# Patient Record
Sex: Female | Born: 1945 | Race: Asian | Hispanic: No | Marital: Married | Smoking: Never smoker
Health system: Southern US, Community
[De-identification: ages and names within clinical notes are randomized; demographics above are authoritative.]

## PROBLEM LIST (undated history)

## (undated) DIAGNOSIS — H2100 Hyphema, unspecified eye: Secondary | ICD-10-CM

## (undated) DIAGNOSIS — K7460 Unspecified cirrhosis of liver: Secondary | ICD-10-CM

## (undated) DIAGNOSIS — I4819 Other persistent atrial fibrillation: Secondary | ICD-10-CM

## (undated) DIAGNOSIS — B181 Chronic viral hepatitis B without delta-agent: Secondary | ICD-10-CM

## (undated) DIAGNOSIS — B1910 Unspecified viral hepatitis B without hepatic coma: Secondary | ICD-10-CM

## (undated) DIAGNOSIS — M810 Age-related osteoporosis without current pathological fracture: Secondary | ICD-10-CM

## (undated) DIAGNOSIS — L237 Allergic contact dermatitis due to plants, except food: Secondary | ICD-10-CM

## (undated) DIAGNOSIS — M1990 Unspecified osteoarthritis, unspecified site: Secondary | ICD-10-CM

## (undated) DIAGNOSIS — D219 Benign neoplasm of connective and other soft tissue, unspecified: Secondary | ICD-10-CM

## (undated) DIAGNOSIS — R7303 Prediabetes: Secondary | ICD-10-CM

## (undated) HISTORY — PX: RADIOFREQUENCY ABLATION: SHX2290

## (undated) HISTORY — DX: Chronic viral hepatitis B without delta-agent: B18.1

## (undated) HISTORY — DX: Allergic contact dermatitis due to plants, except food: L23.7

## (undated) HISTORY — DX: Hyphema, unspecified eye: H21.00

## (undated) HISTORY — DX: Unspecified cirrhosis of liver: K74.60

## (undated) HISTORY — DX: Unspecified viral hepatitis B without hepatic coma: B19.10

## (undated) HISTORY — DX: Age-related osteoporosis without current pathological fracture: M81.0

## (undated) HISTORY — DX: Prediabetes: R73.03

## (undated) HISTORY — DX: Benign neoplasm of connective and other soft tissue, unspecified: D21.9

---

## 2000-04-02 ENCOUNTER — Other Ambulatory Visit: Admission: RE | Admit: 2000-04-02 | Discharge: 2000-04-02 | Payer: Self-pay | Admitting: Obstetrics and Gynecology

## 2001-03-18 ENCOUNTER — Other Ambulatory Visit: Admission: RE | Admit: 2001-03-18 | Discharge: 2001-03-18 | Payer: Self-pay | Admitting: Obstetrics and Gynecology

## 2002-07-19 ENCOUNTER — Other Ambulatory Visit: Admission: RE | Admit: 2002-07-19 | Discharge: 2002-07-19 | Payer: Self-pay | Admitting: Obstetrics and Gynecology

## 2003-07-27 ENCOUNTER — Other Ambulatory Visit: Admission: RE | Admit: 2003-07-27 | Discharge: 2003-07-27 | Payer: Self-pay | Admitting: Obstetrics and Gynecology

## 2004-01-11 ENCOUNTER — Other Ambulatory Visit: Admission: RE | Admit: 2004-01-11 | Discharge: 2004-01-11 | Payer: Self-pay | Admitting: Obstetrics and Gynecology

## 2005-01-21 ENCOUNTER — Other Ambulatory Visit: Admission: RE | Admit: 2005-01-21 | Discharge: 2005-01-21 | Payer: Self-pay | Admitting: *Deleted

## 2006-04-15 ENCOUNTER — Other Ambulatory Visit: Admission: RE | Admit: 2006-04-15 | Discharge: 2006-04-15 | Payer: Self-pay | Admitting: Obstetrics and Gynecology

## 2007-04-21 ENCOUNTER — Other Ambulatory Visit: Admission: RE | Admit: 2007-04-21 | Discharge: 2007-04-21 | Payer: Self-pay | Admitting: Obstetrics and Gynecology

## 2007-08-06 ENCOUNTER — Ambulatory Visit: Payer: Self-pay | Admitting: Internal Medicine

## 2007-11-19 ENCOUNTER — Ambulatory Visit: Payer: Self-pay | Admitting: Internal Medicine

## 2008-05-23 ENCOUNTER — Ambulatory Visit: Payer: Self-pay | Admitting: Internal Medicine

## 2008-05-24 ENCOUNTER — Other Ambulatory Visit: Admission: RE | Admit: 2008-05-24 | Discharge: 2008-05-24 | Payer: Self-pay | Admitting: Obstetrics and Gynecology

## 2008-05-31 ENCOUNTER — Ambulatory Visit: Payer: Self-pay

## 2009-12-31 DIAGNOSIS — R5383 Other fatigue: Secondary | ICD-10-CM

## 2009-12-31 DIAGNOSIS — R5381 Other malaise: Secondary | ICD-10-CM

## 2009-12-31 DIAGNOSIS — I4891 Unspecified atrial fibrillation: Secondary | ICD-10-CM

## 2010-01-01 ENCOUNTER — Ambulatory Visit: Payer: Self-pay | Admitting: Internal Medicine

## 2010-01-03 ENCOUNTER — Encounter: Payer: Self-pay | Admitting: Internal Medicine

## 2010-01-10 ENCOUNTER — Encounter: Payer: Self-pay | Admitting: Internal Medicine

## 2010-01-10 ENCOUNTER — Ambulatory Visit (HOSPITAL_COMMUNITY): Admission: RE | Admit: 2010-01-10 | Discharge: 2010-01-10 | Payer: Self-pay | Admitting: Internal Medicine

## 2010-01-10 ENCOUNTER — Ambulatory Visit: Payer: Self-pay

## 2010-01-10 ENCOUNTER — Ambulatory Visit: Payer: Self-pay | Admitting: Internal Medicine

## 2010-01-15 ENCOUNTER — Telehealth: Payer: Self-pay | Admitting: Internal Medicine

## 2010-01-22 ENCOUNTER — Telehealth (INDEPENDENT_AMBULATORY_CARE_PROVIDER_SITE_OTHER): Payer: Self-pay | Admitting: *Deleted

## 2010-01-23 ENCOUNTER — Ambulatory Visit: Payer: Self-pay | Admitting: Internal Medicine

## 2010-01-29 ENCOUNTER — Encounter: Payer: Self-pay | Admitting: Internal Medicine

## 2010-01-29 ENCOUNTER — Ambulatory Visit: Payer: Self-pay

## 2010-01-30 ENCOUNTER — Ambulatory Visit: Payer: Self-pay | Admitting: Internal Medicine

## 2010-01-30 ENCOUNTER — Ambulatory Visit (HOSPITAL_COMMUNITY): Admission: RE | Admit: 2010-01-30 | Discharge: 2010-01-30 | Payer: Self-pay | Admitting: Internal Medicine

## 2010-02-22 ENCOUNTER — Ambulatory Visit: Payer: Self-pay | Admitting: Internal Medicine

## 2010-03-14 ENCOUNTER — Encounter: Admission: RE | Admit: 2010-03-14 | Discharge: 2010-03-14 | Payer: Self-pay | Admitting: Gastroenterology

## 2010-06-20 ENCOUNTER — Ambulatory Visit: Payer: Self-pay | Admitting: Gastroenterology

## 2010-07-05 ENCOUNTER — Telehealth: Payer: Self-pay | Admitting: Internal Medicine

## 2010-08-20 ENCOUNTER — Ambulatory Visit: Payer: Self-pay | Admitting: Internal Medicine

## 2010-09-12 ENCOUNTER — Ambulatory Visit (HOSPITAL_COMMUNITY): Admission: RE | Admit: 2010-09-12 | Discharge: 2010-09-12 | Payer: Self-pay | Admitting: Gastroenterology

## 2010-09-19 ENCOUNTER — Ambulatory Visit: Payer: Self-pay | Admitting: Gastroenterology

## 2010-12-08 LAB — CONVERTED CEMR LAB
BUN: 21 mg/dL (ref 6–23)
Basophils Relative: 0.1 % (ref 0.0–3.0)
Chloride: 103 meq/L (ref 96–112)
Eosinophils Relative: 0.6 % (ref 0.0–5.0)
GFR calc non Af Amer: 89.4 mL/min (ref >60)
HCT: 41.4 % (ref 36.0–46.0)
Hemoglobin: 14 g/dL (ref 12.0–15.0)
Lymphs Abs: 0.7 10*3/uL (ref 0.7–4.0)
MCV: 103.1 fL — ABNORMAL HIGH (ref 78.0–100.0)
Monocytes Absolute: 0.3 10*3/uL (ref 0.1–1.0)
Monocytes Relative: 10.1 % (ref 3.0–12.0)
Neutro Abs: 2.4 10*3/uL (ref 1.4–7.7)
Platelets: 170 10*3/uL (ref 150.0–400.0)
Potassium: 3.8 meq/L (ref 3.5–5.1)
RBC: 4.01 M/uL (ref 3.87–5.11)
Sodium: 142 meq/L (ref 135–145)
WBC: 3.4 10*3/uL — ABNORMAL LOW (ref 4.5–10.5)

## 2010-12-10 NOTE — Progress Notes (Signed)
Summary: PT RETURNING CALL  Phone Note Call from Patient Call back at [facility] Phone [phone]   Caller: Patient Summary of Call: PT RETURNING CALL  ABOUT TSH 0.982 Initial call taken by: Judie Grieve,  January 15, 2010 12:04 PM  Follow-up for Phone Call        She feels like the Atenolol is working for her but she feels like her HR is still fast at times. She cannot check it. I will set her up for a Nurse Visit on 3/16 (same day as her lab work) for an EKG. The nurse at that time can s/w Dr. Graciela Husbands to see if we need to increase her Atenolol dose. Follow-up by: Duncan Dull, RN, BSN,  January 15, 2010 12:27 PM

## 2010-12-10 NOTE — Progress Notes (Signed)
Summary: refill  Phone Note Refill Request Call back at Home Phone [phone] Call back at [phone]- hubsand work #    Refills Requested: Medication #1:  ATENOLOL 25 MG TABS once daily   [facility] [employer]   Method Requested: Electronic Initial call taken by: Migdalia Dk,  January 15, 2010 10:05 AM    Prescriptions: ATENOLOL 25 MG TABS (ATENOLOL) once daily  #30 x 1   Entered by:   Judithe Modest CMA   Authorized by:   Nathen May, MD, [facility]   Signed by:   Judithe Modest CMA on 01/15/2010   Method used:   Electronically to        [facility] [address] [phone]* (retail)       [address]       [place], [place]  [address]       Ph: [phone] or [phone]       Fax: [phone]   RxID:   [id number]

## 2010-12-10 NOTE — Progress Notes (Signed)
  Spoke with Pt. this Morning she has signed ROI, and will be here tomorrow for her appt and to also pick up a copy of her Echo, I told her it will be @ the [facility] when she checks in  [facility]  January 22, 2010 8:42 AM

## 2010-12-10 NOTE — Assessment & Plan Note (Signed)
Summary: EKG (427.31)  Nurse Visit   Vital Signs:  Patient profile:   65 year old female Pulse rate:   76 / minute  Vitals Entered By: Julieta Gutting, RN, BSN (January 23, 2010 9:40 AM)  Impression & Recommendations:  Problem # 1:  ATRIAL FIBRILLATION (ICD-427.31) Pt came into the office today for an EKG.  The pt was started on Atenolol 25mg  once a day and EKG was done to assess heart rate.  EKG shows AFIB with rate of 76.  I instructed the pt to continue Atenolol at this time.  A long-term Rx was called into the pharmacy #30, refill x6.  Julieta Gutting, RN, BSN  January 23, 2010 9:41 AM    Allergies: No Known Drug Allergies

## 2010-12-10 NOTE — Assessment & Plan Note (Signed)
Summary: eph/ gd   Primary Provider:  Dr Perrin Maltese  CC:  eph/cardioversion.  History of Present Illness:   Mrs. Connie Chase is seen in followup for paroxysmal atrial fibrillation. She also has a history of orthostatic intolerance.      she recently underwent cardioversion and she feels much better. He has improved energy.   thromboembolic risk factors are notable for gender and hypertension  and bordrline age  Current Medications (verified): 1)  Multivitamins   Tabs (Multiple Vitamin) .Marland Kitchen.. 1 Tab Once Daily 2)  I Caps .Marland Kitchen.. 1 Tab Once Daily 3)  Vitamin C 500 Mg  Tabs (Ascorbic Acid) .Marland Kitchen.. 1 Tab Bid 4)  Magnesium 250mg  .... 1 Once Daily 5)  Fish Oil   Oil (Fish Oil) .Marland Kitchen.. 1200 Mg 1 Tab Daily 6)  Atenolol 25 Mg Tabs (Atenolol) .... Once Daily 7)  Pradaxa 150 Mg Caps (Dabigatran Etexilate Mesylate) .... Two Times A Day  Allergies (verified): No Known Drug Allergies  Past History:  Past Medical History: Last updated: Jan 24, 2010 Current Problems:  ATRIAL FIBRILLATION (ICD-427.31) FATIGUE (ICD-780.79) RIGHT LOWER EXTREMITY EDEMA  Past Surgical History: Last updated: 24-Jan-2010 None  Family History: Last updated: 01-24-10 Father died at age 57 ruptured appendix  Social History: Last updated: 02/20/2010  She does not use cigarettes,  alcohol or recreational drugs.  She exercises     Vital Signs:  Patient profile:   65 year old female Height:      63 inches Weight:      99 pounds BMI:     17.60 Pulse rate:   64 / minute Pulse rhythm:   regular BP sitting:   96 / 64  (left arm) Cuff size:   regular  Vitals Entered By: Judithe Modest CMA (February 22, 2010 10:10 AM)  Physical Exam  General:  The patient was alert and oriented in no acute distress. HEENT Normal.  Neck veins were flat, carotids were brisk.  Lungs were clear.  Heart sounds were regular without murmurs or gallops.  Abdomen was soft with active bowel sounds. There is no clubbing cyanosis or edema. Skin Warm and  dry    EKG  Procedure date:  02/22/2010  Findings:      sinus rhythm at 64 Intervals 0.13/0.08/0.40 Axis XLVI   Impression & Recommendations:  Problem # 1:  ATRIAL FIBRILLATION (ICD-427.31) She is holding sinus rhythm. We spent a 5 minute visit discussing atrial fibrillation is risk of recurrence the role of thromboembolic risk reduction. She has borderline CHADS VASC score of 2-3 based on her history of hypertension and her age of 65. It is my recommendation that she stay on Pradaxa indefinitely. She can stop it as needed for procedures for example her dental work that is forthcoming. We will see her again in 6 months The following medications were removed from the medication list:    Aspirin 81 Mg Tbec (Aspirin) .Marland Kitchen... Take one tablet by mouth daily Her updated medication list for this problem includes:    Atenolol 25 Mg Tabs (Atenolol) ..... Once daily  Orders: EKG w/ Interpretation (93[address])  Patient Instructions: 1)  Your physician recommends that you continue on your current medications as directed. Please refer to the Current Medication list given to you today. 2)  Your physician wants you to follow-up in:  6 months with Dr Graciela Husbands. You will receive a reminder letter in the mail two months in advance. If you don't receive a letter, please call our office to schedule the follow-up appointment.

## 2010-12-10 NOTE — Assessment & Plan Note (Signed)
Summary: f2y/ gd   Visit Type:  Follow-up 2years Primary Provider:  Dr Perrin Maltese  CC:  palpitations.  History of Present Illness:   Connie Chase is seen in followup for paroxysmal atrial fibrillation. She also has a history of orthostatic intolerance.  she thinks that she has been out of rhythm for about 6 months. This is associated with fatigue and some impairment in exercise tolerance. She has also had problems with exercise associated lightheadedness with at least on one occasion on the treadmill having to come off because "her legs got tired but she got very lightheaded.  thromboembolic risk factors are notable for gender and hypertension. Previous assessments of left ventricular function were normal  from old records.   Current Medications (verified): 1)  Aspirin 81 Mg Tbec (Aspirin) .... Take One Tablet By Mouth Daily 2)  Multivitamins   Tabs (Multiple Vitamin) .Marland Kitchen.. 1 Tab Once Daily 3)  I Caps .Marland Kitchen.. 1 Tab Once Daily 4)  Vitamin C 500 Mg  Tabs (Ascorbic Acid) .Marland Kitchen.. 1 Tab Bid 5)  Magnesium 250mg  .... 1 Once Daily 6)  Fish Oil   Oil (Fish Oil) .Marland Kitchen.. 1200 Mg 1 Tab 4 Times A Week  Allergies (verified): No Known Drug Allergies  Past History:  Past Medical History: Last updated: 01-01-10 Current Problems:  ATRIAL FIBRILLATION (ICD-427.31) FATIGUE (ICD-780.79) RIGHT LOWER EXTREMITY EDEMA  Past Surgical History: Last updated: 01-01-10 None  Family History: Last updated: 01/01/10 Father died at age 39 ruptured appendix  Social History: Last updated: 2010/01/01  She does not use cigarettes,  alcohol or recreational drugs.  She exercises,.      Vital Signs:  Patient profile:   65 year old female Height:      63 inches Weight:      101 pounds BMI:     17.96 Pulse rate:   88 / minute BP sitting:   115 / 68  (left arm) Cuff size:   large  Vitals Entered By: Burnett Kanaris, CNA (January 01, 2010 10:15 AM)  Physical Exam  General:  The patient was alert and oriented in  no acute distress.Neck veins were flat, carotids were brisk. Lungs were clear. Heart sounds were irregular without murmurs or gallops. Abdomen was soft with active bowel sounds. There is no clubbing cyanosis or edema.    EKG  Procedure date:  01/01/2010  Findings:      atrial fibrillation at 88 Intervals-/0.08/0.36 Axis is 21  Impression & Recommendations:  Problem # 1:  ATRIAL FIBRILLATION (ICD-427.31) the patient has recurrent atrial fibrillation that has now been persistent for what sounds like about 6 months. We will plan to undertake cardioversion. She'll need anticoagulation in anticipation of this and we discussed the role of Pradaxa. She would prefer that she is a big vegetable eater. We discussed the fact that there are no good reversing agent.  We will need to reassess her left ventricular function and left atrial size to understand what the treatment options would be for prevention of recurrent atrial fibrillation. We'll obtain a 2-D echo cardiogram. Her graft her atrial fibrillation rates are modestly rapid with 88 beats per minute at rest. While she has some symptoms with exertion I suspect this is related not to paroxysms but her rate. We will give her a prescription for atenolol 25 and Inderal LA 80 to see if we can find some rate controlling agent. She did not tolerate metoprolol in the past. Will also stop her aspirin. Her updated medication list for this problem  includes:    Aspirin 81 Mg Tbec (Aspirin) .Marland Kitchen... Take one tablet by mouth daily    Atenolol 25 Mg Tabs (Atenolol) ..... Once daily  Orders: Echocardiogram (Echo)  Patient Instructions: 1)  Your physician has recommended you make the following change in your medication: STOP ASPIRIN 2)   START ATENOLOL AND INDERAL AS INSTRUCTED. LET MELANIE, RN KNOW WHICH ONE WORKS FOR YOU SO WE CAN CALL IN REFILLS 3)  START PRADAXA 150MG  two times a day  4)  Your physician has requested that you have a Cardioversion IN 4 WEEKS.    Electrical cardioversion uses a jolt of electricity to your heart either through paddles or wired patches attached to your chest. This is a controlled, usually prescheduled, procedure. This procedure is done at the hospital and you are not awake during the procedure.  You usually go home the day of the procedure. Please see the instruction sheet given to you today for more information. 5)  THE DAY BEFORE YOUR CARDIOVERSION YOU NEED TO COME INTO THE OFFICE FOR A NURSE VISIT FOR AN EKG TO MAKE SURE YOU ARE STILL IN ATRIAL FIBRILLATION. 6)  You have been diagnosed with atrial fibrillation.  Atrial fibrillation is a condition in which one of the upper chambers of the heart has extra electrical cells causing it to beat very fast.  Please see the handout/brochure given to you today for further information. 7)  Your physician has requested that you have an echocardiogram.  Echocardiography is a painless test that uses sound waves to create images of your heart. It provides your doctor with information about the size and shape of your heart and how well your heart's chambers and valves are working.  This procedure takes approximately one hour. There are no restrictions for this procedure. Prescriptions: PRADAXA 150 MG CAPS (DABIGATRAN ETEXILATE MESYLATE) two times a day  #60 x 6   Entered by:   Duncan Dull, RN, BSN   Authorized by:   Nathen May, MD, [facility]   Signed by:   Duncan Dull, RN, BSN on 01/01/2010   Method used:   Electronically to        [facility] [address] [phone]* (retail)       [address]       [place], [place]  [address]       Ph: [phone] or [phone]       Fax: [phone]   RxID:   [id number] ATENOLOL 25 MG TABS (ATENOLOL) once daily  #30 x 0   Entered by:   Duncan Dull, RN, BSN   Authorized by:   Nathen May, MD, [facility]   Signed by:   Duncan Dull, RN, BSN on 01/01/2010   Method used:   Electronically to        [facility]   [facility] [address] [phone]* (retail)       [address]       [place], [place]  [address]       Ph: [phone] or [phone]       Fax: [phone]   RxID:   [id number]

## 2010-12-10 NOTE — Assessment & Plan Note (Signed)
Summary: 6 month return.amber   Primary Provider:  Dr Perrin Maltese   History of Present Illness:    Mrs. Helbig is seen in followup for paroxysmal atrial fibrillation. This had  been quiescient She underwent cardioversion earlier in the calendar year; inmprovement in symptoms was clear with restoration of sinus rhythm  She also has a history of orthostatic intolerance.         thromboembolic risk factors are notable for gender  and   age; mCHADS VASC score is 2  Current Medications (verified): 1)  Multivitamins   Tabs (Multiple Vitamin) .Marland Kitchen.. 1 Tab Once Daily 2)  I Caps .Marland Kitchen.. 1 Tab Once Daily 3)  Vitamin C 500 Mg  Tabs (Ascorbic Acid) .Marland Kitchen.. 1 Tab Bid 4)  Magnesium 250mg  .... 1 Once Daily 5)  Fish Oil   Oil (Fish Oil) .Marland Kitchen.. 1200 Mg 1 Tab Daily 6)  Pradaxa 150 Mg Caps (Dabigatran Etexilate Mesylate) .... Two Times A Day  Allergies (verified): No Known Drug Allergies  Past History:  Past Medical History: Last updated: 2010/01/24 Current Problems:  ATRIAL FIBRILLATION (ICD-427.31) FATIGUE (ICD-780.79) RIGHT LOWER EXTREMITY EDEMA  Past Surgical History: Last updated: 01/24/10 None  Family History: Last updated: 01-24-2010 Father died at age 11 ruptured appendix  Social History: Last updated: 02/20/2010  She does not use cigarettes,  alcohol or recreational drugs.  She exercises     Vital Signs:  Patient profile:   65 year old female Height:      63 inches Weight:      100 pounds BMI:     17.78 Pulse rate:   77 / minute Resp:     16 per minute BP sitting:   127 / 69  (right arm)  Vitals Entered By: Marrion Coy, CNA (August 20, 2010 4:06 PM)  Physical Exam  General:  The patient was alert and oriented in no acute distress. HEENT Normal.  Neck veins were flat, carotids were brisk.  Lungs were clear.  Heart sounds were regular without murmurs or gallops.  Abdomen was soft with active bowel sounds. There is no clubbing cyanosis or edema. Skin Warm and  dry    EKG  Procedure date:  08/20/2010  Findings:      sinus rhythm at 77 next uncontrolled 0.13/08/ 3 8 Axis is 83  Impression & Recommendations:  Problem # 1:  ATRIAL FIBRILLATION (ICD-427.31) the patient is no longer taking her atenolol. Her blood pressures have been well controlled.  She has not had any symptomatic atrial fibrillation. We had a lengthy greater than 25 minute discussion regarding atrial fibrillation treatment options and anticoagulation. With the CHADS VASC score of 2 her thromboembolic risks would be estimated to be approximately 2-3% putting her non low risk group.  we discussed the potential issue of anticoagulation discontinuation following ablation and the debated status of that conclusion The following medications were removed from the medication list:    Atenolol 25 Mg Tabs (Atenolol) ..... Once daily  Orders: EKG w/ Interpretation (93[address])  Patient Instructions: 1)  Your physician recommends that you continue on your current medications as directed. Please refer to the Current Medication list given to you today. 2)  Your physician wants you to follow-up in: 6 months  You will receive a reminder letter in the mail two months in advance. If you don't receive a letter, please call our office to schedule the follow-up appointment.

## 2010-12-10 NOTE — Progress Notes (Signed)
Summary: lab work  Phone Note Call from Patient Call back at [employer] [phone]   Caller: Patient Reason for Call: Talk to Nurse Summary of Call: TSH was last drawn in June 08- 0.982 normal range 0.35-5.5, having lab work due 3/16 Initial call taken by: Migdalia Dk,  January 15, 2010 10:03 AM  Follow-up for Phone Call        Called patient and left message on machine. Pt will call if she has any questions. S/W Victorino Dike and she got the impression that the pt was just calling to let Dr. Graciela Husbands know this information. No question per say.  Follow-up by: Duncan Dull, RN, BSN,  January 15, 2010 10:42 AM

## 2010-12-10 NOTE — Progress Notes (Signed)
Summary: pt needs 90day supply  Phone Note Refill Request Call back at Home Phone [phone] Message from:  [facility] [address]  Refills Requested: Medication #1:  PRADAXA 150 MG CAPS two times a day. 90days supply  Initial call taken by: Omer Jack,  July 05, 2010 9:25 AM  Follow-up for Phone Call       Follow-up by: Judithe Modest CMA,  July 05, 2010 9:59 AM    Prescriptions: PRADAXA 150 MG CAPS (DABIGATRAN ETEXILATE MESYLATE) two times a day  #180 x 3   Entered by:   Judithe Modest CMA   Authorized by:   Nathen May, MD, [facility]   Signed by:   Judithe Modest CMA on 07/05/2010   Method used:   Electronically to        [facility]  [phone]* (retail)       [address] [place], [place]  [address]       Ph: [phone] or [phone]       Fax: [phone]   RxID:   [id number] PRADAXA 150 MG CAPS (DABIGATRAN ETEXILATE MESYLATE) two times a day  #180 x 3   Entered by:   Judithe Modest CMA   Authorized by:   Nathen May, MD, [facility]   Signed by:   Judithe Modest CMA on 07/05/2010   Method used:   Electronically to        [facility]  [facility] [address] [phone]* (retail)       [address]       [place], [place]  [address]       Ph: [phone] or [phone]       Fax: [phone]   RxID:   [id number]

## 2010-12-10 NOTE — Letter (Signed)
Summary: Cardioversion/TEE Instructions  Architectural technologist, Main Office  [address]   [place], [place] [address]   Phone: [phone]  Fax: [phone]    Cardioversion Instructions  You are scheduled for a Cardioversion  on 01/30/10 with Dr. Graciela Husbands.   Please arrive at the [facility] at 0630 a.m.  on the day of your procedure.  1)   DIET:  A)   Nothing to eat or drink after midnight except your medications with a sip of water.   2)   Come to the [place] office on 01/23/10 for lab work. The lab at [facility] is open from 8:30 a.m. to 1:30 p.m. and 2:30 p.m. to 5:00 p.m. The lab at [facility] is open from 7:30 a.m. to 5:30 p.m. You do not have to be fasting.  3)   MAKE SURE YOU TAKE YOUR PRADAXA.  4)   A)   YOU MAY TAKE ALL of your remaining medications with a small amount of water.    5)  Must have a responsible person to drive you home.  6)   Bring a current list of your medications and current insurance cards.   * Special Note:  Every effort is made to have your procedure done on time. Occasionally there are emergencies that present themselves at the hospital that may cause delays. Please be patient if a delay does occur.  * If you have any questions after you get home, please call the office at [phone].

## 2010-12-26 ENCOUNTER — Ambulatory Visit: Payer: Medicare Other | Admitting: Gastroenterology

## 2010-12-26 DIAGNOSIS — B181 Chronic viral hepatitis B without delta-agent: Secondary | ICD-10-CM

## 2011-02-03 LAB — PROTIME-INR: INR: 1.15 (ref 0.00–1.49)

## 2011-02-03 LAB — APTT: aPTT: 38 seconds — ABNORMAL HIGH (ref 24–37)

## 2011-03-11 HISTORY — PX: CATARACT EXTRACTION W/ INTRAOCULAR LENS IMPLANT: SHX1309

## 2011-03-25 NOTE — Letter (Signed)
August 06, 2007    Connie Chase, M.D.  [address]  [place], [place] [address]   RE:  Connie, Chase  MRN:  [id number]  /  DOB:  [birth year]   Dear Connie Chase:   It was a pleasure to see Connie Chase at your request because of atrial  fibrillation.   As you know, she is a 65 year old [place] immigrant who is a married  mother of 2 who was found to be in atrial fibrillation at her annual  physical with Connie Chase in June.  She had been complaining of some  fatigue at that point.  She was referred to you.  Evaluation reveals  what is likely a typo in that her ejection fraction is described at 34%,  but her left ventricular systolic function is described as normal.  Her  echocardiogram demonstrated an ejection fraction of 50% to 55%.  There  was some suggestions of a PFO, but this was not confirmed by bubble  study.  She was begun on Coumadin with anticipation of cardioversion,  and she spontaneously reverted to sinus rhythm.   She was treated, in addition, with metoprolol that was associated with  orthostatic intolerance.  This was discontinued.  She was then treated  with Cardizem which was associated with severe, unremitting headache.   Her thromboembolic risk factors are negative for diabetes, hypertension,  congestive heart failure, prior stroke, and age.  While the chart  describes borderline diabetes, apparently her last nonfasting blood  sugar was 100.   She has no exercise intolerance.   The fatigue with which she presented to Connie Chase in the spring seemed  to abate somewhat as the summer went on.  It then worsened.  It is not  clear whether the medications have abrogated whatever benefit was  initially seen.   PAST MEDICAL HISTORY:  Notable, primarily, as above.  She has a history  of allergies.   PAST SURGICAL HISTORY:  Negative.   SOCIAL HISTORY:  As noted previously.  She does not use cigarettes,  alcohol or recreational drugs.  She exercises,.   She takes Coumadin and Cartia currently.  She has no known drug  allergies.   PHYSICAL EXAMINATION:  She is a middle-to-older Asian woman who looks  older than her stated age of 11.  Her blood pressure is 111/69, her pulse is 76, and there is no  orthostatic change.  Her HEENT exam demonstrated no icterus or xanthoma.  The neck veins were flat.  The carotids were brisk and full bilaterally  without bruits.  BACK:  Without kyphosis or scoliosis.  LUNGS:  Clear.  Heart sounds were regular with an early systolic murmur and an S4.  The abdomen was soft with active bowel sounds without midline pulsation  or hepatomegaly.  Her femoral pulses were 2+.  Distal pulses were intact.  There is no  clubbing, cyanosis, or edema.  The neurological exam was grossly normal.  Her skin was warm and dry, and she is wearing a pair of socks, one black  and one blue.   Electrocardiogram dated today demonstrated sinus rhythm at 76 with  intervals of 0.14/0.08/0.39.  Electrocardiogram was otherwise normal.   IMPRESSION:  1. Paroxysmal atrial fibrillation with a moderately rapid ventricular      response.  2. Orthostatic intolerance with beta blockers.  3. Severe headache with calcium blockers.  4. Thromboembolic risk factors broadly negative with a [place] score of      zero.  Connie Chase, Connie Chase has paroxysmal atrial fibrillation in the setting of  what appears to be a structurally normal heart and a [place] score of zero.  Given the symptoms associated with her medications and her concerns  about Coumadin, I think it is reasonable:  A.  To stop the Cardizem.  B.  To stop the Coumadin.   I have told her that with a [place] score of zero, it is reasonable to take  some dose of aspirin.  We do not know the proper dose, and she is  concerned about being petite, so I agreed with her that 162 mg was  reasonable.   I have given her a prescription for verapamil 40 to use as needed if she  were to have symptomatic  tachy palpitations, although this will be  difficult because on her event recorder when she was having  palpitations, for example, after eating dark chocolate, her monitor,  apparently, was normal.   We further discussed ablation, and I told her that consideration of  ablation at this point was premature.  In the event that her symptoms  become greater, antiarrhythmic drug therapy would be a reasonable thing  in this younger lady, and this would almost certainly be appropriate  prior to referral for a junction ablation.   Thank you for the consultation.    Sincerely,      Connie Salvia, MD, [facility]  Electronically Signed    SCK/MedQ  DD: 08/06/2007  DT: 08/07/2007  Job #: [id number]   CC:    Connie Chase, M.D.

## 2011-03-25 NOTE — Letter (Signed)
May 23, 2008    Duncan Dull, MD  [address]  [address]  [place], [place] [address]   RE:  Connie Chase, Connie Chase  MRN:  [id number]  /  DOB:  [birth year]   Dear Lupita Leash,   Connie Chase comes in today with multiple concerns.  She said that she was  found to be in atrial fibrillation when she was in your office last  week.  If you would not mind, please be kind enough to fax me a copy of  that ECG, that would be a big help.   She also is concerned about her lab work, which demonstrated mildly  elevated CRP and a mildly depressed white blood count.  She is having  follow up of this blood work.   She has episodes of fatigue.  She is not sure whether this correlates  with her paroxysms of atrial fibrillation or not.   Her other concern is swelling in her lower right leg.  You had ordered  venous Doppler; she cancelled it because she was not sure when she was  going overboard.  She now requests that that be repeated.   PHYSICAL EXAMINATION:  VITAL SIGNS:  Today, her blood pressure was  108/64 with a pulse of 86.  LUNGS:  Clear.  HEART:  Sounds were regular.  EXTREMITIES:  Without edema.  There was some mild swelling of the right  lower leg.   Electrocardiogram dated today demonstrated sinus rhythm at 86 with  intervals of 0.14/0.08/0.37.  The axis was 70 degrees.   IMPRESSION:  1. Paroxysmal atrial fibrillation.  2. Right lower extremity swelling.  3. Episodes of fatigue, question correlated with paroxysmal atrial      fibrillation.  4. Lab work demonstrated a mildly elevated CRP at 4.6 (ULN:3), HDL 99,      WBC 2.7.   PLAN:  Connie Chase atrial fibrillation is largely asymptomatic.  I think,  it may well be if we can confirm atrial fibrillation that an event  recorder would be useful to try to correlate her fatigue with her atrial  fibrillation.   We will get the venous Dopplers and I will forward the results of those  two.    Sincerely,      Duke Salvia, MD, [facility]  Electronically Signed    SCK/MedQ  DD: 05/23/2008  DT: 05/24/2008  Job #: [id number]

## 2011-03-25 NOTE — Letter (Signed)
November 19, 2007    Duncan Dull, M.D.  [address]  [address]  [place], [place] [address]   RE:  Connie Chase, Connie Chase  MRN:  [id number]  /  DOB:  06/25/1946   Dear Lupita Leash:   Happy new year to you!  Mrs. Pflieger comes back in followup for atrial  fibrillation.  There were some concerns that she had about the  cardiology office that prompted her to reschedule here, and I have asked  her to plan to talk with you about this in March.   The long and short of it is that she is much better off of her [place].  She is also off of the Coumadin as you know.  There is some discussion  about her aspirin dose, and I appreciate your getting her back on daily  aspirin and, for her, the dose of 81 versus 162 is a big deal, and I am  not sure whether it matters a whole lot with a [place] score of 0, so I  compromised and left her at 81 mg a day.   She is concerned about her blood pressure.  It was 130 the other day  somewhere, and it was 140 repeatedly here in the office today.  Her exam  was otherwise normal, and her heart rate was 74.   LUNGS:  Clear.  HEART:  Sounds are regular.  Rate was 74.  EXTREMITIES:  Normal.   IMPRESSION:  1. Atrial fibrillation.  2. Borderline blood pressure.   I did a quick literature review on aspirin and cinnamon, both of which  are new to her.  Neither one of them has been associated with  hypertension.  In fact, both have been associated with treatment of  hypertension.   There are data from [place] that irbesartan has been associated with  decreased paroxysms of atrial fibrillation, so if she needs hypertensive  therapy, I would probably choose irbesartan.  Pending her discussions  with you about long-term followup, I have set her up to see me in about  6 months, which is written in pencil.  If there is anything else I can  do, please do not hesitate to contact me.    Sincerely,      Duke Salvia, MD, [facility]  Electronically Signed    SCK/MedQ  DD:  11/19/2007  DT: 11/19/2007  Job #: [id number]

## 2011-03-27 ENCOUNTER — Other Ambulatory Visit: Payer: Self-pay | Admitting: Gastroenterology

## 2011-03-27 DIAGNOSIS — B181 Chronic viral hepatitis B without delta-agent: Secondary | ICD-10-CM

## 2011-04-03 ENCOUNTER — Ambulatory Visit (HOSPITAL_COMMUNITY)
Admission: RE | Admit: 2011-04-03 | Discharge: 2011-04-03 | Disposition: A | Discharge status: Home / Self Care | Payer: BC Managed Care – PPO | Source: Ambulatory Visit | Attending: Gastroenterology | Admitting: Gastroenterology

## 2011-04-03 DIAGNOSIS — B1910 Unspecified viral hepatitis B without hepatic coma: Secondary | ICD-10-CM | POA: Insufficient documentation

## 2011-04-03 DIAGNOSIS — Q619 Cystic kidney disease, unspecified: Secondary | ICD-10-CM | POA: Insufficient documentation

## 2011-04-03 DIAGNOSIS — B181 Chronic viral hepatitis B without delta-agent: Secondary | ICD-10-CM

## 2011-05-08 ENCOUNTER — Other Ambulatory Visit: Payer: Self-pay | Admitting: Gastroenterology

## 2011-05-08 ENCOUNTER — Ambulatory Visit (INDEPENDENT_AMBULATORY_CARE_PROVIDER_SITE_OTHER): Payer: BC Managed Care – PPO | Admitting: Gastroenterology

## 2011-05-08 VITALS — BP 109/82 | HR 80 | Temp 96.9°F | Ht 63.5 in | Wt 103.0 lb

## 2011-05-08 DIAGNOSIS — B181 Chronic viral hepatitis B without delta-agent: Secondary | ICD-10-CM

## 2011-05-08 LAB — HEPATIC FUNCTION PANEL
Alkaline Phosphatase: 70 U/L (ref 39–117)
Indirect Bilirubin: 0.3 mg/dL (ref 0.0–0.9)
Total Bilirubin: 0.4 mg/dL (ref 0.3–1.2)

## 2011-05-08 LAB — PHOSPHORUS: Phosphorus: 3.6 mg/dL (ref 2.3–4.6)

## 2011-05-09 LAB — HEPATITIS B SURFACE ANTIGEN: Hepatitis B Surface Ag: POSITIVE — AB

## 2011-05-09 LAB — HEPATITIS B SURFACE ANTIBODY,QUALITATIVE: Hep B S Ab: NEGATIVE

## 2011-05-12 LAB — HEPATITIS B DNA, ULTRAQUANTITATIVE, PCR
Hepatitis B DNA (Calc): <116 copies/mL (ref <116)
Hepatitis B DNA: <20 IU/mL (ref <20)

## 2011-05-15 ENCOUNTER — Encounter: Payer: Self-pay | Admitting: Gastroenterology

## 2011-05-15 NOTE — Progress Notes (Signed)
NAME:  Connie Chase, Connie Chase    MR#:  [id number]      DATE:  05/08/2011  DOB:  [birth year]    cc: Consulting Physician:  Meredeth Ide, MD, [facility], [address], [address], [place], [place] [address], Fax [phone] Primary Care Physician:  Shaune Pollack, MD, [facility] [place]. [address], [place], [place] [address], Phone number [phone] Referring Physician:  Dorena Cookey, MD, [facility], [address], [address], [place], [place] [address], Fax [phone]    REASON FOR VISIT:  Followup genotype B, E antibody positive HBV.   history:  The patient returns today unaccompanied. Since last being seen on December 26, 2010, there are no symptoms referable to her history of  hepatitis B. There are no symptoms to suggest vasculitis or decompensated liver disease.   PAST MEDICAL HISTORY:  No interval change.  She had a cataract of her left eye removed successfully, and is awaiting surgery on her right eye.   CURRENT MEDICATIONS:  Tenofovir 300 mg p.o. daily, Pradaxa 150 mg p.o. b.i.d., multivitamin daily, I Caps 1 daily, fish oil 1200 mg p.o. b.i.d., vitamin C 500 mg  p.o. b.i.d., Citracal 315 mg daily, cinnamon one quarter teaspoon with filtered wire twice a day.    Allergies:  Denies.    habits:  Smoking never. Alcohol denies interval consumption.   REVIEW OF SYSTEMS:  All 10 systems reviewed today with the patient and they are negative other than which is mentioned above.   PHYSICAL EXAMINATION:   Constitutional:  Stated age without stigmata of chronic liver disease. Vital signs: Height 63.5 inches, weight 103 pounds down 2 pounds from previously, blood pressure 109/82, pulse of 80,  temperature 96.9 Fahrenheit.  Ears, nose, mouth and throat:  Unremarkable oropharynx.  No thyromegaly or neck masses.  Chest:  Resonant to percussion.  Clear to auscultation.  Cardiovascular:   Heart sounds normal S1, S2  without murmurs or rubs.  There is no peripheral edema.  Abdominal:  Normal bowel sounds.  No masses or tenderness.  I could not appreciate a liver edge or spleen tip.  I  could not appreciate any hernias.  Lymphatics:  No cervical or inguinal lymphadenopathy.  Central Nervous System:  No asterixis or focal neurologic findings.  Dermatologic:  Anicteric without palmar  erythema or spider angiomata.  Eyes:  Anicteric sclerae.  Pupils are equal and reactive to light.   Laboratories:  Recent ultrasound 03/27/2011 showed a normal-appearing liver, and there was no evidence of portal hypertension.  On 12/26/2010, hepatitis B surface antigen was positive, surface antibody negative, E antibody positive, E antigen negative, HBV DNA undetectable, ALT 34.   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 Pretreatment viral  load on 03/12/2010, was 1,024,570 international units per mL. Approximately 2 months into therapy her viral load was 250 international units per mL, on 09/19/2010 at week 27, her viral load was less than 20 and on 12/26/2010, at week 41, it was undetectable.  In terms of her hepatitis B care, she is A immune and she has been screened for hepatocellular cancer with the ultrasound on 04/03/2011, and would not need another one until November or December 2012.  In my discussion today with the patient, we reviewed her previous lab work. I reassured her that she is doing well on tenofovir.  Plan:  1. Standard labs today. 2. I confirmed that she has sufficient supply of tenofovir. 3. She should return in November 2012. 4. She is hepatitis A immune. 5. At the next appointment we will book her December 2012, semiannual ultrasound. 6. She wished to have the lab work mailed to her for review.             Brooke Dare, MD    ADDENDUM: Hep B e Ab positive.   ALT 43.  HBV DNA detectable but < 20 IU/mL.  Letter sent to patient describing results.   403 .[id number]  D:  Thu Jun 28 17:07:41 2012 ; T:  Thu Jun 28 19:42:08 2012  Job #:  [id number]

## 2011-05-16 ENCOUNTER — Encounter: Payer: Self-pay | Admitting: Gastroenterology

## 2011-05-27 ENCOUNTER — Encounter: Payer: Self-pay | Admitting: Gastroenterology

## 2011-06-10 ENCOUNTER — Telehealth: Payer: Self-pay | Admitting: Internal Medicine

## 2011-06-10 NOTE — Telephone Encounter (Signed)
Pt called and is having palpitations and arrhythmias.  Has had this for about 3 weeks.  Was not notified of recall appt. And wants to be seen.  Has stopped Pradaxa because of a eye surgery.  Feels very tired and washed out.

## 2011-06-10 NOTE — Telephone Encounter (Signed)
RN s/w Pt: palpitations for about 2-3 weeks; now feels like it is getting worse. Denies sob, dizziness or lightheadedness. Pt stopped Pradaxa in April for cataract surgery; however did not restart due to eye hemmorrhage. Pt reports having a Cardioversion in March/April 2011 and feels that she may be back in atrial fibrillation.  RN recommended for Pt to see Lorin Picket, [place] on Friday, 06/13/2011. Pt declined appt. Pt would like to speak with Dr Odessa Fleming nurse. RN informed Pt that Herbert Seta will return on Wednesday, 06/11/2011 and will follow up with her. Pt verbalizes understanding.

## 2011-06-11 NOTE — Telephone Encounter (Signed)
I spoke with the patient this morning. She states she feels like her heart has been out of rhythm for about 2 weeks. She was off Pradaxa since April, but did restart this about 3 days ago. She is not on any rate/rhythm controlling drugs. She states she was on a beta blocker in the past and she did not feel well on this. I explained to her that we should see her in the office to decide what the best course of treatment should be for her. She is agreeable to seeing Tereso Newcomer, PA on 8/3 since Dr. Graciela Husbands will be in the office.

## 2011-06-12 ENCOUNTER — Encounter: Payer: Self-pay | Admitting: *Deleted

## 2011-06-12 ENCOUNTER — Encounter: Payer: Self-pay | Admitting: Physician Assistant

## 2011-06-13 ENCOUNTER — Ambulatory Visit (INDEPENDENT_AMBULATORY_CARE_PROVIDER_SITE_OTHER): Payer: BC Managed Care – PPO | Admitting: Physician Assistant

## 2011-06-13 ENCOUNTER — Encounter: Payer: Self-pay | Admitting: Physician Assistant

## 2011-06-13 VITALS — BP 112/68 | HR 76 | Resp 16 | Ht 63.0 in | Wt 103.0 lb

## 2011-06-13 DIAGNOSIS — R079 Chest pain, unspecified: Secondary | ICD-10-CM | POA: Insufficient documentation

## 2011-06-13 DIAGNOSIS — R002 Palpitations: Secondary | ICD-10-CM

## 2011-06-13 DIAGNOSIS — I4891 Unspecified atrial fibrillation: Secondary | ICD-10-CM

## 2011-06-13 LAB — CBC WITH DIFFERENTIAL/PLATELET
Basophils Absolute: 0 10*3/uL (ref 0.0–0.1)
Basophils Relative: 0.6 % (ref 0.0–3.0)
Eosinophils Absolute: 0 10*3/uL (ref 0.0–0.7)
Hemoglobin: 14.4 g/dL (ref 12.0–15.0)
Lymphocytes Relative: 24.9 % (ref 12.0–46.0)
MCHC: 33.4 g/dL (ref 30.0–36.0)
MCV: 97.5 fl (ref 78.0–100.0)
Monocytes Absolute: 0.3 10*3/uL (ref 0.1–1.0)
Neutro Abs: 2.7 10*3/uL (ref 1.4–7.7)
RBC: 4.41 Mil/uL (ref 3.87–5.11)
RDW: 13.6 % (ref 11.5–14.6)

## 2011-06-13 LAB — BASIC METABOLIC PANEL
BUN: 22 mg/dL (ref 6–23)
CO2: 31 mEq/L (ref 19–32)
Calcium: 9.2 mg/dL (ref 8.4–10.5)
GFR: 108.43 mL/min (ref >60.00)
Glucose, Bld: 102 mg/dL — ABNORMAL HIGH (ref 70–99)
Potassium: 4.3 mEq/L (ref 3.5–5.1)
Sodium: 144 mEq/L (ref 135–145)

## 2011-06-13 NOTE — Assessment & Plan Note (Signed)
She seems to be describing recurrent atrial fibrillation.  She is in normal sinus rhythm today.  I recommend she proceed with an event monitor to capture her rhythm when she has palpitations.  I also recommend proceeding with a stress Myoview to rule out ischemic heart disease.  If she is having significant episodes of atrial fibrillation and her stress test is normal, we could consider whether or not to place her on an antiarrhythmic (class IC).  I will bring her back in followup with Dr. Graciela Husbands after the above testing.  We will obtain labs today to include a CBC, basic metabolic panel and TSH.

## 2011-06-13 NOTE — Patient Instructions (Signed)
Your physician recommends that you schedule a follow-up appointment in:4-6 weeks with Dr. Graciela Husbands  Your physician has requested that you have en exercise stress myoview. For further information please visit [email or web address] Please follow instruction sheet, as given.  Your physician has recommended that you wear an event monitor. Event monitors are medical devices that record the heart's electrical activity. Doctors most often [place] these monitors to diagnose arrhythmias. Arrhythmias are problems with the speed or rhythm of the heartbeat. The monitor is a small, portable device. You can wear one while you do your normal daily activities. This is usually used to diagnose what is causing palpitations/syncope (passing out).

## 2011-06-13 NOTE — Progress Notes (Signed)
History of Present Illness: Primary Electrophysiologist:  Dr. Sherryl Manges Ophthalmologist:  Dr. Delrae Sawyers  Connie Chase is a 65 y.o. female presents for evaluation of possible recurrent atrial fibrillation.  She underwent cardioversion in 3/11.  She had remained in sinus rhythm since that time and was last seen in 10/11.  Of note she does have a CHADS2-VASc score of 2 (age and gender).  She has been maintained on anticoagulation with Pradaxa.  She had cataract surgery in the spring.  She had a hemorrhage in one of her eyes.  She stopped taking the Pradaxa for some time after this.  It is unclear whether or not she discussed this with her ophthalmologist.  She tells me that no one ever told her that she should discontinue anti-coagulation permanently.  She may need another cataract procedure in the near future.  About 2-3 weeks ago, she started to feel fatigued again with an irregular heartbeat.  This reminded her of her previous atrial fibrillation. She's been to a couple of doctor's appointments where she was told she had an irregular heartbeat.  However, she did not have an EKG.  She feels a heaviness in her chest with her palpitations.  She denies orthopnea, PND or edema.  She denies significant shortness of breath.  She denies syncope.  She started taking Pradaxa again approximately one week ago when she thought she was back in atrial fibrillation.  Today she is in normal sinus rhythm on EKG.  Past Medical History  Diagnosis Date  . Atrial fibrillation     s/p DCCV in 3/11  . Other malaise and fatigue   . Lower extremity edema     Right  . Hepatitis B infection     Current Outpatient Prescriptions  Medication Sig Dispense Refill  . dabigatran (PRADAXA) 150 MG CAPS Take 150 mg by mouth 2 (two) times daily.        . Magnesium 250 MG TABS Take 1 tablet by mouth daily.        . Multiple Vitamin (MULTIVITAMIN) tablet Take 1 tablet by mouth daily.        . Multiple Vitamins-Minerals  (ICAPS) CAPS Take 1 capsule by mouth daily.        . Omega-3 Fatty Acids (FISH OIL) 1200 MG CAPS Take 1 capsule by mouth daily.        . vitamin C (ASCORBIC ACID) 500 MG tablet Take 500 mg by mouth daily.          Allergies: No Known Allergies  Social history:  Nonsmoker  Family history:  Negative for premature CAD  ROS:  Please see the history of present illness.  All other systems reviewed and negative.   Vital Signs: BP 112/68  Pulse 76  Resp 16  Ht 5\' 3"  (1.6 m)  Wt 103 lb (46.72 kg)  BMI 18.25 kg/m2  PHYSICAL EXAM: Well nourished, well developed, in no acute distress HEENT: normal Neck: no JVD Endocrine: No thyromegaly Vascular: No carotid bruits Cardiac:  normal S1, S2; RRR; no murmur Lungs:  clear to auscultation bilaterally, no wheezing, rhonchi or rales Abd: soft, nontender, no hepatomegaly Ext: no edema Skin: warm and dry Neuro:  CNs 2-12 intact, no focal abnormalities noted  EKG:  Sinus rhythm, heart rate 76, normal axis, no ischemic changes  ASSESSMENT AND PLAN:

## 2011-06-13 NOTE — Assessment & Plan Note (Signed)
As noted, she is in normal sinus rhythm today.  She does have somewhat of an elevated thromboembolic risk factor profile.  She is now back on Pradaxa.  She does not have a history of stroke.  She may need another cataract procedure in the future.  She can hold her Pradaxa 7 days prior and restart post op when felt to be safe by the surgeon.  She has been advised to discuss her medications with her ophthalmologist so that she is aware of her anticoagulant therapy.

## 2011-06-13 NOTE — Assessment & Plan Note (Signed)
Her chest pain is atypical.  However, as noted above, I will proceed with a Myoview study to rule out ischemic heart disease.

## 2011-06-16 ENCOUNTER — Encounter: Payer: Self-pay | Admitting: *Deleted

## 2011-06-17 ENCOUNTER — Telehealth: Payer: Self-pay | Admitting: Internal Medicine

## 2011-06-17 NOTE — Telephone Encounter (Signed)
Test results,  also copy of test results.

## 2011-06-18 NOTE — Telephone Encounter (Signed)
Pt rtn call 

## 2011-06-18 NOTE — Telephone Encounter (Signed)
I spoke with the patient about her lab results. I will mail a copy to her per her request. 

## 2011-06-18 NOTE — Telephone Encounter (Signed)
I spoke with the patient about her lab results. I will mail a copy to her per her request.

## 2011-06-18 NOTE — Telephone Encounter (Signed)
LMTC

## 2011-06-23 ENCOUNTER — Ambulatory Visit (HOSPITAL_COMMUNITY): Payer: BC Managed Care – PPO | Attending: Internal Medicine | Admitting: Radiology

## 2011-06-23 ENCOUNTER — Telehealth: Payer: Self-pay | Admitting: Physician Assistant

## 2011-06-23 ENCOUNTER — Encounter (INDEPENDENT_AMBULATORY_CARE_PROVIDER_SITE_OTHER): Payer: BC Managed Care – PPO

## 2011-06-23 DIAGNOSIS — I4891 Unspecified atrial fibrillation: Secondary | ICD-10-CM

## 2011-06-23 DIAGNOSIS — R0789 Other chest pain: Secondary | ICD-10-CM

## 2011-06-23 DIAGNOSIS — R002 Palpitations: Secondary | ICD-10-CM | POA: Insufficient documentation

## 2011-06-23 DIAGNOSIS — R079 Chest pain, unspecified: Secondary | ICD-10-CM | POA: Insufficient documentation

## 2011-06-23 MED ORDER — TECHNETIUM TC 99M TETROFOSMIN IV KIT
33.0000 | PACK | Freq: Once | INTRAVENOUS | Status: AC | PRN
  Administered 2011-06-23: 33 via INTRAVENOUS

## 2011-06-23 MED ORDER — TECHNETIUM TC 99M TETROFOSMIN IV KIT
11.0000 | PACK | Freq: Once | INTRAVENOUS | Status: AC | PRN
  Administered 2011-06-23: 11 via INTRAVENOUS

## 2011-06-23 MED ORDER — METOPROLOL SUCCINATE ER 25 MG PO TB24: 25.0000 mg | ORAL_TABLET | Freq: Every day | ORAL | Status: DC

## 2011-06-23 MED ORDER — ATENOLOL 25 MG PO TABS: 25.0000 mg | ORAL_TABLET | Freq: Every day | ORAL | Status: DC

## 2011-06-23 NOTE — Progress Notes (Addendum)
[facility] SITE 3 NUCLEAR MED [address] [place] [address] [phone]  Cardiology Nuclear Med Study  Connie Chase is a 65 y.o. female [id number] 02-20-1946   Nuclear Med Background Indication for Stress Test:  Evaluation for Ischemia History: 3/11 Cardioversion,3/11 Echo:EF=55-60% and 08 Myocardial Perfusion Study:normal per patient at [facility] Cardiology Cardiac Risk Factors: NA  Symptoms:  Chest Pressure.  (last date of chest discomfort 1 month ago), Fatigue and Palpitations   Nuclear Pre-Procedure Caffeine/Decaff Intake:  None NPO After: 4:00am   Lungs:  clear IV 0.9% NS with Angio Cath:  20g  IV Site: R Antecubital  IV Started by:  Stanton Kidney, EMT-P  Chest Size (in):  34 Cup Size: A  Height: 5\' 3"  (1.6 m)  Weight:  103 lb (46.72 kg)  BMI:  Body mass index is 18.25 kg/(m^2). Tech Comments: Tereso Newcomer PA-C consulted for AFib and results of stress test. Patient will to be seen on 8/15 by Tereso Newcomer PA for F/U and started Atenolol 25 mg daily.    Nuclear Med Study 1 or 2 day study: 1 day  Stress Test Type:  Stress  Reading MD: Olga Millers, MD  Order Authorizing Provider:  Brynda Rim  Resting Radionuclide: Technetium 64m Tetrofosmin  Resting Radionuclide Dose: 11.0 mCi   Stress Radionuclide:  Technetium 35m Tetrofosmin  Stress Radionuclide Dose: 33.0 mCi           Stress Protocol Rest HR: 70 Stress HR: 218  Rest BP: 102/76 Stress BP:127/80  Exercise Time (min): 9:00 METS: 10.1   Predicted Max HR: 155 bpm % Max HR: 140.65 bpm Rate Pressure Product: 21308   Dose of Adenosine (mg):  n/a Dose of Lexiscan: n/a mg  Dose of Atropine (mg): n/a Dose of Dobutamine: n/a mcg/kg/min (at max HR)  Stress Test Technologist: Cathlyn Parsons, RN  Nuclear Technologist:  Doyne Keel, CNMT     Rest Procedure:  Myocardial perfusion imaging was performed at rest 45 minutes following the intravenous administration of Technetium 51m  Tetrofosmin. Rest ECG: NSR  Stress Procedure:  The patient exercised for 9:00.  The patient stopped due to Rapid Atrial Fib at rate of 218 and was asymptomatic. In recovery patient converted to coarse AFib.There were no significant ST-T wave changes.  Technetium 52m Tetrofosmin was injected at peak exercise and myocardial perfusion imaging was performed after a brief delay. Patient continued in AFib with [facility] consulted. Stress ECG: No significant ST segment change suggestive of ischemia.  QPS Raw Data Images:  Acquisition technically good; normal left ventricular size. Stress Images:  There is mild decreased uptake in the inferoseptal wall. Rest Images:  There is mild decreased uptake in the inferoseptal wall. Subtraction (SDS):  No evidence of ischemia. Transient Ischemic Dilatation (Normal <1.22):  0.98 Lung/Heart Ratio (Normal <0.45):  0.26  Quantitative Gated Spect Images QGS EDV:NA QGS ESV:NA QGS cine images: NA QGS EF: Study not gated  Impression Exercise Capacity:  Good exercise capacity. BP Response:  Normal blood pressure response. Clinical Symptoms:  No chest pain. ECG Impression:  No diagnostic ST changes; patient developed atrial fibrillation during the study. Comparison with Prior Nuclear Study: No images to compare  Overall Impression:  Probably normal stress nuclear study with inferoseptal thinning but no ischemia.    Olga Millers    06/24/11 Please notify patient that Myoview is ok. No ischemia. Make sure she returns for follow as planned.  Tereso Newcomer, PA-C

## 2011-06-23 NOTE — Telephone Encounter (Signed)
Patient states she was intolerant to Toprol due to HAs. She did ok with Atenolol. Will d/c Toprol and start Atenolol 25 mg QD.

## 2011-06-23 NOTE — Telephone Encounter (Signed)
Patient seen in Nuclear Med today for stress myoview. Developed rapid AFib at end of test.  Highest HR 218. HR 90 at rest.   Patient completely asymptomatic. Nuclear images pending. She is on Pradaxa and told staff she is still taking. Advised to continue on Pradaxa. Start Toprol XL 25 mg QD. Rx sent in. Will get patient to follow up in office in the next week to follow up on rate control. Tereso Newcomer, PA-C

## 2011-06-23 NOTE — Telephone Encounter (Signed)
Addended byAlben Spittle, Lorin Picket T on: 06/23/2011 01:37 PM   Modules accepted: Orders

## 2011-06-24 ENCOUNTER — Telehealth: Payer: Self-pay | Admitting: *Deleted

## 2011-06-24 NOTE — Telephone Encounter (Signed)
Pt husband returning call to [place]. Please call back.

## 2011-06-24 NOTE — Progress Notes (Signed)
nuc med report routed to Dr. Graciela Husbands & Tereso Newcomer PA 06/24/11 Domenic Polite

## 2011-06-24 NOTE — Telephone Encounter (Signed)
pt aware of myoview results and had multiple questions. I answered what I could and told her the other questions she had were questions for the PA/Dr, she has appt 06/25/11 @ 1:45 same day Dr. Graciela Husbands is in the office. Tereso Newcomer, PA-C even tried to talk to the pt to help her understand the results. Pt has asked for copies of each EKG  That was done during her stress test and the pictures from the nuclear images as well as a copy the myoview report , both Scott and I told her she could have this when she comes in tomorrow to see the PA. [name]

## 2011-06-24 NOTE — Telephone Encounter (Signed)
lmom on work # for [name] for stress test results. [name]

## 2011-06-25 ENCOUNTER — Ambulatory Visit (INDEPENDENT_AMBULATORY_CARE_PROVIDER_SITE_OTHER): Payer: BC Managed Care – PPO | Admitting: Physician Assistant

## 2011-06-25 ENCOUNTER — Encounter: Payer: Self-pay | Admitting: Physician Assistant

## 2011-06-25 VITALS — BP 120/76 | HR 70 | Ht 63.0 in | Wt 103.0 lb

## 2011-06-25 DIAGNOSIS — R079 Chest pain, unspecified: Secondary | ICD-10-CM

## 2011-06-25 DIAGNOSIS — I4891 Unspecified atrial fibrillation: Secondary | ICD-10-CM

## 2011-06-25 NOTE — Patient Instructions (Signed)
Your physician recommends that you schedule a follow-up appointment in: 07/29/11 @ 10:00 am to see Tereso Newcomer, PA-C the same day Dr. Graciela Husbands is in the office.  No changes at this time.

## 2011-06-25 NOTE — Assessment & Plan Note (Signed)
Her stress test was normal.  It was not gated due to atrial fibrillation.  However, she had normal LV function approximately one year ago by echocardiogram.  She had a lot of questions today regarding her stress test.  I spent a great deal of time history and all of these for her today.

## 2011-06-25 NOTE — Progress Notes (Signed)
History of Present Illness: Primary Electrophysiologist:  Dr. Sherryl Manges Ophthalmologist:  Dr. Delrae Sawyers  Connie Chase is a 65 y.o. female presents for evaluation of possible recurrent atrial fibrillation.  She underwent cardioversion in 3/11.  She had remained in sinus rhythm since that time and was last seen in 10/11.  Of note she does have a CHADS2-VASc score of 2 (age and gender).  She has been maintained on anticoagulation with Pradaxa.  She had cataract surgery in the spring.  She had a hemorrhage in one of her eyes.  She stopped taking the Pradaxa for some time after this.  It is unclear whether or not she discussed this with her ophthalmologist.  She tells me that no one ever told her that she should discontinue anti-coagulation permanently.  She may need another cataract procedure in the near future.  Several weeks ago, she started to feel fatigued again with an irregular heartbeat.  This reminded her of her previous atrial fibrillation. She noted some chest discomfort with her palpitations.    I saw her on 8/3 and set her up for an Event monitor as well as a stress Myoview.  Labs obtained demonstrated a potassium of 4.3, creatinine 0.6, TSH 1.22, hemoglobin 14.4.  She had a stress test earlier this week and developed a rapid atrial fibrillation during exercise.  Her heart rate recovered to the 90s but still remained in atrial fibrillation when discharged home.  We placed her back on atenolol 25 mg once a day and set her up for earlier followup.  She is back in normal sinus rhythm today.  She could tell that she was back and rhythm.  When she feels her heart skipping, she feels that she is back out of normal rhythm.  When she is in atrial fibrillation, she feels fatigued and she feels palpitations in her chest.  She denies significant dyspnea or chest pain.  She denies syncope.  Past Medical History  Diagnosis Date  . Atrial fibrillation     s/p DCCV in 3/11  . Other malaise and  fatigue   . Lower extremity edema     Right  . Hepatitis B infection     Current Outpatient Prescriptions  Medication Sig Dispense Refill  . atenolol (TENORMIN) 25 MG tablet Take 1 tablet (25 mg total) by mouth daily.  30 tablet  11  . dabigatran (PRADAXA) 150 MG CAPS Take 150 mg by mouth 2 (two) times daily.        . Magnesium 250 MG TABS Take 1 tablet by mouth daily.        . Multiple Vitamin (MULTIVITAMIN) tablet Take 1 tablet by mouth daily.        . Multiple Vitamins-Minerals (ICAPS) CAPS Take 1 capsule by mouth daily.        . Omega-3 Fatty Acids (FISH OIL) 1200 MG CAPS Take 1 capsule by mouth daily.        . vitamin C (ASCORBIC ACID) 500 MG tablet Take 500 mg by mouth daily.          Allergies: No Known Allergies  Social history:  Nonsmoker  Vital Signs: Ht 5\' 3"  (1.6 m)  Wt 103 lb (46.72 kg)  BMI 18.25 kg/m2  PHYSICAL EXAM: Well nourished, well developed, in no acute distress HEENT: normal Neck: no JVD At 90 degrees Cardiac:  normal S1, S2; RRR; no murmur Lungs:  clear to auscultation bilaterally, no wheezing, rhonchi or rales Abd: soft, nontender Ext: no edema Skin: warm  and dry Neuro:  CNs 2-12 intact, no focal abnormalities noted  EKG:  Normal sinus rhythm, heart rate 70, no ischemic changes  ASSESSMENT AND PLAN:

## 2011-06-25 NOTE — Assessment & Plan Note (Signed)
She is back in normal sinus rhythm.  I have recommended that she continue atenolol 25 mg daily.  I have also recommended that she continue to wear her event monitor until completion.  She will return in followup in the next several weeks.  At that time, If she continues to have significant, symptomatic, episodes of paroxysmal atrial fibrillation, we can consider antiarrhythmic therapy.  She has been advised to continue her Pradaxa.

## 2011-07-09 ENCOUNTER — Other Ambulatory Visit: Payer: Self-pay | Admitting: Internal Medicine

## 2011-07-09 MED ORDER — DABIGATRAN ETEXILATE MESYLATE 150 MG PO CAPS: 150.0000 mg | ORAL_CAPSULE | Freq: Two times a day (BID) | ORAL | Status: DC

## 2011-07-09 NOTE — Telephone Encounter (Signed)
Please call in 90 day supply

## 2011-07-29 ENCOUNTER — Ambulatory Visit: Payer: BC Managed Care – PPO | Admitting: Physician Assistant

## 2011-07-29 ENCOUNTER — Encounter: Payer: Self-pay | Admitting: Physician Assistant

## 2011-07-29 ENCOUNTER — Ambulatory Visit (INDEPENDENT_AMBULATORY_CARE_PROVIDER_SITE_OTHER): Payer: BC Managed Care – PPO | Admitting: Physician Assistant

## 2011-07-29 DIAGNOSIS — I4891 Unspecified atrial fibrillation: Secondary | ICD-10-CM

## 2011-07-29 DIAGNOSIS — R002 Palpitations: Secondary | ICD-10-CM

## 2011-07-29 MED ORDER — DIGOXIN 125 MCG PO TABS: 125.0000 ug | ORAL_TABLET | Freq: Every day | ORAL | Status: DC

## 2011-07-29 NOTE — Assessment & Plan Note (Signed)
I discussed her case with Dr. Graciela Husbands today.  He agreed that Flecainide would be a reasonable approach to help control her atrial fibrillation since she is symptomatic.  I had a long discussion with the patient regarding this today.  She is not interested in pursuing antiarrhythmic therapy at this time.  She is back in Atrial fibrillation today.  She is anticoagulated with Pradaxa.  Her blood pressure is too soft to increase her atenolol.  I will place her on digoxin 0.125 mg daily.  I will have her obtain an echocardiogram to ensure that her LV function is normal.  She will be brought back in one week for a Digoxin level and EKG.  She can followup with Dr. Graciela Husbands in 4-6 weeks.

## 2011-07-29 NOTE — Patient Instructions (Signed)
Your physician recommends that you schedule a follow-up appointment in: 4-6 weeks with Dr. Graciela Husbands as per Tereso Newcomer, PA-C  Your physician has requested that you have an echocardiogram  DX AFIB . Echocardiography is a painless test that uses sound waves to create images of your heart. It provides your doctor with information about the size and shape of your heart and how well your heart's chambers and valves are working. This procedure takes approximately one hour. There are no restrictions for this procedure.   Your physician recommends that you schedule a follow-up appointment in: 1 WEEK FOR A NURSE VISIT FOR AN EKG AND TO HAVE LAB WORK DIGOXIN LEVEL AFIB  Your physician has recommended you make the following change in your medication: START DIGOXIN 0.125 MG 1 TABLET DAILY A PRESCRIPTION HAS BEEN GIVEN TO YOU TODAY

## 2011-07-29 NOTE — Progress Notes (Signed)
History of Present Illness: Primary Electrophysiologist:  Dr. Sherryl Manges Ophthalmologist:  Dr. Delrae Sawyers  Connie Chase is a 65 y.o. female presents for evaluation atrial fibrillation.  She underwent cardioversion in 3/11.  She had remained in sinus rhythm since that time and was last seen in 10/11.  Of note she does have a CHADS2-VASc score of 2 (age and gender).  She has been maintained on anticoagulation with Pradaxa.  She had cataract surgery in the spring.  She had a hemorrhage in one of her eyes.  She stopped taking the Pradaxa for some time after this.  She apparently discussed this with her ophthalmologist.  She tells me that no one ever told her that she should discontinue anti-coagulation permanently.    She had a myoview 8/13.  It was not gated as she was back in AFib.  She had no ischemia.  She had an event monitor.  It was reviewed today and she had a couple episodes of AF with RVR.  She presents for follow up.  She is symptomatic with AF.  For unclear reasons, when she feels she is in AF, she walks on the treadmill faster to get her HR up.  She denies chest pain.  No dyspnea.  No syncope.  She feels lightheaded sometimes.  She notes palpitations when she is in AF. She feels that increased stress brings it on.  She felt that she was back in AF today.  Past Medical History  Diagnosis Date  . Atrial fibrillation     s/p DCCV in 3/11  . Other malaise and fatigue   . Lower extremity edema     Right  . Hepatitis B infection     Current Outpatient Prescriptions  Medication Sig Dispense Refill  . atenolol (TENORMIN) 25 MG tablet Take 1 tablet (25 mg total) by mouth daily.  30 tablet  11  . dabigatran (PRADAXA) 150 MG CAPS Take 1 capsule (150 mg total) by mouth 2 (two) times daily.  180 capsule  2  . Magnesium 250 MG TABS Take 1 tablet by mouth daily.        . Multiple Vitamin (MULTIVITAMIN) tablet Take 1 tablet by mouth daily.        . Multiple Vitamins-Minerals (ICAPS) CAPS  Take 1 capsule by mouth daily.        . Omega-3 Fatty Acids (FISH OIL) 1200 MG CAPS Take 1 capsule by mouth daily.        . vitamin C (ASCORBIC ACID) 500 MG tablet Take 500 mg by mouth daily.        . digoxin (LANOXIN) 0.125 MG tablet Take 1 tablet (125 mcg total) by mouth daily.  30 tablet  11  . DISCONTD: digoxin (LANOXIN) 0.125 MG tablet Take 1 tablet (125 mcg total) by mouth daily.  30 tablet  11    Allergies: No Known Allergies  Social history:  Nonsmoker  Vital Signs: BP 94/60  Pulse 101  Ht 5\' 1"  (1.549 m)  Wt 103 lb 6.4 oz (46.902 kg)  BMI 19.54 kg/m2  PHYSICAL EXAM: Well nourished, well developed, in no acute distress HEENT: normal Neck: no JVD At 90 degrees Cardiac:  normal S1, S2; irreg irreg; no murmur Lungs:  clear to auscultation bilaterally, no wheezing, rhonchi or rales Abd: soft, nontender Ext: no edema Skin: warm and dry Neuro:  CNs 2-12 intact, no focal abnormalities noted  EKG:  Atrial fibrillation, heart rate 101, and normal axis, nonspecific ST-T wave changes  ASSESSMENT  AND PLAN:

## 2011-07-31 ENCOUNTER — Telehealth: Payer: Self-pay | Admitting: Internal Medicine

## 2011-07-31 NOTE — Telephone Encounter (Signed)
HAVING DIZZINESS FROM MED, DIGOXEN, BP IS VERY LOW

## 2011-07-31 NOTE — Telephone Encounter (Signed)
Pt. Was called to see how she was doing. Patient states that she is fine for now. She will call the office tomorrow if needed.

## 2011-07-31 NOTE — Telephone Encounter (Signed)
Patient  Called because she is having dizziness today more than other times. Some time she feels like she is about to faint. B/P yesterday was 98/ systolic, this morning B.P is 40/98 pulse 64 beats per minute. Patient has not take her Atenolol or digoxin this AM, She takes this two medications in the evenings. Scott weaver [place] recommends for pt to hold the atenolol today but take the Digoxin, also patient to drink plenty of fluids and add salt to her food. Patient to go to the ER if symptoms get worse. Patient aware she verbalized understanding.

## 2011-08-05 ENCOUNTER — Ambulatory Visit (INDEPENDENT_AMBULATORY_CARE_PROVIDER_SITE_OTHER): Payer: BC Managed Care – PPO

## 2011-08-05 ENCOUNTER — Ambulatory Visit (HOSPITAL_COMMUNITY): Payer: BC Managed Care – PPO | Attending: Cardiology

## 2011-08-05 ENCOUNTER — Other Ambulatory Visit: Payer: BC Managed Care – PPO | Admitting: *Deleted

## 2011-08-05 VITALS — BP 112/74 | HR 88 | Resp 18 | Ht 63.0 in | Wt 104.0 lb

## 2011-08-05 DIAGNOSIS — R002 Palpitations: Secondary | ICD-10-CM

## 2011-08-05 DIAGNOSIS — R5381 Other malaise: Secondary | ICD-10-CM | POA: Insufficient documentation

## 2011-08-05 DIAGNOSIS — M7989 Other specified soft tissue disorders: Secondary | ICD-10-CM | POA: Insufficient documentation

## 2011-08-05 DIAGNOSIS — I059 Rheumatic mitral valve disease, unspecified: Secondary | ICD-10-CM | POA: Insufficient documentation

## 2011-08-05 DIAGNOSIS — I4891 Unspecified atrial fibrillation: Secondary | ICD-10-CM | POA: Insufficient documentation

## 2011-08-05 DIAGNOSIS — I079 Rheumatic tricuspid valve disease, unspecified: Secondary | ICD-10-CM | POA: Insufficient documentation

## 2011-08-05 NOTE — Progress Notes (Signed)
Patient in for  EKG and Digoxin blood level. B/P 112/74 left arm. EKG A-fib rate 88 beats/minute, read by Tereso Newcomer PA. Patient is taken Digoxin 0.125 mg daily. Atenolol 25 mg was d/C 07/31/11 due to dizziness and low B/P. Patient said Atenolol  medication was making her feel bad . Pt is doing  fine today ,no C/O offered.

## 2011-08-06 ENCOUNTER — Other Ambulatory Visit (HOSPITAL_COMMUNITY): Payer: BC Managed Care – PPO | Admitting: Radiology

## 2011-08-06 LAB — DIGOXIN LEVEL: Digoxin Level: 0.3 ng/mL — ABNORMAL LOW (ref 0.8–2.0)

## 2011-09-02 ENCOUNTER — Ambulatory Visit (INDEPENDENT_AMBULATORY_CARE_PROVIDER_SITE_OTHER): Payer: BC Managed Care – PPO | Admitting: Internal Medicine

## 2011-09-02 ENCOUNTER — Encounter: Payer: Self-pay | Admitting: Internal Medicine

## 2011-09-02 DIAGNOSIS — R002 Palpitations: Secondary | ICD-10-CM

## 2011-09-02 DIAGNOSIS — I4891 Unspecified atrial fibrillation: Secondary | ICD-10-CM

## 2011-09-02 MED ORDER — PROPRANOLOL HCL 10 MG PO TABS: 10.0000 mg | ORAL_TABLET | Freq: Four times a day (QID) | ORAL | Status: DC | PRN

## 2011-09-02 NOTE — Patient Instructions (Signed)
Your physician has recommended you make the following change in your medication: STOP Digoxin  Start Inderal 10 mg, 1 tablet every 6 hours AS NEEDED.  This prescription has been sent to your pharmacy  Follow up with Dr. Graciela Husbands in 4 months  Thank you

## 2011-09-02 NOTE — Assessment & Plan Note (Addendum)
Patient has recurrent atrial fibrillation with a rapid ventricular response. She has had some tendency toward bradycardia. She thinks the digoxin is making her dizzy. We will stop it. We will use Inderal p.r.n. For right now. She is comfortable taking Pradaxa.  We discussed proarrhythmic potentials of rhythm controlling drugs. This would be our next step as bradycardia may preclude a rate controlling strategy.  For right now, we will continue on our current course  We discussed this for about 25 minutes

## 2011-09-02 NOTE — Progress Notes (Signed)
   Connie Chase is a 65 y.o. female Seen in followup for atrial fibrillation.  She underwent cardioversion in 3/11.  She had remained in sinus rhythm since that time and was last seen in 10/11.  Of note she does have a CHADS2-VASc score of 2 (age and gender).  She has been maintained on anticoagulation with Pradaxa.  She had cataract surgery in the spring.  She had a hemorrhage in one of her eyes.  She stopped taking the Pradaxa for some time after this.  She apparently discussed this with her ophthalmologist.  She tells me that no one ever told her that she should discontinue anti-coagulation permanently.    She had a myoview 8/13.  It was not gated as she was back in AFib.  She had no ischemia  She is symptomatic with AF.  Echo cardiogram September 2012 demonstrated normal left ventricular function and mild left atrial enlargement  She underwent event recording demonstrating rapid atrial fibrillation alternating with sinus rhythm.  He feels his episodes of atrial fibrillation are occurring almost daily lasting variable periods of time. The severe palpitations are quite brief.     HPI   Past Medical History  Diagnosis Date  . Atrial fibrillation     s/p DCCV in 3/11  . Other malaise and fatigue   . Lower extremity edema     Right  . Hepatitis B infection     No past surgical history on file.  Current Outpatient Prescriptions  Medication Sig Dispense Refill  . dabigatran (PRADAXA) 150 MG CAPS Take 1 capsule (150 mg total) by mouth 2 (two) times daily.  180 capsule  2  . digoxin (LANOXIN) 0.125 MG tablet Take 1 tablet (125 mcg total) by mouth daily.  30 tablet  11  . Magnesium 250 MG TABS Take 1 tablet by mouth daily.        . Multiple Vitamin (MULTIVITAMIN) tablet Take 1 tablet by mouth daily.        . Multiple Vitamins-Minerals (ICAPS) CAPS Take 1 capsule by mouth daily.        . Omega-3 Fatty Acids (FISH OIL) 1200 MG CAPS Take 1 capsule by mouth daily.        . vitamin C  (ASCORBIC ACID) 500 MG tablet Take 500 mg by mouth daily.          No Known Allergies  Review of Systems negative except from HPI and PMH  Physical Exam Well developed and well nourished in no acute distress HENT normal E scleral and icterus clear Neck Supple JVP flat; carotids brisk and full Clear to ausculation Regular rate and rhythm, no murmurs gallops or rub Soft with active bowel sounds No clubbing cyanosis and edema Alert and oriented, grossly normal motor and sensory function Skin Warm and Dry  ECG sinus rhythm with occasional PACs. There is a bradycardic interval as well as a normal rate intervals Assessment and  Plan

## 2011-09-25 ENCOUNTER — Ambulatory Visit: Payer: BC Managed Care – PPO | Admitting: Gastroenterology

## 2011-09-25 ENCOUNTER — Ambulatory Visit (INDEPENDENT_AMBULATORY_CARE_PROVIDER_SITE_OTHER): Payer: BC Managed Care – PPO | Admitting: Gastroenterology

## 2011-09-25 DIAGNOSIS — B181 Chronic viral hepatitis B without delta-agent: Secondary | ICD-10-CM

## 2011-09-25 LAB — PHOSPHORUS: Phosphorus: 3.5 mg/dL (ref 2.3–4.6)

## 2011-09-25 LAB — HEPATIC FUNCTION PANEL
ALT: 37 U/L — ABNORMAL HIGH (ref 0–35)
Alkaline Phosphatase: 65 U/L (ref 39–117)
Indirect Bilirubin: 0.3 mg/dL (ref 0.0–0.9)
Total Protein: 7.2 g/dL (ref 6.0–8.3)

## 2011-09-29 LAB — HEPATITIS B E ANTIGEN: Hepatitis Be Antigen: NEGATIVE

## 2011-10-01 ENCOUNTER — Encounter: Payer: Self-pay | Admitting: Gastroenterology

## 2011-10-01 LAB — HEPATITIS B DNA, ULTRAQUANTITATIVE, PCR

## 2011-10-01 NOTE — Progress Notes (Signed)
NAME:  Connie Chase, Connie Chase    MR#:  [id number]      DATE:  09/25/2011  DOB:  [birth year]    cc: Consulting Physician:  Meredeth Ide, MD, [facility], [address], [place], [place] [address], [place] [phone]  Primary Care Physician:  Shaune Pollack, MD, [facility] [place], [address], [place], [place] [address], Phone [phone]  Referring Physician:  Dorena Cookey, MD, [facility], [address], [place], [place] [address], Fax [phone]  Sherryl Manges, MD, [facility] [address], [address], [place], [place] [address], Fax [phone]    Reason for visit:  Followup of genotype B, E antibody positive HBV.    History:  The patient returns today unaccompanied. Since last being seen on 05/08/2011, there been no symptoms referable to her history of  hepatitis B. There are no symptoms to suggest vasculitis or decompensated liver disease.   PAST MEDICAL HISTORY:  Significant for atrial fibrillation for which she is followed by Dr. Graciela Husbands in cardiology.   CURRENT MEDICATIONS:  Tenofovir 300 mg p.o. daily, Pradaxa 150 mg b.i.d., multivitamin daily, I caps once daily, fish oil 1200 mg b.i.d., vitamin C 500 mg  b.i.d., Citracal 315 mg daily, cinnamon 0.25 teaspoons with filtered water b.i.d.    Allergies:  Denies.    Habits:  Smoking, never. Alcohol, denies interval consumption.   REVIEW OF SYSTEMS:  All 10 systems reviewed today with the patient and they are negative other than which is mentioned above. The CES-D was 33.   PHYSICAL EXAMINATION:   Constitutional:  Stated age without stigmata of chronic liver disease. Vital signs: Height 63.5 inches, weight 107 pounds up 4 pounds from previously. Blood pressure 115/67, pulse 66, temperature 97.5 Fahrenheit. Ears, nose, mouth and throat:  Unremarkable oropharynx.  No thyromegaly or neck masses.  Chest:  Resonant  to percussion.  Clear to auscultation.  Cardiovascular:  Heart sounds normal S1, S2 without murmurs or rubs.  There is no peripheral edema.  Abdominal:  Normal bowel sounds.  No masses or tenderness.  I could not appreciate a liver edge or spleen tip.  I could not appreciate any hernias.  Lymphatics:  No cervical or inguinal lymphadenopathy.  Central Nervous System:  No asterixis or focal neurologic findings.  Dermatologic:  Anicteric without palmar erythema or spider angiomata.  Eyes:  Anicteric sclerae.  Pupils are equal and reactive to light.   Laboratories:  From 05/08/2011; ALT was 43 and her HBV DNA was detectable but not quantifiable i.e., less than 20 units per I per mL.  Most recent imaging was an ultrasound liver on 04/03/2011, which was unremarkable.    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 Her pretreatment  viral load on 03/12/2000, was 1,024,570 international units per mL. Approximately 2 months into therapy, on 06/11/2010 her viral load was 250 international units per mL.  On 09/19/2010, at week 27, her  viral load was detectable but less than 20 international units per mL. At week 41 on 12/26/2010, it was undetectable and it remains detectable but not quantifiable. This is an acceptable response to   tenofovir.  In terms of her hepatitis B care, she is A immune. She had been screened for hepatocellular carcinoma with ultrasound and is due again for another one in December 2012.  In my discussion today with the patient, we discussed her previous lab work. Consistent with her previous behavior, she was extremely anxious  about nonsignificant changes in her liver enzymes, which I tried my best to reassure her about.   Plan:  1. Standard labs today. 2. I renewed her prescription for tenofovir and 300 mg daily 90 day supply with 4  refills. 3. Hepatitis A immune. 4. Book for ultrasound December 2012. 5. Return in late February to early 2013 for 3 month follow up. 6. She wished to have a copy of the lab work mailed to her for review.            Brooke Dare, MD   ADDENDUM  HBV DNA undetectable  ALT 37  [phone]  D:  Thu Nov 15 17:33:03 2012 ; T:  Thu Nov 15 18:41:27 2012  Job #:  [id number]

## 2011-10-07 ENCOUNTER — Other Ambulatory Visit (HOSPITAL_COMMUNITY): Payer: BC Managed Care – PPO

## 2011-10-07 ENCOUNTER — Ambulatory Visit (HOSPITAL_COMMUNITY)
Admission: RE | Admit: 2011-10-07 | Discharge: 2011-10-07 | Disposition: A | Discharge status: Home / Self Care | Payer: BC Managed Care – PPO | Source: Ambulatory Visit | Attending: Gastroenterology | Admitting: Gastroenterology

## 2011-10-07 DIAGNOSIS — B181 Chronic viral hepatitis B without delta-agent: Secondary | ICD-10-CM | POA: Insufficient documentation

## 2011-11-05 ENCOUNTER — Encounter: Payer: Self-pay | Admitting: Internal Medicine

## 2011-11-11 HISTORY — PX: CATARACT EXTRACTION W/ INTRAOCULAR LENS IMPLANT: SHX1309

## 2012-01-08 ENCOUNTER — Ambulatory Visit (INDEPENDENT_AMBULATORY_CARE_PROVIDER_SITE_OTHER): Payer: BC Managed Care – PPO | Admitting: Gastroenterology

## 2012-01-08 DIAGNOSIS — B181 Chronic viral hepatitis B without delta-agent: Secondary | ICD-10-CM

## 2012-01-08 LAB — COMPLETE METABOLIC PANEL WITH GFR
ALT: 32 U/L (ref 0–35)
Alkaline Phosphatase: 60 U/L (ref 39–117)
Creat: 0.7 mg/dL (ref 0.50–1.10)
GFR, Est Non African American: >89 mL/min
Sodium: 144 mEq/L (ref 135–145)
Total Bilirubin: 0.4 mg/dL (ref 0.3–1.2)
Total Protein: 6.9 g/dL (ref 6.0–8.3)

## 2012-01-08 NOTE — Progress Notes (Signed)
NAME: Connie Chase, Connie Chase  MR#: [id number]      DATE: 01/08/2012 DOB: 02/12/1946    cc:  Consulting Physician: Meredeth Ide, MD, [facility], [address], [address], [place], [place] [address], [place] [phone]   Primary Care Physician: Shaune Pollack, MD, [facility] [place], [address], [place], [place] [address], Phone [phone]   Referring Physician: Dorena Cookey, MD, [facility], [address], [place], [place] [address], Fax [phone]   Sherryl Manges, MD, [facility] [address], [address], [place], [place] [address], Fax [phone]    REASON FOR VISIT:  Followup of genotype B, E antibody positive HBV.   HISTORY:  The patient returns today unaccompanied. Since last being seen on 09/25/11, there been no symptoms referable to her history of  hepatitis B. There are no symptoms to suggest vasculitis or decompensated liver disease.   PAST MEDICAL HISTORY:  Significant for atrial fibrillation for which she is followed by Dr. Graciela Husbands in cardiology.   CURRENT MEDICATIONS:  Tenofovir 300 mg p.o. daily, Pradaxa 150 mg b.i.d., multivitamin daily, I caps once daily, fish oil 1200 mg b.i.d., vitamin C 500 mg b.i.d., Citracal 315 mg daily, cinnamon 0.25 teaspoons with filtered water b.i.d.   ALLERGIES:  Denies.   HABITS:  Smoking, never. Alcohol, denies interval consumption.   REVIEW OF SYSTEMS:  All 10 systems reviewed today with the patient and they are negative other than which is mentioned above. The CES-D was 28.   PHYSICAL EXAMINATION:  Constitutional: Stated age without stigmata of chronic liver disease. Vital signs: Height 63.5 inches, weight 108 pounds up 1 pound from previously. Blood pressure 133/87, pulse 88, temperature 98.0 Fahrenheit.  Ears, nose, mouth and throat: Unremarkable oropharynx. No thyromegaly or neck masses.  Chest: Resonant to percussion. Clear  to auscultation. Cardiovascular: Heart sounds normal S1, S2 without murmurs or rubs. There is no peripheral edema.  Abdominal: Normal bowel sounds. No masses or tenderness. I could not appreciate a liver edge or spleen tip. I could not appreciate any hernias.  Lymphatics: No cervical or inguinal lymphadenopathy.  Central Nervous System: No asterixis or focal neurologic findings.  Dermatologic: Anicteric without palmar erythema or spider angiomata.  Eyes: Anicteric sclerae. Pupils are equal and reactive to light.   LABORATORIES:  Reviewed from 09/25/11.  Most recent imaging was an ultrasound liver on 09/25/2011, which was unremarkable.   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 Her pretreatment  viral load on 03/12/2000, was 1,024,570 international units per mL. Approximately 2 months into therapy, on 06/11/2010 her viral load was 250 international units per mL. On 09/19/2010, at week 27, her  viral load was detectable but less than 20 international units per mL. At week 41 on 12/26/2010, it was undetectable.  By 09/25/11, week 77, it was undetectable.  This is an acceptable response to  tenofovir.  In terms of her hepatitis B care, she is A immune. She had been screened for hepatocellular carcinoma with ultrasound and is due again for another one in April 2013.  In my discussion today with the patient, we discussed her previous lab work. Consistent with her previous behavior, she was extremely anxious about nonsignificant changes in her liver enzymes and her AFP, which I tried my best to reassure her about.  She would like to push back  the next ultrasound to coincide with her next appointment.   PLAN:  1. Standard labs today. 2. Hepatitis A immune. 3. Book for ultrasound April 27, 2012. 4. Return on April 29, 2012. 5. She wished to have a copy  of the lab work mailed to her for review.  Brooke Dare, MD

## 2012-01-09 LAB — HEPATITIS B SURFACE ANTIBODY,QUALITATIVE: Hep B S Ab: NEGATIVE

## 2012-01-12 LAB — HEPATITIS B E ANTIGEN: Hepatitis Be Antigen: NEGATIVE

## 2012-01-12 LAB — HEPATITIS B E ANTIBODY: Hepatitis Be Antibody: POSITIVE — AB

## 2012-01-13 LAB — HEPATITIS B DNA, ULTRAQUANTITATIVE, PCR: Hepatitis B DNA (Calc): <116 copies/mL (ref <116)

## 2012-01-15 ENCOUNTER — Encounter: Payer: Self-pay | Admitting: Gastroenterology

## 2012-02-05 ENCOUNTER — Ambulatory Visit: Payer: BC Managed Care – PPO | Admitting: Internal Medicine

## 2012-02-09 ENCOUNTER — Telehealth: Payer: Self-pay | Admitting: Internal Medicine

## 2012-02-09 NOTE — Telephone Encounter (Signed)
LOCATED FORM ON DR Odessa Fleming CART  WILL HAVE  DR Graciela Husbands REVIEW THIS AFTERNOON .BECKY AWARE .Zack Seal

## 2012-02-09 NOTE — Telephone Encounter (Signed)
New problem:  Per Becky, status of cardiac clearance. Surgery on 4/2.

## 2012-03-10 ENCOUNTER — Encounter: Payer: Self-pay | Admitting: *Deleted

## 2012-03-10 ENCOUNTER — Ambulatory Visit (INDEPENDENT_AMBULATORY_CARE_PROVIDER_SITE_OTHER): Payer: BC Managed Care – PPO | Admitting: Internal Medicine

## 2012-03-10 ENCOUNTER — Encounter: Payer: Self-pay | Admitting: Internal Medicine

## 2012-03-10 VITALS — BP 114/76 | HR 95 | Ht 63.5 in | Wt 106.0 lb

## 2012-03-10 DIAGNOSIS — I4891 Unspecified atrial fibrillation: Secondary | ICD-10-CM

## 2012-03-10 MED ORDER — PROPRANOLOL HCL 10 MG PO TABS: 10.0000 mg | ORAL_TABLET | Freq: Three times a day (TID) | ORAL | Status: DC

## 2012-03-10 MED ORDER — FLECAINIDE ACETATE 100 MG PO TABS: 100.0000 mg | ORAL_TABLET | Freq: Two times a day (BID) | ORAL | Status: DC

## 2012-03-10 NOTE — Assessment & Plan Note (Signed)
We discussed extensively treatment options for her atrial fibrillation. In addition to resuming her propranolol that she was prescribed last time, we discussed antiarrhythmic drug therapy with the potential for proarrhythmia and its effectiveness. As she is in atrial fibrillation and has been for some time, we will anticipate initiation of pre-cardioversion flecainide and she would then need post cardioversion stress testing.

## 2012-03-10 NOTE — Patient Instructions (Addendum)
Your physician has recommended that you have a Cardioversion (DCCV). Electrical Cardioversion uses a jolt of electricity to your heart either through paddles or wired patches attached to your chest. This is a controlled, usually prescheduled, procedure. Defibrillation is done under light anesthesia in the hospital, and you usually go home the day of the procedure. This is done to get your heart back into a normal rhythm. You are not awake for the procedure. Please see the instruction sheet given to you today.  Your physician has recommended you make the following change in your medication:  1) resume propranolol 2) Start Flecainide 100 mg one tablet by mouth twice daily- start this on May Tuesday 03/30/12.  Your physician has requested that you have an exercise tolerance test done around Monday 04/05/12. For further information please visit [email or web address] Please also follow instruction sheet, as given.

## 2012-03-10 NOTE — Progress Notes (Signed)
  HPI  Connie Chase is a 66 y.o. female Seen in followup for paroxysmal atrial fibrillation. She has a history of prior cardioversion   3/11.  She continues to have paroxysms. The atrial fibrillation is quite symptomatic.    Of note she does have a CHADS2-VASc score of 2 (age and gender).  She has been maintained on anticoagulation with Pradaxa.  She had a myoview 8/12.  It was not gated as she was back in AFib.   Echo cardiogram September 2012 demonstrated normal left ventricular function and mild left atrial enlargement  She has continued to have episodes of atrial fibrillation which remain problematic although it is hard for, because of language, to get a sense of exactly how it is problematic.   Past Medical History  Diagnosis Date  . Atrial fibrillation     s/p DCCV in 3/11  . Other malaise and fatigue   . Lower extremity edema     Right  . Hepatitis B infection     No past surgical history on file.  Current Outpatient Prescriptions  Medication Sig Dispense Refill  . dabigatran (PRADAXA) 150 MG CAPS Take 1 capsule (150 mg total) by mouth 2 (two) times daily.  180 capsule  2  . Magnesium 250 MG TABS Take 1 tablet by mouth daily.        . Multiple Vitamin (MULTIVITAMIN) tablet Take 1 tablet by mouth daily.        . Multiple Vitamins-Minerals (ICAPS) CAPS Take 1 capsule by mouth daily.        . NON FORMULARY 400 mg daily.      . Omega-3 Fatty Acids (FISH OIL) 1200 MG CAPS Take 1 capsule by mouth daily.        . vitamin C (ASCORBIC ACID) 500 MG tablet Take 500 mg by mouth daily.          No Known Allergies  Review of Systems negative except from HPI and PMH  Physical Exam BP 114/76  Pulse 95  Ht 5' 3.5" (1.613 m)  Wt 106 lb (48.081 kg)  BMI 18.48 kg/m2 Well developed and well nourished in no acute distress HENT normal E scleral and icterus clear Neck Supple JVP flat; carotids brisk and full Clear to ausculation irregularly irregular   no murmurs gallops or  rub Soft with active bowel sounds No clubbing cyanosis none Edema Alert and oriented, grossly normal motor and sensory function Skin Warm and Dry  The echocardiogram demonstrates atrial fibrillation at 95 Intervals-/08/35 with a QTC of 44 Assessment and  Plan

## 2012-03-19 ENCOUNTER — Encounter (HOSPITAL_COMMUNITY): Payer: Self-pay | Admitting: Pharmacy Technician

## 2012-03-30 ENCOUNTER — Other Ambulatory Visit: Payer: Self-pay | Admitting: Internal Medicine

## 2012-03-30 NOTE — Telephone Encounter (Signed)
Refill- Pradaxa  Patient requesting 90 day supply.   Verified  [facility] [address]  Patient can be reached at [phone] for additional information.

## 2012-03-31 MED ORDER — DABIGATRAN ETEXILATE MESYLATE 150 MG PO CAPS: 150.0000 mg | ORAL_CAPSULE | Freq: Two times a day (BID) | ORAL | Status: DC

## 2012-04-02 ENCOUNTER — Ambulatory Visit (HOSPITAL_COMMUNITY): Payer: BC Managed Care – PPO | Admitting: *Deleted

## 2012-04-02 ENCOUNTER — Ambulatory Visit (HOSPITAL_COMMUNITY)
Admission: RE | Admit: 2012-04-02 | Discharge: 2012-04-02 | Disposition: A | Discharge status: Home / Self Care | Payer: BC Managed Care – PPO | Source: Ambulatory Visit | Attending: Internal Medicine | Admitting: Internal Medicine

## 2012-04-02 ENCOUNTER — Encounter (HOSPITAL_COMMUNITY)
Admission: RE | Disposition: A | Discharge status: Home / Self Care | Payer: Self-pay | Source: Ambulatory Visit | Attending: Internal Medicine

## 2012-04-02 ENCOUNTER — Encounter (HOSPITAL_COMMUNITY): Payer: Self-pay | Admitting: *Deleted

## 2012-04-02 DIAGNOSIS — Z8619 Personal history of other infectious and parasitic diseases: Secondary | ICD-10-CM | POA: Insufficient documentation

## 2012-04-02 DIAGNOSIS — M1990 Unspecified osteoarthritis, unspecified site: Secondary | ICD-10-CM | POA: Insufficient documentation

## 2012-04-02 DIAGNOSIS — M129 Arthropathy, unspecified: Secondary | ICD-10-CM | POA: Insufficient documentation

## 2012-04-02 DIAGNOSIS — I4891 Unspecified atrial fibrillation: Secondary | ICD-10-CM

## 2012-04-02 DIAGNOSIS — I4892 Unspecified atrial flutter: Secondary | ICD-10-CM | POA: Insufficient documentation

## 2012-04-02 HISTORY — PX: CARDIOVERSION: SHX1299

## 2012-04-02 LAB — BASIC METABOLIC PANEL
BUN: 19 mg/dL (ref 6–23)
Chloride: 106 mEq/L (ref 96–112)
Creatinine, Ser: 0.62 mg/dL (ref 0.50–1.10)
GFR calc Af Amer: >90 mL/min (ref >90)
Glucose, Bld: 118 mg/dL — ABNORMAL HIGH (ref 70–99)
Potassium: 4.5 mEq/L (ref 3.5–5.1)

## 2012-04-02 LAB — PROTIME-INR
INR: 1.23 (ref 0.00–1.49)
Prothrombin Time: 15.8 seconds — ABNORMAL HIGH (ref 11.6–15.2)

## 2012-04-02 LAB — DIFFERENTIAL
Basophils Relative: 1 % (ref 0–1)
Lymphs Abs: 0.8 10*3/uL (ref 0.7–4.0)
Monocytes Absolute: 0.4 10*3/uL (ref 0.1–1.0)
Monocytes Relative: 9 % (ref 3–12)
Neutro Abs: 2.6 10*3/uL (ref 1.7–7.7)
Neutrophils Relative %: 68 % (ref 43–77)

## 2012-04-02 LAB — CBC
HCT: 44.2 % (ref 36.0–46.0)
Hemoglobin: 14.9 g/dL (ref 12.0–15.0)
MCH: 32.6 pg (ref 26.0–34.0)
MCHC: 33.7 g/dL (ref 30.0–36.0)
RBC: 4.57 MIL/uL (ref 3.87–5.11)

## 2012-04-02 SURGERY — CARDIOVERSION
Anesthesia: General | Wound class: Clean

## 2012-04-02 MED ORDER — LIDOCAINE HCL (CARDIAC) 20 MG/ML IV SOLN
INTRAVENOUS | Status: DC | PRN
  Administered 2012-04-02: 10 mg via INTRAVENOUS

## 2012-04-02 MED ORDER — PROPOFOL 10 MG/ML IV EMUL
INTRAVENOUS | Status: DC | PRN
  Administered 2012-04-02: 50 mg via INTRAVENOUS

## 2012-04-02 MED ORDER — SODIUM CHLORIDE 0.9 % IV SOLN: INTRAVENOUS | Status: DC

## 2012-04-02 MED ORDER — SODIUM CHLORIDE 0.9 % IV SOLN
INTRAVENOUS | Status: DC | PRN
  Administered 2012-04-02: 09:00:00 via INTRAVENOUS

## 2012-04-02 NOTE — Discharge Instructions (Signed)
PATIENT INSTRUCTIONS  POST-ANESTHESIA    IMMEDIATELY FOLLOWING SURGERY:  Do not drive or operate machinery for the first twenty four hours after surgery.  Do not make any important decisions for twenty four hours after surgery or while taking narcotic pain medications or sedatives.  If you develop intractable nausea and vomiting or a severe headache please notify your doctor immediately.    FOLLOW-UP:  Please make an appointment with your surgeon as instructed. You do not need to follow up with anesthesia unless specifically instructed to do so.    WOUND CARE INSTRUCTIONS (if applicable):  Keep a dry clean dressing on the anesthesia/puncture wound site if there is drainage.  Once the wound has quit draining you may leave it open to air.  Generally you should leave the bandage intact for twenty four hours unless there is drainage.  If the epidural site drains for more than 36-48 hours please call the anesthesia department.    QUESTIONS?:  Please feel free to call your physician or the hospital operator if you have any questions, and they will be happy to assist you.       [facility] Anesthesia Department  [address]  [place]  [phone]

## 2012-04-02 NOTE — CV Procedure (Signed)
Preop Dx aflutter Post op DX  NSR  Procedure  DC Cardioversion   Pt was sedated by anesthesia receiving 50 mg Propafol  A synchronized shock 100 joules restored sinus Rhythm  Pt tolerated without difficulty

## 2012-04-02 NOTE — Preoperative (Signed)
Beta Blockers   Reason not to administer Beta Blockers:Not Applicable, took this AM 

## 2012-04-02 NOTE — Anesthesia Preprocedure Evaluation (Addendum)
Anesthesia Evaluation  Patient identified by MRN, date of birth, ID band Patient awake    Reviewed: Allergy & Precautions, H&P , NPO status , Patient's Chart, lab work & pertinent test results, reviewed documented beta blocker date and time   Airway Mallampati: II TM Distance: >3 FB Neck ROM: Full    Dental  (+) Teeth Intact and Dental Advisory Given   Pulmonary  breath sounds clear to auscultation        Cardiovascular + dysrhythmias Atrial Fibrillation Rhythm:Regular Rate:Normal     Neuro/Psych negative neurological ROS     GI/Hepatic negative GI ROS, (+) Hepatitis -, B  Endo/Other  negative endocrine ROS  Renal/GU negative Renal ROS     Musculoskeletal negative musculoskeletal ROS (+) Arthritis -, Osteoarthritis,    Abdominal Normal abdominal exam  (+)   Peds  Hematology negative hematology ROS (+)   Anesthesia Other Findings   Reproductive/Obstetrics                          Anesthesia Physical Anesthesia Plan  ASA: II  Anesthesia Plan: General   Post-op Pain Management:    Induction: Intravenous  Airway Management Planned: Mask  Additional Equipment:   Intra-op Plan:   Post-operative Plan:   Informed Consent: I have reviewed the patients History and Physical, chart, labs and discussed the procedure including the risks, benefits and alternatives for the proposed anesthesia with the patient or authorized representative who has indicated his/her understanding and acceptance.   Dental advisory given  Plan Discussed with: Anesthesiologist and Surgeon  Anesthesia Plan Comments:         Anesthesia Quick Evaluation

## 2012-04-02 NOTE — Anesthesia Postprocedure Evaluation (Signed)
  Anesthesia Post-op Note  Patient: Connie Chase  Procedure(s) Performed: Procedure(s) (LRB): CARDIOVERSION (N/A)  Patient Location: Short Stay  Anesthesia Type: General  Level of Consciousness: awake, oriented and patient cooperative  Airway and Oxygen Therapy: Patient Spontanous Breathing and Patient connected to nasal cannula oxygen  Post-op Pain: none  Post-op Assessment: Post-op Vital signs reviewed and Patient's Cardiovascular Status Stable  Post-op Vital Signs: Reviewed and stable  Complications: No apparent anesthesia complications

## 2012-04-02 NOTE — Interval H&P Note (Signed)
History and Physical Interval Note:  04/02/2012 9:44 AM  Connie Chase  has presented today for surgery, with the diagnosis of A-FIB  The various methods of treatment have been discussed with the patient and family. After consideration of risks, benefits and other options for treatment, the patient has consented to  Procedure(s) (LRB): CARDIOVERSION (N/A) as a surgical intervention .  The patients' history has been reviewed, patient examined, no change in status, stable for surgery.  I have reviewed the patients' chart and labs.  Questions were answered to the patient's satisfaction.     Sherryl Manges  ECG atypical flutter  For DCCV

## 2012-04-02 NOTE — H&P (View-Only) (Signed)
  HPI  Connie Chase is a 66 y.o. female Seen in followup for paroxysmal atrial fibrillation. She has a history of prior cardioversion   3/11.  She continues to have paroxysms. The atrial fibrillation is quite symptomatic.    Of note she does have a CHADS2-VASc score of 2 (age and gender).  She has been maintained on anticoagulation with Pradaxa.  She had a myoview 8/12.  It was not gated as she was back in AFib.   Echo cardiogram September 2012 demonstrated normal left ventricular function and mild left atrial enlargement  She has continued to have episodes of atrial fibrillation which remain problematic although it is hard for, because of language, to get a sense of exactly how it is problematic.   Past Medical History  Diagnosis Date  . Atrial fibrillation     s/p DCCV in 3/11  . Other malaise and fatigue   . Lower extremity edema     Right  . Hepatitis B infection     No past surgical history on file.  Current Outpatient Prescriptions  Medication Sig Dispense Refill  . dabigatran (PRADAXA) 150 MG CAPS Take 1 capsule (150 mg total) by mouth 2 (two) times daily.  180 capsule  2  . Magnesium 250 MG TABS Take 1 tablet by mouth daily.        . Multiple Vitamin (MULTIVITAMIN) tablet Take 1 tablet by mouth daily.        . Multiple Vitamins-Minerals (ICAPS) CAPS Take 1 capsule by mouth daily.        . NON FORMULARY 400 mg daily.      . Omega-3 Fatty Acids (FISH OIL) 1200 MG CAPS Take 1 capsule by mouth daily.        . vitamin C (ASCORBIC ACID) 500 MG tablet Take 500 mg by mouth daily.          No Known Allergies  Review of Systems negative except from HPI and PMH  Physical Exam BP 114/76  Pulse 95  Ht 5' 3.5" (1.613 m)  Wt 106 lb (48.081 kg)  BMI 18.48 kg/m2 Well developed and well nourished in no acute distress HENT normal E scleral and icterus clear Neck Supple JVP flat; carotids brisk and full Clear to ausculation irregularly irregular   no murmurs gallops or  rub Soft with active bowel sounds No clubbing cyanosis none Edema Alert and oriented, grossly normal motor and sensory function Skin Warm and Dry  The echocardiogram demonstrates atrial fibrillation at 95 Intervals-/08/35 with a QTC of 44 Assessment and  Plan  

## 2012-04-02 NOTE — Transfer of Care (Signed)
Immediate Anesthesia Transfer of Care Note  Patient: Connie Chase  Procedure(s) Performed: Procedure(s) (LRB): CARDIOVERSION (N/A)  Patient Location: Short Stay  Anesthesia Type: General  Level of Consciousness: awake, oriented and patient cooperative  Airway & Oxygen Therapy: Patient Spontanous Breathing and Patient connected to nasal cannula oxygen  Post-op Assessment: Report given to PACU RN and Post -op Vital signs reviewed and stable  Post vital signs: Reviewed and stable  Complications: No apparent anesthesia complications

## 2012-04-06 ENCOUNTER — Encounter (HOSPITAL_COMMUNITY): Payer: Self-pay | Admitting: Internal Medicine

## 2012-04-07 ENCOUNTER — Ambulatory Visit (INDEPENDENT_AMBULATORY_CARE_PROVIDER_SITE_OTHER): Payer: BC Managed Care – PPO | Admitting: Physician Assistant

## 2012-04-07 ENCOUNTER — Encounter: Payer: Self-pay | Admitting: Physician Assistant

## 2012-04-07 DIAGNOSIS — I4891 Unspecified atrial fibrillation: Secondary | ICD-10-CM

## 2012-04-07 NOTE — Procedures (Signed)
Exercise Treadmill Test  Pre-Exercise Testing Evaluation Rhythm: sinus bradycardia  Rate: 53   PR:  .19 QRS:  .09  QT:  .48 QTc: .45     Test  Exercise Tolerance Test Ordering MD: Sherryl Manges, MD  Interpreting MD: Tereso Newcomer PA-C  Unique Test No: 1  Treadmill:  1  Indication for ETT: A-FIB  Contraindication to ETT: No   Stress Modality: exercise - treadmill  Cardiac Imaging Performed: non   Protocol: modified Bruce - maximal  Max BP:  140/71  Max MPHR (bpm):  152 85% MPR (bpm):  129  MPHR obtained (bpm):  82 % MPHR obtained:  53%  Reached 85% MPHR (min:sec):  N/A Total Exercise Time (min-sec):  11:05  Workload in METS:  7.2 Borg Scale: 17  Reason ETT Terminated:  patient's desire to stop    ST Segment Analysis At Rest: normal ST segments - no evidence of significant ST depression With Exercise: non-specific ST changes  Other Information Arrhythmia:  No Angina during ETT:  absent (0) Quality of ETT:  non-diagnostic  ETT Interpretation:  normal - no evidence of ischemia by ST analysis at submaximal exercise.   Comments: Poor exercise tolerance. No chest pain. Normal BP response to exercise. No ST-T changes to suggest ischemia at submaximal exercise. No exercise induced ventricular arrhythmias.    Recommendations: Patient requested to stop test due to fatigue. I asked her several times if we got her to her maximal limit of exercise. It is not entirely clear, but after asking her several times, she notes she was close to her limit of activity. Follow up with Dr. Sherryl Manges as directed. Tereso Newcomer, PA-C  9:08 AM 04/07/2012

## 2012-04-08 ENCOUNTER — Ambulatory Visit (HOSPITAL_COMMUNITY)
Admission: RE | Admit: 2012-04-08 | Discharge: 2012-04-08 | Disposition: A | Discharge status: Home / Self Care | Payer: BC Managed Care – PPO | Source: Ambulatory Visit | Attending: Gastroenterology | Admitting: Gastroenterology

## 2012-04-08 DIAGNOSIS — B181 Chronic viral hepatitis B without delta-agent: Secondary | ICD-10-CM | POA: Insufficient documentation

## 2012-04-15 ENCOUNTER — Ambulatory Visit (INDEPENDENT_AMBULATORY_CARE_PROVIDER_SITE_OTHER): Payer: BC Managed Care – PPO | Admitting: Gastroenterology

## 2012-04-15 DIAGNOSIS — B181 Chronic viral hepatitis B without delta-agent: Secondary | ICD-10-CM

## 2012-04-15 LAB — HEPATIC FUNCTION PANEL
ALT: 134 U/L — ABNORMAL HIGH (ref 0–35)
AST: 67 U/L — ABNORMAL HIGH (ref 0–37)
Albumin: 4.1 g/dL (ref 3.5–5.2)
Bilirubin, Direct: 0.1 mg/dL (ref 0.0–0.3)

## 2012-04-15 LAB — AFP TUMOR MARKER: AFP-Tumor Marker: 2.8 ng/mL (ref 0.0–8.0)

## 2012-04-16 LAB — HEPATITIS B SURFACE ANTIGEN: Hepatitis B Surface Ag: POSITIVE — AB

## 2012-04-19 LAB — HEPATITIS B E ANTIBODY: Hepatitis Be Antibody: POSITIVE — AB

## 2012-04-19 LAB — HEPATITIS B E ANTIGEN: Hepatitis Be Antigen: NEGATIVE

## 2012-04-22 ENCOUNTER — Encounter: Payer: Self-pay | Admitting: Gastroenterology

## 2012-04-22 NOTE — Progress Notes (Signed)
NAME:  Connie Chase, Connie Chase    MR#:  [id number]      DATE:  04/15/2012  DOB:  Feb 07, 1946    cc: Consulting Physician: Stefanie Libel, MD, [facility], [address], [address], [place], [place] [address], [place] [phone]  Primary Care Physician: Shaune Pollack, MD, [facility] [place], [address], [address], [place], [place] [address], Phone [phone]  Referring Physician: Dorena Cookey, MD, [facility], [address], [address], [place], [place] [address], Fax [phone]  Sherryl Manges, MD, [facility]- [address], [address], [place], [place] [address], Fax [phone]    REASON FOR VISIT:  Follow up of genotype B, E antibody positive HBV.   HISTORY:  The patient returns today unaccompanied. Since last being seen on 01/08/2012, there has been no interval development of symptoms referable to her history of hepatitis B. There are no symptoms to suggest decompensated liver disease or vasculitis.   PAST MEDICAL HISTORY:  The patient has undergone cardioversion for her atrial fibrillation since last being seen.   CURRENT MEDICATIONS:  1. Tenofovir 300 mg daily.  2. Propranolol 10 mg t.i.d.  3. Flecainide 100 mg b.i.d.  4. Pradaxa 150 mg b.i.d. 5. Beta carotene with minerals b.i.d.  6. Magnesium 250 mg daily.  7. Multivitamin daily.  8. Omega-3 fatty acids 1200 mg daily.   ALLERGIES:  Denies.   HABITS:  Smoking never. Alcohol denies interval consumption.   REVIEW OF SYSTEMS:  All 10 systems reviewed today with the patient and they are negative other than which was mentioned above. CES-D was 29.   PHYSICAL EXAMINATION:  Constitutional: Well appearing without stigmata of chronic liver disease. Vital signs: Height 63.5 inches, weight 104 pounds, blood pressure 126/68, pulse of 50, temperature 96 Fahrenheit.   LABORATORIES:  Previous lab work reviewed with the patient.   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 Her pretreatment viral load on 03/12/2000, was 1,024,570 international units per mL. Approximately 2 months into therapy on 06/11/2010, her viral load was 250 international units per mL. On 09/19/2010, at week 27, her viral load was detectable but less than 20 international units per mL. At week 41 on 12/26/2010, it was undetectable. It remained so on 09/25/2011, week 77.  At week 92, when last seen remained detectable but not quantifiable.   In terms of hepatitis B care, she is hepatitis A immune. She underwent her hepatocellular cancer (HCC) screening by ultrasound on 04/08/2012.   In my discussion today with the patient, we discussed the results of previous lab work. She asked about the safety of using flecainide and propranolol, which I reassured her there was no problem in terms of her liver disease.   PLAN:  1. Standard labs today.  2. Hepatitis A immune.  3. Book for ultrasound in 6 months' time.  4. Return in 6 months' time because of stability of disease.  5. I will mail her a copy of the lab results when they are available.               Brooke Dare, MD  ADDENDUM:  Liver tests reviewed.  HBV DNA suppressed.  AST and ALT may be still elevated from medication changes around the time of her recent cardioversion but not significant, esp. since HBV DNA is still suppressed.   403 .  [id number]  D:  Thu Jun 06 20:39:42 2013 ; T:  Thu Jun 06 23:56:21 2013  Job #:  [id number]

## 2012-04-26 ENCOUNTER — Encounter: Payer: Self-pay | Admitting: Internal Medicine

## 2012-04-26 ENCOUNTER — Ambulatory Visit (INDEPENDENT_AMBULATORY_CARE_PROVIDER_SITE_OTHER): Payer: BC Managed Care – PPO | Admitting: Internal Medicine

## 2012-04-26 VITALS — BP 110/60 | HR 54 | Ht 63.0 in | Wt 105.8 lb

## 2012-04-26 DIAGNOSIS — I4891 Unspecified atrial fibrillation: Secondary | ICD-10-CM

## 2012-04-26 MED ORDER — FLECAINIDE ACETATE 150 MG PO TABS: ORAL_TABLET | ORAL | Status: DC

## 2012-04-26 NOTE — Patient Instructions (Signed)
Your physician has recommended you make the following change in your medication:  1) decrease flecainide to 150 mg 1/2 tablet by mouth twice daily.  Your physician wants you to follow-up in:  4 months with Dr. Graciela Husbands. You will receive a reminder letter in the mail two months in advance. If you don't receive a letter, please call our office to schedule the follow-up appointment.

## 2012-04-26 NOTE — Progress Notes (Signed)
  HPI  Connie Chase is a 66 y.o. female Seen following dccv for atrial fibrillation //flutter.  She has had some difficulty tolerating intermittent dizziness point of view. This has gradually lessened. She prefers sinus rhythm to atrial fibrillation.    Past Medical History  Diagnosis Date  . Atrial fibrillation     s/p DCCV in 3/11  . Other malaise and fatigue   . Lower extremity edema     Right  . Hepatitis B infection   . Hemorrhage anterior chamber eye     On pradaxa    Past Surgical History  Procedure Date  . Cardioversion 04/02/2012    Procedure: CARDIOVERSION;  Surgeon: Duke Salvia, MD;  Location: [facility] OR;  Service: Cardiovascular;  Laterality: N/A;    Current Outpatient Prescriptions  Medication Sig Dispense Refill  . beta carotene w/minerals (OCUVITE) tablet Take 1 tablet by mouth daily.       . dabigatran (PRADAXA) 150 MG CAPS Take 1 capsule (150 mg total) by mouth 2 (two) times daily.  180 capsule  2  . flecainide (TAMBOCOR) 100 MG tablet Take 1 tablet (100 mg total) by mouth 2 (two) times daily.  60 tablet  6  . Magnesium 250 MG TABS Take 1 tablet by mouth daily.        . Multiple Vitamin (MULTIVITAMIN) tablet Take 1 tablet by mouth daily.        . Omega-3 Fatty Acids (FISH OIL) 1200 MG CAPS Take 1 capsule by mouth daily.        . propranolol (INDERAL) 10 MG tablet Take 1 tablet (10 mg total) by mouth 3 (three) times daily.  90 tablet  6    No Known Allergies  Review of Systems negative except from HPI and PMH  Physical Exam BP 110/60  Pulse 54  Ht 5\' 3"  (1.6 m)  Wt 105 lb 12 oz (47.968 kg)  BMI 18.73 kg/m2 Well developed and well nourished in no acute distress HENT normal E scleral and icterus clear Neck Supple JVP flat; carotids brisk and full Clear to ausculation slow but*Regular rate and rhythm, no murmurs gallops or rub Soft with active bowel sounds No clubbing cyanosis none Edema with some erythema on her ankles bilaterally Alert and oriented,  grossly normal motor and sensory function Skin Warm and Dry  Electrocardiogram demonstrates sinus rhythm at 54 Intervals 18/09/46 Axis is 46 RSR prime  Assessment and  Plan

## 2012-04-26 NOTE — Assessment & Plan Note (Signed)
She is holding sinus rhythm on flecainide. We'll reduce the dose from 100-75 mg twice daily because of side effects in hopes that we can control her atrial fibrillation and reduce the adverse consequences.

## 2012-05-03 ENCOUNTER — Telehealth: Payer: Self-pay | Admitting: Internal Medicine

## 2012-05-03 DIAGNOSIS — I4891 Unspecified atrial fibrillation: Secondary | ICD-10-CM

## 2012-05-03 MED ORDER — PROPRANOLOL HCL 10 MG PO TABS: 10.0000 mg | ORAL_TABLET | Freq: Three times a day (TID) | ORAL | Status: DC

## 2012-05-03 NOTE — Telephone Encounter (Signed)
New Problem:    Called in wanting to change her refill amount from 30 to 90 days from now on of her propranolol (INDERAL) 10 MG tablet.

## 2012-05-06 ENCOUNTER — Telehealth: Payer: Self-pay | Admitting: Internal Medicine

## 2012-05-06 DIAGNOSIS — I4891 Unspecified atrial fibrillation: Secondary | ICD-10-CM

## 2012-05-06 MED ORDER — FLECAINIDE ACETATE 50 MG PO TABS: 50.0000 mg | ORAL_TABLET | Freq: Two times a day (BID) | ORAL | Status: DC

## 2012-05-06 NOTE — Telephone Encounter (Signed)
I spoke with the patient. She is complaining of continued dizziness and trouble with her balance on flecainide 75 mg twice daily. She would like to try decreasing the dose further. She states she has maintained in normal sinus rhythm since we saw her last. I explained I will review with Dr. Graciela Husbands and call her back. [facility] [address] [address]

## 2012-05-06 NOTE — Telephone Encounter (Signed)
RX sent to the patient's pharmacy for flecainide 50 mg BID. The patient is aware of the medication change. I have advised she may have more break through a-fib. I have instructed her to call back should her dizziness not improve or she have more a-fib. She voices understanding.

## 2012-05-06 NOTE — Telephone Encounter (Signed)
Lets try and reduce to 50 bid  If that doesnot work we can try rythmol  150 bid

## 2012-05-06 NOTE — Telephone Encounter (Signed)
Pt having trouble with the flecainide ,  75 mg two times a day, feels its too much, very tired, wants to try 50 mg a day,  pls advise [phone]

## 2012-06-01 ENCOUNTER — Other Ambulatory Visit: Payer: Self-pay | Admitting: Internal Medicine

## 2012-06-01 DIAGNOSIS — I4891 Unspecified atrial fibrillation: Secondary | ICD-10-CM

## 2012-06-01 NOTE — Telephone Encounter (Signed)
Patient request 90  Day supply

## 2012-06-01 NOTE — Telephone Encounter (Signed)
Called pharmacy to verify that there are refills remaining on the [facility], received a request from pharmacy for a refill.

## 2012-06-02 ENCOUNTER — Other Ambulatory Visit: Payer: Self-pay | Admitting: *Deleted

## 2012-06-02 DIAGNOSIS — I4891 Unspecified atrial fibrillation: Secondary | ICD-10-CM

## 2012-06-02 NOTE — Telephone Encounter (Deleted)
Talked with patient today and sent in her prescription to [facility] for flecanide.

## 2012-07-29 ENCOUNTER — Encounter: Payer: Self-pay | Admitting: Gastroenterology

## 2012-09-03 ENCOUNTER — Encounter: Payer: Self-pay | Admitting: Internal Medicine

## 2012-09-03 ENCOUNTER — Ambulatory Visit (INDEPENDENT_AMBULATORY_CARE_PROVIDER_SITE_OTHER): Payer: BC Managed Care – PPO | Admitting: Internal Medicine

## 2012-09-03 VITALS — BP 134/72 | HR 59 | Wt 104.0 lb

## 2012-09-03 DIAGNOSIS — I4891 Unspecified atrial fibrillation: Secondary | ICD-10-CM

## 2012-09-03 MED ORDER — FLECAINIDE ACETATE 50 MG PO TABS: ORAL_TABLET | ORAL | Status: DC

## 2012-09-03 NOTE — Patient Instructions (Signed)
Your physician wants you to follow-up in: 6 MONTHS WITH DR Logan Bores will receive a reminder letter in the mail two months in advance. If you don't receive a letter, please call our office to schedule the follow-up appointment.   CHANGE FLECAINIDE TO ONE HALF TABLET IN THE MORNING AND ONE TABLET IN THE EVENING

## 2012-09-03 NOTE — Progress Notes (Signed)
Patient Care Team: Hollice Espy, MD as PCP - General (Family Medicine)   HPI  Connie Chase is a 66 y.o. female Seen following dccv for atrial fibrillation //flutter.  She has had some difficulty tolerating intermittent dizziness point of view. This has gradually lessened. She prefers sinus rhythm to atrial fibrillation does not like the flecainide  because of associated dizziness which she is willing to take it because of her preference for sinus rhythm. Initially at 50 mg twice daily, she was feeling good subsequently she has had more problems with dizziness  Past Medical History  Diagnosis Date  . Atrial fibrillation     s/p DCCV in 3/11  . Other malaise and fatigue   . Lower extremity edema     Right  . Hepatitis B infection   . Hemorrhage anterior chamber eye     On pradaxa    Past Surgical History  Procedure Date  . Cardioversion 04/02/2012    Procedure: CARDIOVERSION;  Surgeon: Duke Salvia, MD;  Location: [facility] OR;  Service: Cardiovascular;  Laterality: N/A;    Current Outpatient Prescriptions  Medication Sig Dispense Refill  . beta carotene w/minerals (OCUVITE) tablet Take 1 tablet by mouth daily.       . dabigatran (PRADAXA) 150 MG CAPS Take 1 capsule (150 mg total) by mouth 2 (two) times daily.  180 capsule  2  . flecainide (TAMBOCOR) 50 MG tablet Take 1 tablet (50 mg total) by mouth 2 (two) times daily.  60 tablet  6  . Magnesium 250 MG TABS Take 1 tablet by mouth daily.        . Multiple Vitamin (MULTIVITAMIN) tablet Take 1 tablet by mouth daily.        . Omega-3 Fatty Acids (FISH OIL) 1200 MG CAPS Take 1 capsule by mouth daily.        . propranolol (INDERAL) 10 MG tablet Take 1 tablet (10 mg total) by mouth 3 (three) times daily.  270 tablet  1    No Known Allergies  Review of Systems negative except from HPI and PMH  Physical Exam BP 134/72  Pulse 59  Wt 104 lb (47.174 kg) Well developed and well nourished in no acute distress HENT normal E scleral and  icterus clear Neck Supple JVP flat; carotids brisk and full Clear to ausculation  Regular rate and rhythm, no murmurs gallops or rub Soft with active bowel sounds No clubbing cyanosis none Edema Alert and oriented, grossly normal motor and sensory function Skin Warm and Dry  Electrocardiogram demonstrates sinus rhythm at 59 Intervals 15/09/41 Axis is 60  Assessment and  Plan

## 2012-09-03 NOTE — Assessment & Plan Note (Signed)
paroxyzmal atrial fibrillation continue flecanide  Although will decrease flec to 25/50 inhopes of lss dizziness

## 2012-09-20 ENCOUNTER — Other Ambulatory Visit: Payer: Self-pay | Admitting: Internal Medicine

## 2012-09-20 MED ORDER — DABIGATRAN ETEXILATE MESYLATE 150 MG PO CAPS: 150.0000 mg | ORAL_CAPSULE | Freq: Two times a day (BID) | ORAL | Status: DC

## 2012-09-23 ENCOUNTER — Other Ambulatory Visit: Payer: Self-pay | Admitting: *Deleted

## 2012-09-23 NOTE — Telephone Encounter (Signed)
Opened in Error.

## 2012-10-13 ENCOUNTER — Ambulatory Visit (HOSPITAL_COMMUNITY)
Admission: RE | Admit: 2012-10-13 | Discharge: 2012-10-13 | Disposition: A | Discharge status: Home / Self Care | Payer: BC Managed Care – PPO | Source: Ambulatory Visit | Attending: Gastroenterology | Admitting: Gastroenterology

## 2012-10-13 DIAGNOSIS — B181 Chronic viral hepatitis B without delta-agent: Secondary | ICD-10-CM | POA: Insufficient documentation

## 2012-10-18 ENCOUNTER — Other Ambulatory Visit: Payer: Self-pay | Admitting: Internal Medicine

## 2012-10-19 NOTE — Telephone Encounter (Signed)
F/u   Pt call for status on this medication.  Pt would like return call regarding authorization, she can be reached at [phone]. [facility] [address] waiting for authorization reply.

## 2013-02-09 ENCOUNTER — Encounter: Payer: Self-pay | Admitting: Internal Medicine

## 2013-02-09 ENCOUNTER — Ambulatory Visit (INDEPENDENT_AMBULATORY_CARE_PROVIDER_SITE_OTHER): Payer: BC Managed Care – PPO | Admitting: Internal Medicine

## 2013-02-09 VITALS — BP 129/52 | HR 67 | Ht 62.5 in | Wt 102.4 lb

## 2013-02-09 DIAGNOSIS — I4891 Unspecified atrial fibrillation: Secondary | ICD-10-CM

## 2013-02-09 DIAGNOSIS — L659 Nonscarring hair loss, unspecified: Secondary | ICD-10-CM | POA: Insufficient documentation

## 2013-02-09 MED ORDER — DILTIAZEM HCL 30 MG PO TABS: 30.0000 mg | ORAL_TABLET | Freq: Three times a day (TID) | ORAL | Status: DC

## 2013-02-09 MED ORDER — DILTIAZEM HCL ER COATED BEADS 120 MG PO CP24: 120.0000 mg | ORAL_CAPSULE | Freq: Every day | ORAL | Status: DC

## 2013-02-09 NOTE — Patient Instructions (Addendum)
Your physician has recommended you make the following change in your medication: STOP your Propranolol (Inderal) and START Cardizem 30 mg tid  Your physician wants you to follow-up in: 6 months.   You will receive a reminder letter in the mail two months in advance. If you don't receive a letter, please call our office to schedule the follow-up appointment.

## 2013-02-09 NOTE — Assessment & Plan Note (Addendum)
Stable   We spent 25 min or so discussing atrial fib and the issues of anticoagulatoin

## 2013-02-09 NOTE — Assessment & Plan Note (Signed)
We'll stop his beta blocker. We'll begin her on calcium blocker. We have some concerns about bradycardia. Hopefully that will not be an issue.

## 2013-02-09 NOTE — Progress Notes (Signed)
Patient Care Team: Hollice Espy, MD as PCP - General (Family Medicine)   HPI  Connie Chase is a 67 y.o. female Seen following dccv for atrial fibrillation //flutter.  She has had some difficulty tolerating intermittent dizziness point of view. This has gradually lessened. She prefers sinus rhythm to atrial fibrillation but does not like the flecainide  because of associated dizziness    At the last visit we decreased her flecainide from 50 twice a day to 25/50  Tolerating interms of dizziness also c/o alopecia  Past Medical History  Diagnosis Date  . Atrial fibrillation     s/p DCCV in 3/11  . Other malaise and fatigue   . Lower extremity edema     Right  . Hepatitis B infection   . Hemorrhage anterior chamber eye     On pradaxa    Past Surgical History  Procedure Laterality Date  . Cardioversion  04/02/2012    Procedure: CARDIOVERSION;  Surgeon: Duke Salvia, MD;  Location: [facility] OR;  Service: Cardiovascular;  Laterality: N/A;    Current Outpatient Prescriptions  Medication Sig Dispense Refill  . beta carotene w/minerals (OCUVITE) tablet Take 1 tablet by mouth daily.       . flecainide (TAMBOCOR) 50 MG tablet Take one half tablet in the morning and one tablet in the evening  60 tablet  6  . Magnesium 250 MG TABS Take 1 tablet by mouth daily.        . Multiple Vitamin (MULTIVITAMIN) tablet Take 1 tablet by mouth daily.        . Omega-3 Fatty Acids (FISH OIL) 1200 MG CAPS Take 1 capsule by mouth daily.        . propranolol (INDERAL) 10 MG tablet TAKE 1 TABLET BY MOUTH 3 TIMES A DAY  270 tablet  1  . dabigatran (PRADAXA) 150 MG CAPS Take 1 capsule (150 mg total) by mouth 2 (two) times daily.  180 capsule  2   No current facility-administered medications for this visit.    No Known Allergies  Review of Systems negative except from HPI and PMH  Physical Exam BP 129/52  Pulse 67  Ht 5' 2.5" (1.588 m)  Wt 102 lb 6.4 oz (46.448 kg)  BMI 18.42 kg/m2 Well developed and  well nourished in no acute distress HENT normal E scleral and icterus clear Neck Supple JVP flat; carotids brisk and full Clear to ausculation  Regular rate and rhythm, no murmurs gallops or rub Soft with active bowel sounds No clubbing cyanosis none Edema Alert and oriented, grossly normal motor and sensory function Skin Warm and Dry  Electrocardiogram demonstrates sinus rhythm at 66 Intervals 15/09/41 Axis is 60  Assessment and  Plan

## 2013-02-18 IMAGING — US US ABDOMEN COMPLETE
1 series · 14 of 25 positions shown · non-contrast
Comparison: 09/12/2010

CLINICAL DATA: Hepatitis B.

COMPLETE ABDOMINAL ULTRASOUND

[Series 1: us abdomen complete · 0.20mm/px · 14 of 73 slices shown]
[im 1/73]
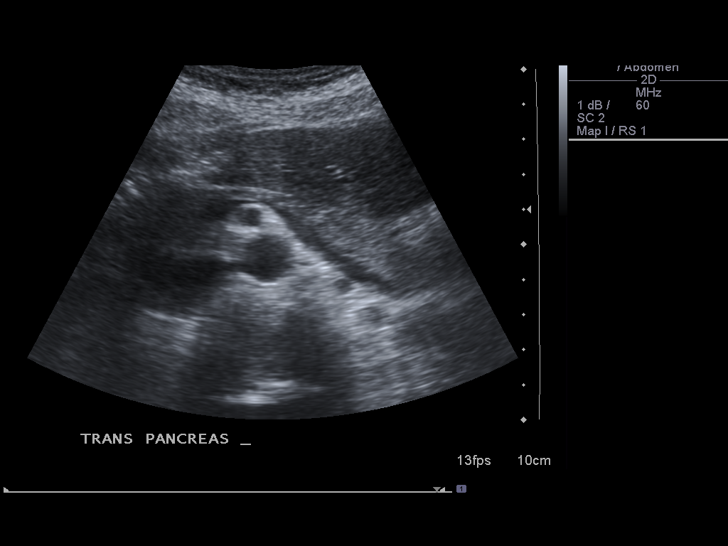
[im 7/73]
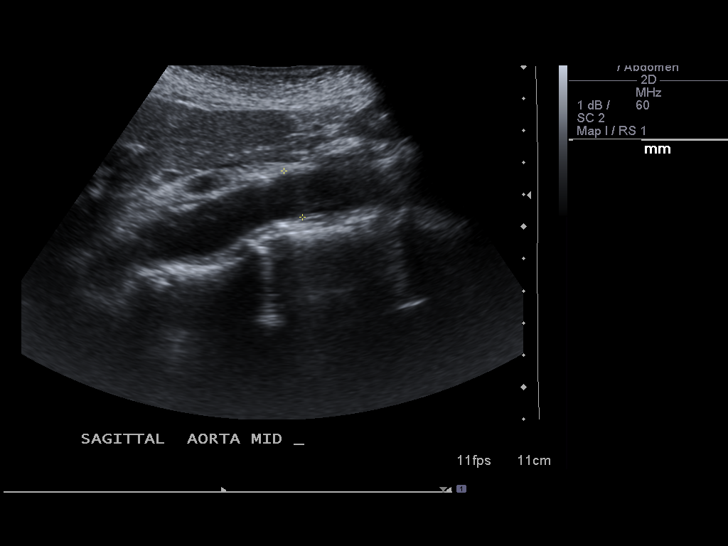
[im 13/73]
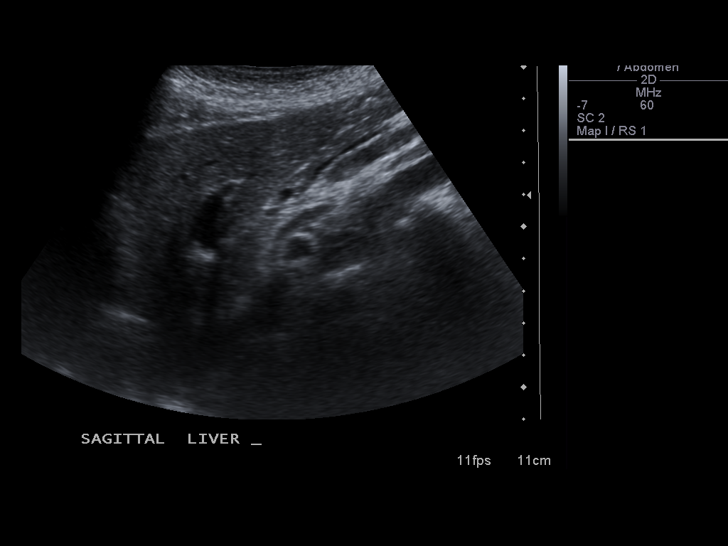
[im 19/73]
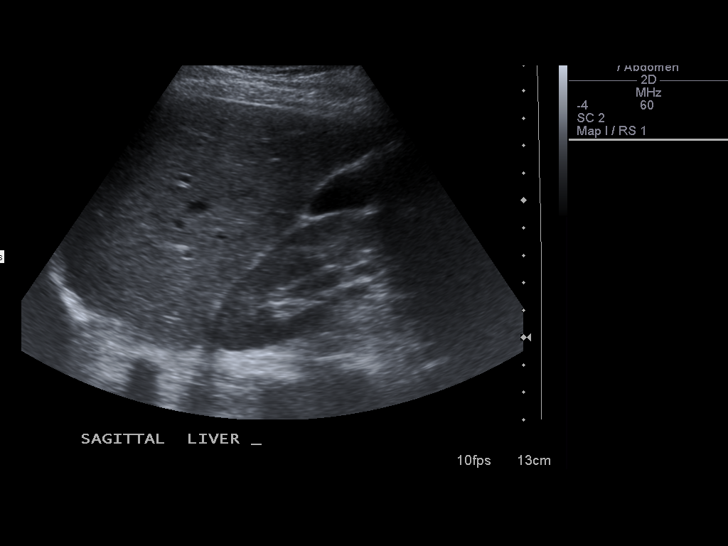
[im 25/73]
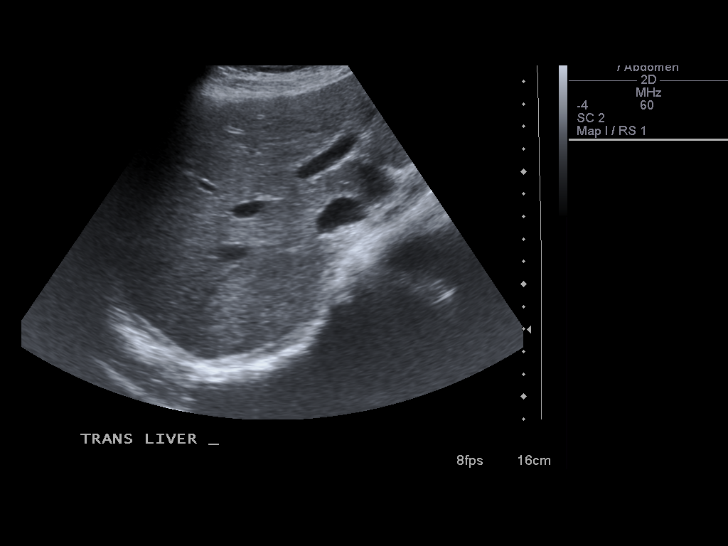
[im 28/73]
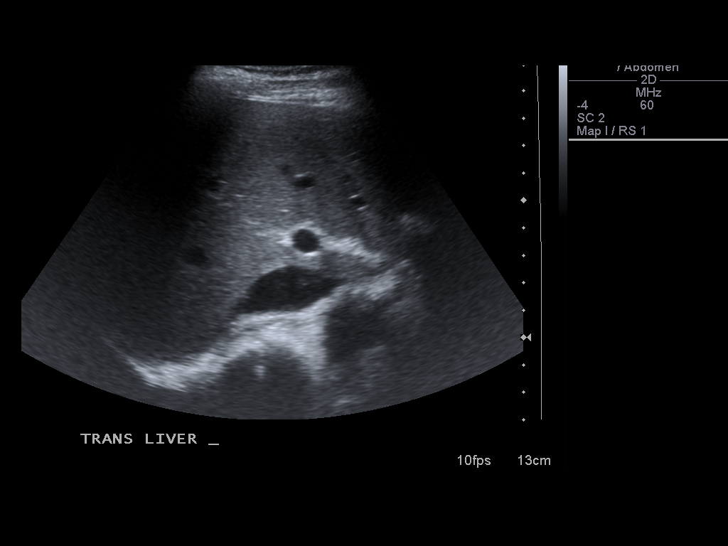
[im 34/73]
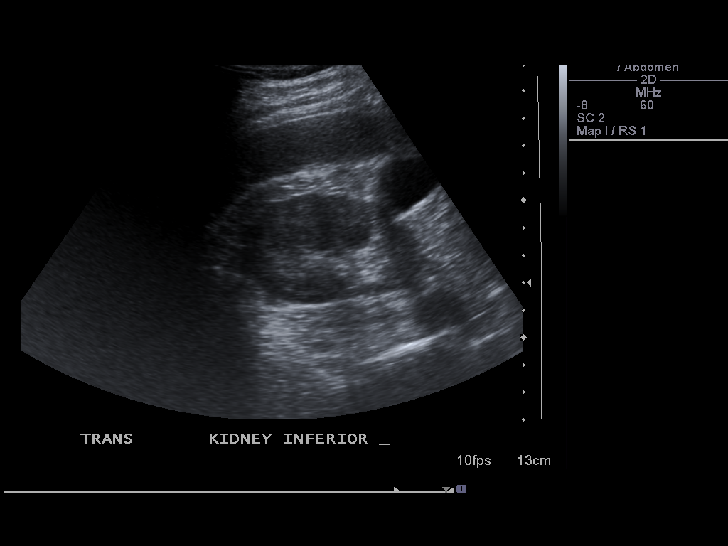
[im 40/73]
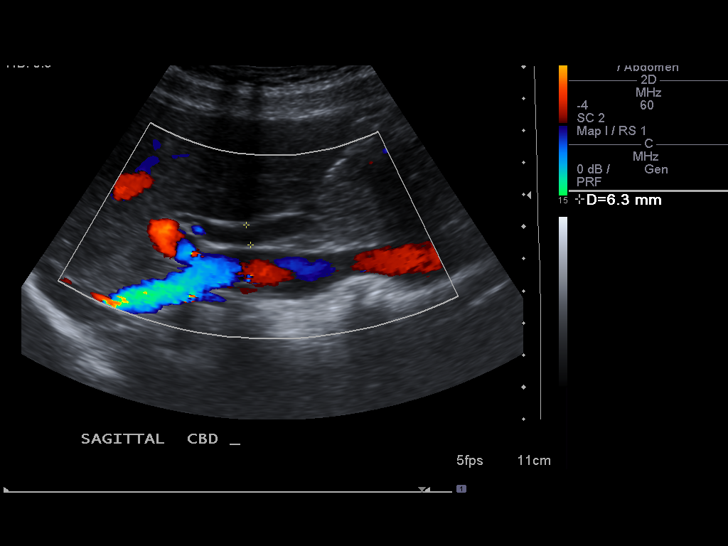
[im 46/73]
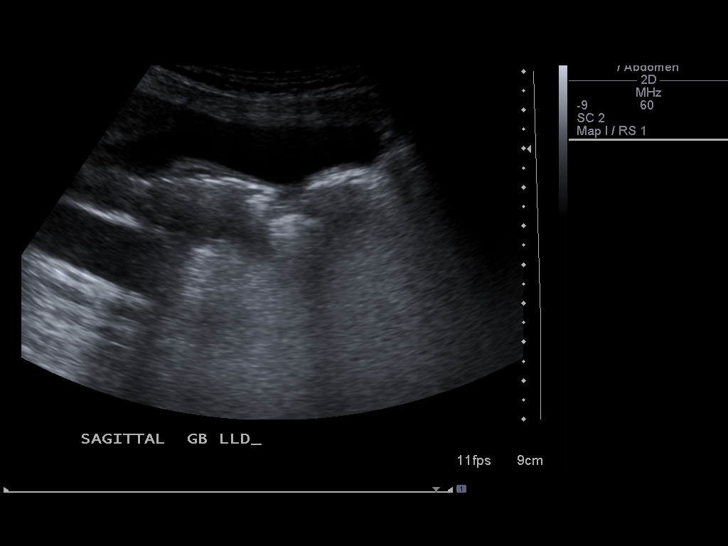
[im 49/73]
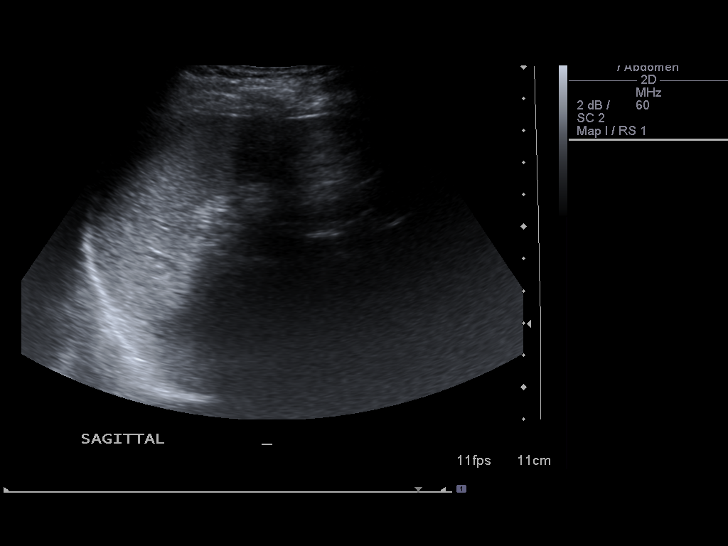
[im 55/73]
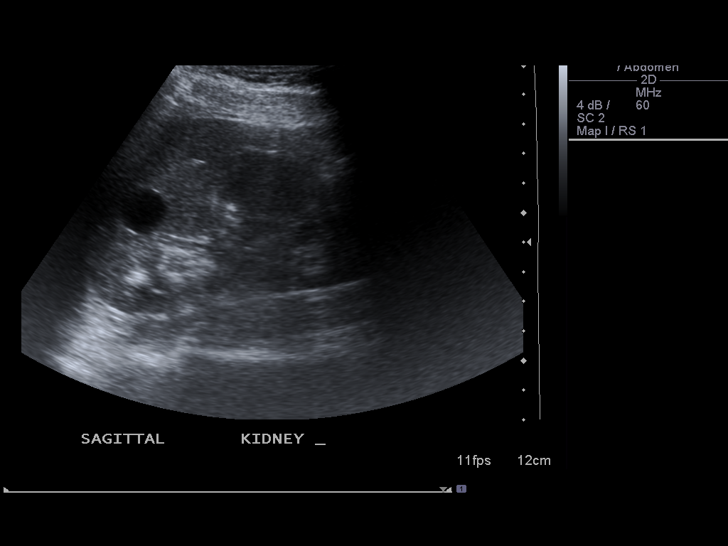
[im 61/73]
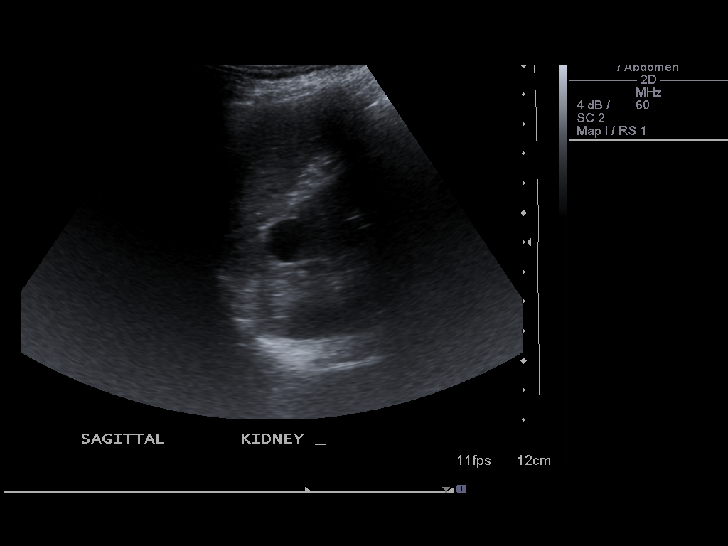
[im 67/73]
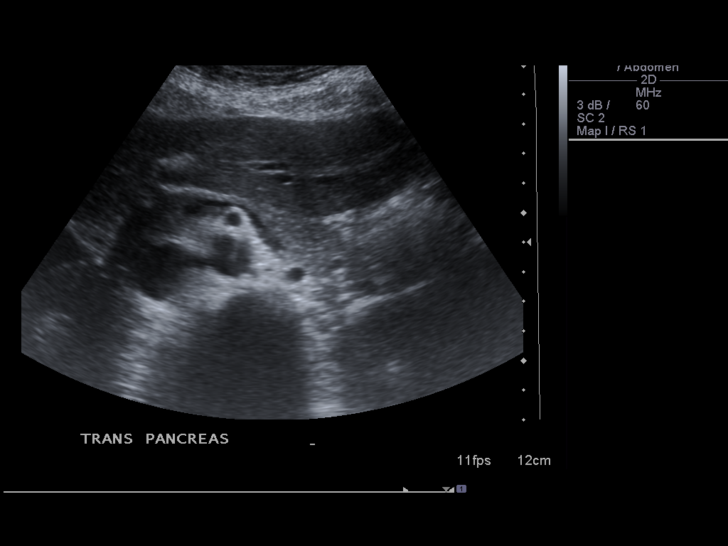
[im 73/73]
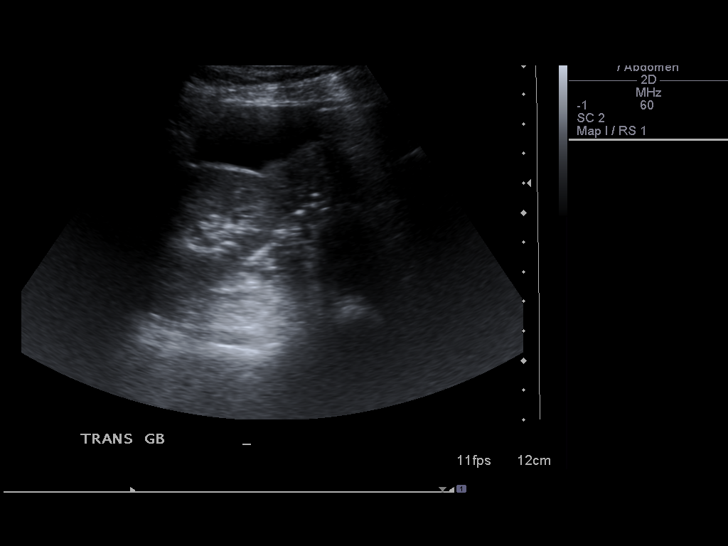

[14 of 25 positions shown; findings below may reference images not displayed]

FINDINGS: Gallbladder:  No gallstones, gallbladder wall thickening, or
pericholecystic fluid.

Common bile duct:   Upper limits normal in caliber at 6 mm, stable.

Liver:  No focal lesion identified.  Within normal limits in
parenchymal echogenicity.

IVC:  Appears normal.

Pancreas:  No focal abnormality seen.

Spleen:  Within normal limits in size and echotexture.

Right Kidney:   Normal in size and parenchymal echogenicity.  No
evidence of mass or hydronephrosis.

Left Kidney:  1 cm simple appearing cyst in the upper pole of the
left kidney.  No hydronephrosis.  Normal size and echotexture.

Abdominal aorta:  No aneurysm identified.
IMPRESSION: Negative abdominal ultrasound. No focal abnormality within the
liver.

Stable benign-appearing left renal cyst.

## 2013-06-13 ENCOUNTER — Other Ambulatory Visit: Payer: Self-pay | Admitting: Gastroenterology

## 2013-06-13 DIAGNOSIS — B1920 Unspecified viral hepatitis C without hepatic coma: Secondary | ICD-10-CM

## 2013-06-29 ENCOUNTER — Ambulatory Visit (INDEPENDENT_AMBULATORY_CARE_PROVIDER_SITE_OTHER): Payer: BC Managed Care – PPO | Admitting: Obstetrics and Gynecology

## 2013-06-29 ENCOUNTER — Encounter: Payer: Self-pay | Admitting: Obstetrics and Gynecology

## 2013-06-29 VITALS — BP 98/60 | HR 72 | Resp 12 | Ht 63.0 in | Wt 104.6 lb

## 2013-06-29 DIAGNOSIS — Z01419 Encounter for gynecological examination (general) (routine) without abnormal findings: Secondary | ICD-10-CM

## 2013-06-29 NOTE — Patient Instructions (Signed)

## 2013-06-29 NOTE — Progress Notes (Signed)
67 y.o.   Married    Asian   female   G2P2   here for annual exam.  No vag bleeding    No LMP recorded. Patient is postmenopausal.          Sexually active: no  The current method of family planning is vasectomy.    Exercising: treadmill and stationary bike.  Last mammogram:  06/23/2012 declines this year Last pap smear:05/28/2010 negative History of abnormal pap: no  Smoking:no Alcohol:no Last colonoscopy: 2002 Last Bone Density: 06/23/2012  Last tetanus shot: 2008 Last cholesterol check: 02/2013  Hgb: PCP maintains all labs.                Urine: PCP maintains urine.    Family History  Problem Relation Age of Onset  . CVA Maternal Grandfather     Patient Active Problem List   Diagnosis Date Noted  . Alopecia 02/09/2013  . Palpitations 06/13/2011  . Chest pain 06/13/2011  . ATRIAL FIBRILLATION 12/31/2009  . FATIGUE 12/31/2009    Past Medical History  Diagnosis Date  . Atrial fibrillation     s/p DCCV in 3/11  . Other malaise and fatigue   . Lower extremity edema     Right  . Hepatitis B infection     Chronic Hep B carrier, on suppressive Tx  . Hemorrhage anterior chamber eye     On pradaxa  . Fibroid   . Gestational diabetes     x 2  . Anemia     as a child  . 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 Hayfever     allergic to    Past Surgical History  Procedure Laterality Date  . Cardioversion  04/02/2012    Procedure: CARDIOVERSION;  Surgeon: Duke Salvia, MD;  Location: [facility] OR;  Service: Cardiovascular;  Laterality: N/A;  . Cataract extraction Bilateral 03/2011 2013    03/2011 left, 2013 right    Allergies: Review of patient's allergies indicates no known allergies.  Current Outpatient Prescriptions  Medication Sig Dispense Refill  . beta carotene w/minerals (OCUVITE) tablet Take 1 tablet by mouth daily.       Marland Kitchen diltiazem (CARDIZEM) 30 MG tablet Take 1 tablet (30 mg total) by mouth 3 (three) times daily.  90 tablet  6  . flecainide (TAMBOCOR) 50 MG  tablet Take one half tablet in the morning and one tablet in the evening  60 tablet  6  . Magnesium 250 MG TABS Take 1 tablet by mouth daily.        . Multiple Vitamin (MULTIVITAMIN) tablet Take 1 tablet by mouth daily.        . Omega-3 Fatty Acids (FISH OIL) 1200 MG CAPS Take 1 capsule by mouth daily.        No current facility-administered medications for this visit.    ROS: Pertinent items are noted in HPI.  Social Hx:  Married, two children  Exam:    BP 98/60  Pulse 72  Resp 12  Ht 5\' 3"  (1.6 m)  Wt 104 lb 9.6 oz (47.446 kg)  BMI 18.53 kg/m2 Ht and wt stable from last year  Wt Readings from Last 3 Encounters:  06/29/13 104 lb 9.6 oz (47.446 kg)  02/09/13 102 lb 6.4 oz (46.448 kg)  09/03/12 104 lb (47.174 kg)     Ht Readings from Last 3 Encounters:  06/29/13 5\' 3"  (1.6 m)  02/09/13 5' 2.5" (1.588 m)  04/26/12 5\' 3"  (1.6 m)  General appearance: alert, cooperative and appears stated age Head: Normocephalic, without obvious abnormality, atraumatic Neck: no adenopathy, supple, symmetrical, trachea midline and thyroid not enlarged, symmetric, no tenderness/mass/nodules Lungs: clear to auscultation bilaterally Breasts: Inspection negative, No nipple retraction or dimpling, No nipple discharge or bleeding, No axillary or supraclavicular adenopathy, Normal to palpation without dominant masses Heart:irregularly irregular rhythm Abdomen: soft, non-tender; bowel sounds normal; no masses,  no organomegaly Extremities: extremities normal, atraumatic, no cyanosis or edema Skin: Skin color, texture, turgor normal. No rashes or lesions Lymph nodes: Cervical, supraclavicular, and axillary nodes normal. No abnormal inguinal nodes palpated Neurologic: Grossly normal   Pelvic: External genitalia:  no lesions              Urethra:  normal appearing urethra with no masses, tenderness or lesions              Bartholins and Skenes: normal                 Vagina: normal appearing vagina  with normal color and discharge, no lesions              Cervix: normal appearance              Pap taken: no        Bimanual Exam:  Uterus:  uterus is normal size, shape, consistency and nontender                                      Adnexa: normal adnexa in size, nontender and no masses                                      Rectovaginal: Confirms                                      Anus:  normal sphincter tone, no lesions  A: normal menopausal exam, no HRT     Chronic Hep B carrier, on suppression     S/p cardioversion for A fib in 2011, on pradaxa, see cardiologist regularly     P:     Mammogram next year  counseled on breast self exam, mammography screening, adequate intake of calcium and vitamin D, diet and exercise return annually or prn     An After Visit Summary was printed and given to the patient.

## 2013-06-30 ENCOUNTER — Other Ambulatory Visit: Payer: BC Managed Care – PPO

## 2013-06-30 ENCOUNTER — Other Ambulatory Visit: Payer: Self-pay | Admitting: *Deleted

## 2013-06-30 ENCOUNTER — Ambulatory Visit
Admission: RE | Admit: 2013-06-30 | Discharge: 2013-06-30 | Disposition: A | Discharge status: Home / Self Care | Payer: BC Managed Care – PPO | Source: Ambulatory Visit | Attending: Gastroenterology | Admitting: Gastroenterology

## 2013-06-30 DIAGNOSIS — B1920 Unspecified viral hepatitis C without hepatic coma: Secondary | ICD-10-CM

## 2013-06-30 MED ORDER — DABIGATRAN ETEXILATE MESYLATE 150 MG PO CAPS: 150.0000 mg | ORAL_CAPSULE | Freq: Two times a day (BID) | ORAL | Status: DC

## 2013-07-04 ENCOUNTER — Other Ambulatory Visit: Payer: Self-pay

## 2013-07-04 ENCOUNTER — Other Ambulatory Visit: Payer: Self-pay | Admitting: Internal Medicine

## 2013-07-04 DIAGNOSIS — I4891 Unspecified atrial fibrillation: Secondary | ICD-10-CM

## 2013-07-04 MED ORDER — FLECAINIDE ACETATE 50 MG PO TABS: ORAL_TABLET | ORAL | Status: DC

## 2013-07-05 ENCOUNTER — Other Ambulatory Visit: Payer: Self-pay | Admitting: Internal Medicine

## 2013-07-27 ENCOUNTER — Encounter: Payer: Self-pay | Admitting: Internal Medicine

## 2013-08-18 ENCOUNTER — Ambulatory Visit: Payer: BC Managed Care – PPO | Admitting: Internal Medicine

## 2013-09-07 ENCOUNTER — Encounter: Payer: Self-pay | Admitting: Internal Medicine

## 2013-09-07 ENCOUNTER — Ambulatory Visit (INDEPENDENT_AMBULATORY_CARE_PROVIDER_SITE_OTHER): Payer: BC Managed Care – PPO | Admitting: Internal Medicine

## 2013-09-07 ENCOUNTER — Encounter (INDEPENDENT_AMBULATORY_CARE_PROVIDER_SITE_OTHER): Payer: Self-pay

## 2013-09-07 VITALS — BP 112/64 | HR 91 | Ht 63.0 in | Wt 102.8 lb

## 2013-09-07 DIAGNOSIS — I4891 Unspecified atrial fibrillation: Secondary | ICD-10-CM

## 2013-09-07 LAB — BASIC METABOLIC PANEL
BUN: 21 mg/dL (ref 6–23)
CO2: 31 mEq/L (ref 19–32)
Chloride: 104 mEq/L (ref 96–112)
Glucose, Bld: 105 mg/dL — ABNORMAL HIGH (ref 70–99)
Potassium: 4.9 mEq/L (ref 3.5–5.1)
Sodium: 144 mEq/L (ref 135–145)

## 2013-09-07 LAB — CBC WITH DIFFERENTIAL/PLATELET
Basophils Relative: 0.5 % (ref 0.0–3.0)
Eosinophils Absolute: 0 10*3/uL (ref 0.0–0.7)
HCT: 43.4 % (ref 36.0–46.0)
Hemoglobin: 14.5 g/dL (ref 12.0–15.0)
Lymphocytes Relative: 19 % (ref 12.0–46.0)
Lymphs Abs: 1 10*3/uL (ref 0.7–4.0)
MCHC: 33.3 g/dL (ref 30.0–36.0)
Monocytes Absolute: 0.4 10*3/uL (ref 0.1–1.0)
Monocytes Relative: 7.1 % (ref 3.0–12.0)
Neutro Abs: 3.9 10*3/uL (ref 1.4–7.7)
Platelets: 173 10*3/uL (ref 150.0–400.0)
RBC: 4.41 Mil/uL (ref 3.87–5.11)
RDW: 13.3 % (ref 11.5–14.6)

## 2013-09-07 NOTE — Assessment & Plan Note (Signed)
Persistent. On dabigitran. We'll undertake cardioversion. She has not tolerated higher doses of flecainide. She is not interested in catheter ablation. We did discuss the possibility of transitioning to dofetilide.

## 2013-09-07 NOTE — Patient Instructions (Signed)

## 2013-09-07 NOTE — Progress Notes (Signed)
      Patient Care Team: Hollice Espy, MD as PCP - General (Family Medicine)   HPI  Connie Chase is a 67 y.o. female Seen following dccv for atrial fibrillation //flutter.  She has had some difficulty tolerating intermittent dizziness point of view. This has gradually lessened.   She prefers sinus rhythm to atrial fibrillation but does not like the flecainide because of associated dizziness  At the last visit we decreased her flecainide from 50 twice a day to 25/50 Tolerating interms of dizziness  And myopathy   She went into atrial fibrillation in August and has been out of rhythm ever since.       Past Medical History  Diagnosis Date  . Atrial fibrillation     s/p DCCV in 3/11  . Other malaise and fatigue   . Lower extremity edema     Right  . Hepatitis B infection     Chronic Hep B carrier, on suppressive Tx  . Hemorrhage anterior chamber eye     On pradaxa  . Fibroid   . Gestational diabetes     x 2  . Anemia     as a child  . 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 Hayfever     allergic to    Past Surgical History  Procedure Laterality Date  . Cardioversion  04/02/2012    Procedure: CARDIOVERSION;  Surgeon: Duke Salvia, MD;  Location: [facility] OR;  Service: Cardiovascular;  Laterality: N/A;  . Cataract extraction Bilateral 03/2011 2013    03/2011 left, 2013 right    Current Outpatient Prescriptions  Medication Sig Dispense Refill  . beta carotene w/minerals (OCUVITE) tablet Take 1 tablet by mouth daily.       . dabigatran (PRADAXA) 150 MG CAPS capsule Take 1 capsule (150 mg total) by mouth 2 (two) times daily.  180 capsule  3  . diltiazem (CARDIZEM) 30 MG tablet Take 30 mg by mouth 2 (two) times daily.      . flecainide (TAMBOCOR) 50 MG tablet Take 1/2 tablet in the morning and 1 tablet at night      . Magnesium 250 MG TABS Take 1 tablet by mouth daily.        . Multiple Vitamin (MULTIVITAMIN) tablet Take 1 tablet by mouth daily.        . Omega-3 Fatty  Acids (FISH OIL) 1200 MG CAPS Take 1 capsule by mouth daily.        No current facility-administered medications for this visit.    No Known Allergies  Review of Systems negative except from HPI and PMH  Physical Exam BP 112/64  Pulse 91  Ht 5\' 3"  (1.6 m)  Wt 102 lb 12.8 oz (46.63 kg)  BMI 18.21 kg/m2 Well developed and well nourished in no acute distress HENT normal E scleral and icterus clear Neck Supple JVP flat; carotids brisk and full Clear to ausculation Iregular rate and rhythm, no murmurs gallops or rub Soft with active bowel sounds No clubbing cyanosis none Edema Alert and oriented, grossly normal motor and sensory function Skin Warm and Dry  ECG demonstrates atrial fibrillation 84 Intervals-/08/37 the axis XCIII  Assessment and  Plan

## 2013-09-08 ENCOUNTER — Encounter: Payer: Self-pay | Admitting: *Deleted

## 2013-09-08 ENCOUNTER — Telehealth: Payer: Self-pay | Admitting: *Deleted

## 2013-09-08 NOTE — Telephone Encounter (Signed)
New message ° ° ° ° ° °Returned Sherri's call °

## 2013-09-08 NOTE — Telephone Encounter (Signed)
Per patient request I discussed procedure details with her husband. DCCV for Monday 09/12/2013 at 2pm with Dr. Graciela Husbands, but made them aware to be at hospital by noon. Patient's husband verbalized understanding and agreeable to plan.

## 2013-09-08 NOTE — Telephone Encounter (Signed)
Left message to call back to discuss details of DCCV for 09/12/2013 at 2pm

## 2013-09-08 NOTE — Telephone Encounter (Signed)
Follow Up:  Pt states he is returning Sherri's call.

## 2013-09-09 ENCOUNTER — Encounter (HOSPITAL_COMMUNITY): Payer: Self-pay | Admitting: Pharmacy Technician

## 2013-09-12 ENCOUNTER — Ambulatory Visit (HOSPITAL_COMMUNITY)
Admission: RE | Admit: 2013-09-12 | Discharge: 2013-09-12 | Disposition: A | Discharge status: Home / Self Care | Payer: BC Managed Care – PPO | Source: Ambulatory Visit | Attending: Internal Medicine | Admitting: Internal Medicine

## 2013-09-12 ENCOUNTER — Encounter (HOSPITAL_COMMUNITY)
Admission: RE | Disposition: A | Discharge status: Home / Self Care | Payer: Self-pay | Source: Ambulatory Visit | Attending: Internal Medicine

## 2013-09-12 ENCOUNTER — Encounter (HOSPITAL_COMMUNITY): Payer: BC Managed Care – PPO | Admitting: Anesthesiology

## 2013-09-12 ENCOUNTER — Ambulatory Visit (HOSPITAL_COMMUNITY): Payer: BC Managed Care – PPO | Admitting: Anesthesiology

## 2013-09-12 DIAGNOSIS — I4891 Unspecified atrial fibrillation: Secondary | ICD-10-CM

## 2013-09-12 HISTORY — PX: CARDIOVERSION: SHX1299

## 2013-09-12 SURGERY — CARDIOVERSION
Anesthesia: Monitor Anesthesia Care

## 2013-09-12 MED ORDER — SODIUM CHLORIDE 0.9 % IV SOLN: INTRAVENOUS | Status: DC

## 2013-09-12 MED ORDER — PROPOFOL 10 MG/ML IV BOLUS
INTRAVENOUS | Status: DC | PRN
  Administered 2013-09-12: 60 mg via INTRAVENOUS

## 2013-09-12 MED ORDER — LIDOCAINE HCL 1 % IJ SOLN: INTRAMUSCULAR | Status: DC | PRN

## 2013-09-12 MED ORDER — LIDOCAINE HCL (CARDIAC) 20 MG/ML IV SOLN
INTRAVENOUS | Status: DC | PRN
  Administered 2013-09-12: 80 mg via INTRAVENOUS

## 2013-09-12 NOTE — Anesthesia Postprocedure Evaluation (Signed)
  Anesthesia Post-op Note  Patient: Connie Chase  Procedure(s) Performed: Procedure(s): CARDIOVERSION (N/A)  Patient Location: Endoscopy Unit  Anesthesia Type:MAC  Level of Consciousness: awake  Airway and Oxygen Therapy: Patient Spontanous Breathing and Patient connected to nasal cannula oxygen  Post-op Pain: none  Post-op Assessment: Post-op Vital signs reviewed and Patient's Cardiovascular Status Stable  Post-op Vital Signs: Reviewed and stable  Complications: No apparent anesthesia complications

## 2013-09-12 NOTE — Anesthesia Preprocedure Evaluation (Addendum)
Anesthesia Evaluation  Patient identified by MRN, date of birth, ID band Patient awake    Reviewed: Allergy & Precautions, H&P , NPO status , Patient's Chart, lab work & pertinent test results, reviewed documented beta blocker date and time   Airway Mallampati: II TM Distance: >3 FB Neck ROM: Full    Dental  (+) Teeth Intact and Dental Advisory Given   Pulmonary    Pulmonary exam normal       Cardiovascular + dysrhythmias Atrial Fibrillation Rhythm:Irregular     Neuro/Psych    GI/Hepatic   Endo/Other  diabetes  Renal/GU      Musculoskeletal   Abdominal   Peds  Hematology   Anesthesia Other Findings   Reproductive/Obstetrics                          Anesthesia Physical Anesthesia Plan  ASA: III  Anesthesia Plan: MAC   Post-op Pain Management:    Induction: Intravenous  Airway Management Planned: Mask  Additional Equipment:   Intra-op Plan:   Post-operative Plan:   Informed Consent: I have reviewed the patients History and Physical, chart, labs and discussed the procedure including the risks, benefits and alternatives for the proposed anesthesia with the patient or authorized representative who has indicated his/her understanding and acceptance.   Dental advisory given  Plan Discussed with: CRNA, Anesthesiologist and Surgeon  Anesthesia Plan Comments:         Anesthesia Quick Evaluation

## 2013-09-12 NOTE — Transfer of Care (Signed)
Immediate Anesthesia Transfer of Care Note  Patient: Connie Chase  Procedure(s) Performed: Procedure(s): CARDIOVERSION (N/A)  Patient Location: Endoscopy Unit  Anesthesia Type:MAC  Level of Consciousness: awake  Airway & Oxygen Therapy: Patient Spontanous Breathing and Patient connected to nasal cannula oxygen  Post-op Assessment: Report given to PACU RN and Post -op Vital signs reviewed and stable  Post vital signs: Reviewed and stable  Complications: No apparent anesthesia complications

## 2013-09-12 NOTE — Interval H&P Note (Signed)
History and Physical Interval Note:  09/12/2013 1:47 PM  Connie Chase  has presented today for surgery, with the diagnosis of a fib  The various methods of treatment have been discussed with the patient and family. After consideration of risks, benefits and other options for treatment, the patient has consented to  Procedure(s): CARDIOVERSION (N/A) as a surgical intervention .  [name] history has been reviewed, patient examined, no change in status, stable for surgery.  I have reviewed the patient's chart and labs.  Questions were answered to the patient's satisfaction.     Sherryl Manges Took  dabigitran this am

## 2013-09-12 NOTE — CV Procedure (Signed)
Preop Dx  AFib Post op DX  NSR   Procedure  DC Cardioversion   Pt was sedated by anesthesia receiving 60 mg Propafol   A synchronized shock 120  joules restored sinus Rhythm   Pt tolerated without difficulty

## 2013-09-12 NOTE — H&P (View-Only) (Signed)
      Patient Care Team: Donna Ruth Gates, MD as PCP - General (Family Medicine)   HPI  Connie Chase is a 67 y.o. female Seen following dccv for atrial fibrillation //flutter.  She has had some difficulty tolerating intermittent dizziness point of view. This has gradually lessened.   She prefers sinus rhythm to atrial fibrillation but does not like the flecainide because of associated dizziness  At the last visit we decreased her flecainide from 50 twice a day to 25/50 Tolerating interms of dizziness  And myopathy   She went into atrial fibrillation in August and has been out of rhythm ever since.       Past Medical History  Diagnosis Date  . Atrial fibrillation     s/p DCCV in 3/11  . Other malaise and fatigue   . Lower extremity edema     Right  . Hepatitis B infection     Chronic Hep B carrier, on suppressive Tx  . Hemorrhage anterior chamber eye     On pradaxa  . Fibroid   . Gestational diabetes     x 2  . Anemia     as a child  . 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 Hayfever     allergic to    Past Surgical History  Procedure Laterality Date  . Cardioversion  04/02/2012    Procedure: CARDIOVERSION;  Surgeon: Karem Tomaso C Taavi Hoose, MD;  Location: [facility] OR;  Service: Cardiovascular;  Laterality: N/A;  . Cataract extraction Bilateral 03/2011 2013    03/2011 left, 2013 right    Current Outpatient Prescriptions  Medication Sig Dispense Refill  . beta carotene w/minerals (OCUVITE) tablet Take 1 tablet by mouth daily.       . dabigatran (PRADAXA) 150 MG CAPS capsule Take 1 capsule (150 mg total) by mouth 2 (two) times daily.  180 capsule  3  . diltiazem (CARDIZEM) 30 MG tablet Take 30 mg by mouth 2 (two) times daily.      . flecainide (TAMBOCOR) 50 MG tablet Take 1/2 tablet in the morning and 1 tablet at night      . Magnesium 250 MG TABS Take 1 tablet by mouth daily.        . Multiple Vitamin (MULTIVITAMIN) tablet Take 1 tablet by mouth daily.        . Omega-3 Fatty  Acids (FISH OIL) 1200 MG CAPS Take 1 capsule by mouth daily.        No current facility-administered medications for this visit.    No Known Allergies  Review of Systems negative except from HPI and PMH  Physical Exam BP 112/64  Pulse 91  Ht 5' 3" (1.6 m)  Wt 102 lb 12.8 oz (46.63 kg)  BMI 18.21 kg/m2 Well developed and well nourished in no acute distress HENT normal E scleral and icterus clear Neck Supple JVP flat; carotids brisk and full Clear to ausculation Iregular rate and rhythm, no murmurs gallops or rub Soft with active bowel sounds No clubbing cyanosis none Edema Alert and oriented, grossly normal motor and sensory function Skin Warm and Dry  ECG demonstrates atrial fibrillation 84 Intervals-/08/37 the axis XCIII  Assessment and  Plan  

## 2013-09-13 ENCOUNTER — Encounter (HOSPITAL_COMMUNITY): Payer: Self-pay | Admitting: Internal Medicine

## 2013-09-13 ENCOUNTER — Ambulatory Visit (HOSPITAL_COMMUNITY): Admit: 2013-09-13 | Payer: Self-pay | Admitting: Internal Medicine

## 2013-09-13 ENCOUNTER — Encounter (HOSPITAL_COMMUNITY): Payer: Self-pay

## 2013-09-13 SURGERY — CARDIOVERSION
Anesthesia: Monitor Anesthesia Care

## 2013-09-14 ENCOUNTER — Other Ambulatory Visit: Payer: Self-pay | Admitting: Internal Medicine

## 2013-09-30 ENCOUNTER — Encounter: Payer: Self-pay | Admitting: Cardiology

## 2013-09-30 ENCOUNTER — Ambulatory Visit (INDEPENDENT_AMBULATORY_CARE_PROVIDER_SITE_OTHER): Payer: BC Managed Care – PPO | Admitting: Cardiology

## 2013-09-30 VITALS — BP 116/86 | HR 93 | Ht 63.0 in | Wt 97.4 lb

## 2013-09-30 DIAGNOSIS — I4891 Unspecified atrial fibrillation: Secondary | ICD-10-CM

## 2013-09-30 NOTE — Progress Notes (Signed)
ELECTROPHYSIOLOGY OFFICE NOTE  Patient ID: Connie Chase MRN: [id number], DOB/AGE: July 15, 1946   Date of Visit: 09/30/2013  Primary Physician: Hollice Espy, MD Primary Cardiologist: Berton Mount, MD Reason for Visit: Follow-up for atrial fibrillation  History of Present Illness  Connie Chase is a 67 y.o. female with paroxysmal AF who presents today for routine electrophysiology followup. Since last being seen in our clinic, she reports she is doing well and has no complaints. She thinks she has been in normal rhythm; however, this morning prior to her visit "because I was thinking about it" she checked her pulse and felt that it was irregular. Otherwise, she has no awareness of her AF today. In the past she has been symptomatic. Today, she denies chest pain or shortness of breath. She denies palpitations, dizziness, near syncope or syncope. She denies LE swelling, orthopnea, PND or recent weight gain. She is compliant and tolerating medications without difficulty.  Past Medical History Past Medical History  Diagnosis Date  . Atrial fibrillation     s/p DCCV in 3/11  . Other malaise and fatigue   . Lower extremity edema     Right  . Hepatitis B infection     Chronic Hep B carrier, on suppressive Tx  . Hemorrhage anterior chamber eye     On pradaxa  . Fibroid   . Gestational diabetes     x 2  . Anemia     as a child  . 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 Hayfever     allergic to    Past Surgical History Past Surgical History  Procedure Laterality Date  . Cardioversion  04/02/2012    Procedure: CARDIOVERSION;  Surgeon: Duke Salvia, MD;  Location: [facility] OR;  Service: Cardiovascular;  Laterality: N/A;  . Cataract extraction Bilateral 03/2011 2013    03/2011 left, 2013 right  . Cardioversion N/A 09/12/2013    Procedure: CARDIOVERSION;  Surgeon: Duke Salvia, MD;  Location: [facility] ENDOSCOPY;  Service: Cardiovascular;  Laterality: N/A;    Allergies/Intolerances No Known  Allergies  Current Home Medications Current Outpatient Prescriptions  Medication Sig Dispense Refill  . beta carotene w/minerals (OCUVITE) tablet Take 1 tablet by mouth daily.       . cholecalciferol (VITAMIN D) 1000 UNITS tablet Take 1,000 Units by mouth daily.      . dabigatran (PRADAXA) 150 MG CAPS capsule Take 150 mg by mouth every 12 (twelve) hours.      Marland Kitchen diltiazem (CARDIZEM) 30 MG tablet TAKE 1 TABLET BY MOUTH 2 TIMES A DAY      . flecainide (TAMBOCOR) 50 MG tablet Take 25-50 mg by mouth 2 (two) times daily. Takes 25mg  in the morning and 50mg  in the evening      . Magnesium 250 MG TABS Take 250 mg by mouth daily.       . Multiple Vitamins-Minerals (CENTRUM SILVER PO) Take by mouth.      . Omega-3 Fatty Acids (FISH OIL) 1200 MG CAPS Take 1,200 mg by mouth daily.        No current facility-administered medications for this visit.    Social History Social History  . Marital Status: Married   Social History Main Topics  . Smoking status: Never Smoker   . Smokeless tobacco: Never Used  . Alcohol Use: No  . Drug Use: No   Review of Systems General: No chills, fever, night sweats or weight changes Cardiovascular: No chest pain, dyspnea on exertion, edema, orthopnea, palpitations, paroxysmal  nocturnal dyspnea Dermatological: No rash, lesions or masses Respiratory: No cough, dyspnea Urologic: No hematuria, dysuria Abdominal: No nausea, vomiting, diarrhea, bright red blood per rectum, melena, or hematemesis Neurologic: No visual changes, weakness, changes in mental status All other systems reviewed and are otherwise negative except as noted above.  Physical Exam Vitals: Blood pressure 116/86, pulse 93, height 5\' 3"  (1.6 m), weight 97 lb 6.4 oz (44.18 kg).  General: Well developed, well appearing 67 y.o. female in no acute distress. HEENT: Normocephalic, atraumatic. EOMs intact. Sclera nonicteric. Oropharynx clear.  Neck: Supple without bruits. No JVD. Lungs: Respirations  regular and unlabored, CTA bilaterally. No wheezes, rales or rhonchi. Heart: Irregular. S1, S2 present. No murmurs, rub, S3 or S4. Abdomen: Soft, non-distended.  Extremities: No clubbing, cyanosis or edema. PT/Radials 2+ and equal bilaterally. Psych: Normal affect. Neuro: Alert and oriented X 3. Moves all extremities spontaneously.   Diagnostics 12-lead ECG today - atrial fibrillation at 93 bpm; QTc 445 msec  Assessment and Plan 1. Recurrent AF - asymptomatic currently - rate controlled - therapeutic strategies for AF including medicine and ablation were reviewed today; rate control versus rhythm control discussed; she has not tolerated higher doses of flecainide in the past due to dizziness; also in the past Dr. Graciela Husbands has mentioned AAD therapy with Tikosyn and possible AF ablation; I reviewed both of these options again in detail today  - at this time Ms. Jarosz states, "I feel better than before cardioversion" and she has expressed her preference for conservative management; she is not interested in hospital admission for Tikosyn or pursuing AF ablation at this time - she will consider her options further and return in 4 weeks to see Dr. Graciela Husbands  - she will continue flecainide for now until she sees Dr. Graciela Husbands in follow-up and if at that time a rate control approach is what she elects this can be stopped - she will continue diltiazem for rate control and Pradaxa for stroke prevention  This plan of care was formulated with Dr. Graciela Husbands via phone. Signed, Rick Duff, PA-C 09/30/2013, 11:47 AM

## 2013-09-30 NOTE — Patient Instructions (Signed)
Your physician recommends that you schedule a follow-up appointment in: WITH DR. Graciela Husbands IN 4-6 WEEKS FOR A. FIB (PER DR. Graciela Husbands)  Your physician recommends that you continue on your current medications as directed. Please refer to the Current Medication list given to you today.

## 2013-11-16 ENCOUNTER — Encounter: Payer: Self-pay | Admitting: Internal Medicine

## 2013-11-16 ENCOUNTER — Ambulatory Visit (INDEPENDENT_AMBULATORY_CARE_PROVIDER_SITE_OTHER): Payer: BC Managed Care – PPO | Admitting: Internal Medicine

## 2013-11-16 VITALS — BP 113/66 | HR 85 | Ht 63.0 in | Wt 103.0 lb

## 2013-11-16 DIAGNOSIS — I4891 Unspecified atrial fibrillation: Secondary | ICD-10-CM

## 2013-11-16 LAB — SEDIMENTATION RATE: Sed Rate: 18 mm/hr (ref 0–22)

## 2013-11-16 MED ORDER — VERAPAMIL HCL ER 120 MG PO TBCR: 120.0000 mg | EXTENDED_RELEASE_TABLET | Freq: Every day | ORAL | Status: DC

## 2013-11-16 NOTE — Patient Instructions (Addendum)
.  Your physician recommends that you have lab work today: ESR  Your physician has recommended you make the following change in your medication:  1) Stop Flecanide, (then 2 weeks after --- stop Diltiazem) 2) Stop Diltiazem --- (stop 2 weeks after stopping Flecanide) 3) Start Verapamil 120 mg daily at bedtime  Your physician recommends that you schedule a follow-up appointment in: 4-6 weeks with Dr. Caryl Comes

## 2013-11-16 NOTE — Progress Notes (Signed)
Patient Care Team: Marjorie Smolder, MD as PCP - General (Family Medicine)   HPI  Connie Chase is a 68 y.o. female Seen following dccv for atrial fibrillation //flutter.  She has had some difficulty tolerating intermittent dizziness   Dizziness and this is been attributed both to atrial fibrillation as well as flecainide. When she was last seen following cardioversion 11/14 she was unaware of her atrial fibrillation and "felt better than before cardioversion" and so as elected to not pursue alternative strategy of rate control.  She was continued on  Flecainide.  She went into atrial fibrillation in August and underwent cardioversion in October which failed to hold. She saw PE in November at which point she felt that she was feeling quite good it was elected to pursue rate control although she was left on flecainide at that time.  She comes in today with complaints of constipation. We'll do her calcium blocker, leg weakness fatigue and dizziness.       Past Medical History  Diagnosis Date  . Atrial fibrillation     s/p DCCV in 3/11  . Other malaise and fatigue   . Lower extremity edema     Right  . Hepatitis B infection     Chronic Hep B carrier, on suppressive Tx  . Hemorrhage anterior chamber eye     On pradaxa  . Fibroid   . Gestational diabetes     x 2  . Anemia     as a child  . 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 Hayfever     allergic to    Past Surgical History  Procedure Laterality Date  . Cardioversion  04/02/2012    Procedure: CARDIOVERSION;  Surgeon: Deboraha Sprang, MD;  Location: [facility] OR;  Service: Cardiovascular;  Laterality: N/A;  . Cataract extraction Bilateral 03/2011 2013    03/2011 left, 2013 right  . Cardioversion N/A 09/12/2013    Procedure: CARDIOVERSION;  Surgeon: Deboraha Sprang, MD;  Location: [facility] ENDOSCOPY;  Service: Cardiovascular;  Laterality: N/A;    Current Outpatient Prescriptions  Medication Sig Dispense Refill  . beta carotene  w/minerals (OCUVITE) tablet Take 1 tablet by mouth daily.       . cholecalciferol (VITAMIN D) 1000 UNITS tablet Take 1,000 Units by mouth daily.      . dabigatran (PRADAXA) 150 MG CAPS capsule Take 150 mg by mouth every 12 (twelve) hours.      Marland Kitchen diltiazem (CARDIZEM) 30 MG tablet TAKE 1 TABLET BY MOUTH 2 TIMES A DAY      . flecainide (TAMBOCOR) 50 MG tablet Take 25-50 mg by mouth 2 (two) times daily. Takes 25mg  in the morning and 50mg  in the evening      . Magnesium 250 MG TABS Take 250 mg by mouth daily.       . Multiple Vitamins-Minerals (CENTRUM SILVER PO) Take by mouth.      . Omega-3 Fatty Acids (FISH OIL) 1200 MG CAPS Take 1,200 mg by mouth daily.        No current facility-administered medications for this visit.    No Known Allergies  Review of Systems negative except from HPI and PMH  Physical Exam BP 113/66  Pulse 85  Ht 5\' 3"  (1.6 m)  Wt 103 lb (46.72 kg)  BMI 18.25 kg/m2 Well developed and well nourished in no acute distress HENT normal E scleral and icterus clear Neck Supple JVP flat; carotids brisk and full Clear to  ausculation Iregular rate and rhythm, no murmurs gallops or rub Soft with active bowel sounds No clubbing cyanosis none Edema Alert and oriented, grossly normal motor and sensory function Skin Warm and Dry  ECG demonstrates atrial fibrillation 85 Intervals-/08/37 the axis 47  Assessment and  Plan  Atrial fibrillation she is now persistent atrial fibrillation. It is my impression atrial fibrillation is responsible for much of her symptoms. It is her impression that is a flecainide. As she is in atrial fibrillation not withstanding a flecainide we will discontinue it. We'll plan in also to stop her diltiazem and use verapamil as an alternative to see if we can identify whether diltiazem further contributing to her symptoms.  We had a lengthy discussion regarding antiarrhythmic alternatives, i.e. dofetilide and or amiodarone.  We also discussed catheter  ablation. She is low dispense rate is in the hospital and she is very concerned about side effects from amiodarone or complications from the procedure.  We'll plan in about 4-6 weeks to regroup to see how she is doing with her atrial fibrillation. We also discussed the implications of atrial fibrillation being left on converted. There are recent data suggesting that outcomes were worse in patients with persistent versus paroxysmal atrial fibrillation, this data was escorted from the prior information from the Affirm Trial tjhis discussion took 30 min  Dizziness As above   Leg weakness  As above we'll also check a sedimentation rate

## 2013-11-21 ENCOUNTER — Ambulatory Visit: Payer: BC Managed Care – PPO | Admitting: Internal Medicine

## 2013-11-21 ENCOUNTER — Telehealth: Payer: Self-pay | Admitting: Internal Medicine

## 2013-11-21 NOTE — Telephone Encounter (Signed)
New message ° ° ° ° ° °Returning a nurses call from last week °

## 2013-11-22 NOTE — Telephone Encounter (Signed)
A user error has taken place: encounter opened in error, closed for administrative reasons.  See lab results

## 2013-12-20 ENCOUNTER — Other Ambulatory Visit: Payer: Self-pay | Admitting: Gastroenterology

## 2013-12-20 DIAGNOSIS — B1910 Unspecified viral hepatitis B without hepatic coma: Secondary | ICD-10-CM

## 2013-12-22 ENCOUNTER — Ambulatory Visit
Admission: RE | Admit: 2013-12-22 | Discharge: 2013-12-22 | Disposition: A | Discharge status: Home / Self Care | Payer: BC Managed Care – PPO | Source: Ambulatory Visit | Attending: Gastroenterology | Admitting: Gastroenterology

## 2013-12-22 DIAGNOSIS — B1910 Unspecified viral hepatitis B without hepatic coma: Secondary | ICD-10-CM

## 2013-12-29 ENCOUNTER — Ambulatory Visit (INDEPENDENT_AMBULATORY_CARE_PROVIDER_SITE_OTHER): Payer: BC Managed Care – PPO | Admitting: Internal Medicine

## 2013-12-29 ENCOUNTER — Encounter: Payer: Self-pay | Admitting: Internal Medicine

## 2013-12-29 VITALS — BP 114/78 | HR 86 | Ht 63.0 in | Wt 103.0 lb

## 2013-12-29 DIAGNOSIS — I4891 Unspecified atrial fibrillation: Secondary | ICD-10-CM

## 2013-12-29 DIAGNOSIS — Z01812 Encounter for preprocedural laboratory examination: Secondary | ICD-10-CM

## 2013-12-29 NOTE — Progress Notes (Signed)
kf      Patient Care Team: Marjorie Smolder, MD as PCP - General (Family Medicine)   HPI  Connie Chase is a 68 y.o. female Seen following dccv for atrial fibrillation //flutter.  She has had some difficulty with intermittent dizziness  attributed both to atrial fibrillation as well as flecainide.   When she was last seen following cardioversion 11/14 she was unaware of her atrial fibrillation and "felt better than before cardioversion" and so as elected to   pursue alternative strategy of rate control.    At her last visit there were number of issues including ongoing dizziness which I thought was likely related to atrial fibrillation, constipation and weakness which we thought might be related to her calcium channel blocker. We discussed alternative antiarrhythmic therapy including drugs and/or catheter ablation but at that time she was loathe to pursue alternatives   She is much better off the flecainide. The dizziness is largely resolved. It is her impression that the verapamil is better and diltiazem which he still has some swelling in her ankles which she ascribes to his medications as well as some weakness when she is carrying and bending over. Past Medical History  Diagnosis Date  . Atrial fibrillation     s/p DCCV in 3/11  . Other malaise and fatigue   . Lower extremity edema     Right  . Hepatitis B infection     Chronic Hep B carrier, on suppressive Tx  . Hemorrhage anterior chamber eye     On pradaxa  . Fibroid   . Gestational diabetes     x 2  . Anemia     as a child  . 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 Hayfever     allergic to    Past Surgical History  Procedure Laterality Date  . Cardioversion  04/02/2012    Procedure: CARDIOVERSION;  Surgeon: Deboraha Sprang, MD;  Location: [facility] OR;  Service: Cardiovascular;  Laterality: N/A;  . Cataract extraction Bilateral 03/2011 2013    03/2011 left, 2013 right  . Cardioversion N/A 09/12/2013    Procedure:  CARDIOVERSION;  Surgeon: Deboraha Sprang, MD;  Location: [facility] ENDOSCOPY;  Service: Cardiovascular;  Laterality: N/A;    Current Outpatient Prescriptions  Medication Sig Dispense Refill  . beta carotene w/minerals (OCUVITE) tablet Take 1 tablet by mouth daily.       . cholecalciferol (VITAMIN D) 1000 UNITS tablet Take 1,000 Units by mouth daily.      . dabigatran (PRADAXA) 150 MG CAPS capsule Take 150 mg by mouth every 12 (twelve) hours.      . Magnesium 250 MG TABS Take 250 mg by mouth daily.       . Multiple Vitamins-Minerals (CENTRUM SILVER PO) Take by mouth.      . Omega-3 Fatty Acids (FISH OIL) 1200 MG CAPS Take 1,200 mg by mouth daily.       . verapamil (CALAN-SR) 120 MG CR tablet Take 1 tablet (120 mg total) by mouth at bedtime.  30 tablet  2   No current facility-administered medications for this visit.    No Known Allergies  Review of Systems negative except from HPI and PMH  Physical Exam BP 114/78  Pulse 86  Ht 5\' 3"  (1.6 m)  Wt 103 lb (46.72 kg)  BMI 18.25 kg/m2 Well developed and well nourished in no acute distress HENT normal E scleral and icterus clear Neck Supple JVP flat; carotids brisk and full Clear  to ausculation Irregularly irregular rate and rhythm, no murmurs gallops or rub Soft with active bowel sounds No clubbing cyanosis tr Edema Alert and oriented, grossly normal motor and sensory function Skin Warm and Dry  ECG demonstrates atrial fibrillation at 86 Intervals-/08/38  Assessment and  Plan  Atrial Fibrillation  The patient has now persistent atrial fibrillation. We spent about 45 minutes discussing options including dofetilide with risks of proarrhythmia, catheter ablation with issues of recurrence and complications.  If she pursues catheter ablation she would like to discuss with Dr. Adrian Prows at [facility]   She would like to undergo unmedicated cardioversion at her next step. I have told her that there is no reason not to do that but I  am not at all sanguine that would be of benefit for her. Notwithstanding she would like to pursue that.  We also discussed the value of maintaining a sinus rhythm in terms of long-term benefits associated with catheter ablation although we are aware that there are no data that the maintaining of sinus rhythm is associated with improved outcome.

## 2013-12-29 NOTE — Patient Instructions (Addendum)
Your physician has recommended that you have a Cardioversion (DCCV). Electrical Cardioversion uses a jolt of electricity to your heart either through paddles or wired patches attached to your chest. This is a controlled, usually prescheduled, procedure. Defibrillation is done under light anesthesia in the hospital, and you usually go home the day of the procedure. This is done to get your heart back into a normal rhythm. You are not awake for the procedure. Please see the instruction sheet given to you today.   Connie Curet, RN will call you to schedule cardioversion for 2nd/3rd week of March.  [phone]

## 2013-12-30 ENCOUNTER — Telehealth: Payer: Self-pay | Admitting: *Deleted

## 2013-12-30 NOTE — Telephone Encounter (Signed)
Left message with pt's husband (per her request) to schedule DCCV early a.m. 2nd/3rd week of March.

## 2014-01-02 NOTE — Telephone Encounter (Signed)
New message     Need to schedule procedure

## 2014-01-03 NOTE — Telephone Encounter (Signed)
Follow UP  Pt is calling to schedule a hosp procedure

## 2014-01-03 NOTE — Telephone Encounter (Signed)
Left message for pt's husband explaining office closed due to inclement weather. I informed him I would try to call back again tomorrow.

## 2014-01-10 ENCOUNTER — Telehealth: Payer: Self-pay | Admitting: Internal Medicine

## 2014-01-10 NOTE — Telephone Encounter (Signed)
Follow up   Pt's hus returned your call please give him a call back at his work till 5 pm, then cell number please.

## 2014-01-10 NOTE — Telephone Encounter (Signed)
A user error has taken place: encounter opened in error, closed for administrative reasons.  See 2/10 telephone note

## 2014-01-10 NOTE — Telephone Encounter (Signed)
Pt's husband called back to schedule DCCV. We agreed to finalize instructions tomorrow, but 3rd week in March is procedure decision date.

## 2014-01-11 ENCOUNTER — Encounter: Payer: Self-pay | Admitting: *Deleted

## 2014-01-11 NOTE — Telephone Encounter (Signed)
Informed pt's husband of DCCV for 3/17 at [place], be at hospital at [place] instructions reviewed with him. He was unsure when she would want to come in for pre procedure labs and asked me to call her to make that appt. Patient's husband verbalized understanding of instructions.

## 2014-01-11 NOTE — Telephone Encounter (Signed)
Left message to schedule pre-procedure labs for next week.

## 2014-01-11 NOTE — Telephone Encounter (Signed)
Pre procedure labs scheduled for 3/12. Letter of instructions left at front desk for her to pick up that day. Patient verbalized understanding and agreeable to plan.

## 2014-01-12 ENCOUNTER — Telehealth: Payer: Self-pay | Admitting: *Deleted

## 2014-01-12 NOTE — Telephone Encounter (Signed)
Pt states that the  dates for labs  On 3/12 and and cardioversion on the 3/19 are find for her. Pt is aware that  it is the same time and same instructions as previous scheduled.

## 2014-01-12 NOTE — Telephone Encounter (Signed)
Follow up    Pt returned your call please call her back.

## 2014-01-12 NOTE — Telephone Encounter (Signed)
Left message on home phone also, asking someone to call back to confirm they understand new date for DCCV

## 2014-01-12 NOTE — Telephone Encounter (Signed)
New Message  Pt states that the lab for 03/13 is ok and the cardioversion for  03/19 is ok as well. Please call back to confirm scheduled appts

## 2014-01-12 NOTE — Telephone Encounter (Signed)
Left message on husband's cell phone to inform him of procedure rescheduled to 3/19, same time and instructions at previous scheduled. Asked him to call me back to confirm understanding of new procedure date.

## 2014-01-17 ENCOUNTER — Other Ambulatory Visit: Payer: Self-pay | Admitting: Internal Medicine

## 2014-01-17 DIAGNOSIS — I4891 Unspecified atrial fibrillation: Secondary | ICD-10-CM

## 2014-01-18 ENCOUNTER — Other Ambulatory Visit: Payer: Self-pay

## 2014-01-18 MED ORDER — VERAPAMIL HCL ER 120 MG PO TBCR: 120.0000 mg | EXTENDED_RELEASE_TABLET | Freq: Every day | ORAL | Status: DC

## 2014-01-19 ENCOUNTER — Other Ambulatory Visit (INDEPENDENT_AMBULATORY_CARE_PROVIDER_SITE_OTHER): Payer: BC Managed Care – PPO

## 2014-01-19 DIAGNOSIS — I4891 Unspecified atrial fibrillation: Secondary | ICD-10-CM

## 2014-01-19 DIAGNOSIS — Z01812 Encounter for preprocedural laboratory examination: Secondary | ICD-10-CM

## 2014-01-19 LAB — CBC WITH DIFFERENTIAL/PLATELET
BASOS PCT: 0.5 % (ref 0.0–3.0)
Basophils Absolute: 0 10*3/uL (ref 0.0–0.1)
Eosinophils Absolute: 0 10*3/uL (ref 0.0–0.7)
Eosinophils Relative: 0.7 % (ref 0.0–5.0)
HEMATOCRIT: 42.8 % (ref 36.0–46.0)
Hemoglobin: 14.5 g/dL (ref 12.0–15.0)
LYMPHS ABS: 0.8 10*3/uL (ref 0.7–4.0)
Lymphocytes Relative: 17.3 % (ref 12.0–46.0)
MCHC: 33.8 g/dL (ref 30.0–36.0)
MCV: 100.1 fl — AB (ref 78.0–100.0)
Monocytes Absolute: 0.4 10*3/uL (ref 0.1–1.0)
Monocytes Relative: 9.5 % (ref 3.0–12.0)
Neutro Abs: 3.2 10*3/uL (ref 1.4–7.7)
Neutrophils Relative %: 72 % (ref 43.0–77.0)
PLATELETS: 174 10*3/uL (ref 150.0–400.0)
RBC: 4.28 Mil/uL (ref 3.87–5.11)
RDW: 13.4 % (ref 11.5–14.6)
WBC: 4.5 10*3/uL (ref 4.5–10.5)

## 2014-01-19 LAB — BASIC METABOLIC PANEL
BUN: 20 mg/dL (ref 6–23)
CO2: 31 mEq/L (ref 19–32)
Calcium: 9.4 mg/dL (ref 8.4–10.5)
Chloride: 105 mEq/L (ref 96–112)
Creatinine, Ser: 0.6 mg/dL (ref 0.4–1.2)
GFR: 101.6 mL/min (ref >60.00)
Glucose, Bld: 103 mg/dL — ABNORMAL HIGH (ref 70–99)
POTASSIUM: 5 meq/L (ref 3.5–5.1)
SODIUM: 142 meq/L (ref 135–145)

## 2014-01-20 ENCOUNTER — Telehealth: Payer: Self-pay | Admitting: *Deleted

## 2014-01-20 NOTE — Telephone Encounter (Signed)
lmtcb to review upcoming DCCV instructions again, per pt request.

## 2014-01-24 ENCOUNTER — Other Ambulatory Visit: Payer: Self-pay

## 2014-01-24 MED ORDER — VERAPAMIL HCL ER 120 MG PO TBCR: 120.0000 mg | EXTENDED_RELEASE_TABLET | Freq: Every day | ORAL | Status: DC

## 2014-01-25 ENCOUNTER — Encounter (HOSPITAL_COMMUNITY): Payer: Self-pay | Admitting: Pharmacy Technician

## 2014-01-26 ENCOUNTER — Encounter (HOSPITAL_COMMUNITY): Payer: BC Managed Care – PPO | Admitting: Certified Registered"

## 2014-01-26 ENCOUNTER — Ambulatory Visit (HOSPITAL_COMMUNITY)
Admission: RE | Admit: 2014-01-26 | Discharge: 2014-01-26 | Disposition: A | Discharge status: Home / Self Care | Payer: BC Managed Care – PPO | Source: Ambulatory Visit | Attending: Cardiovascular Disease | Admitting: Cardiovascular Disease

## 2014-01-26 ENCOUNTER — Encounter (HOSPITAL_COMMUNITY): Payer: Self-pay | Admitting: *Deleted

## 2014-01-26 ENCOUNTER — Ambulatory Visit (HOSPITAL_COMMUNITY): Payer: BC Managed Care – PPO | Admitting: Certified Registered"

## 2014-01-26 ENCOUNTER — Encounter (HOSPITAL_COMMUNITY)
Admission: RE | Disposition: A | Discharge status: Home / Self Care | Payer: Self-pay | Source: Ambulatory Visit | Attending: Cardiovascular Disease

## 2014-01-26 DIAGNOSIS — B1910 Unspecified viral hepatitis B without hepatic coma: Secondary | ICD-10-CM | POA: Insufficient documentation

## 2014-01-26 DIAGNOSIS — I4891 Unspecified atrial fibrillation: Secondary | ICD-10-CM

## 2014-01-26 HISTORY — PX: CARDIOVERSION: SHX1299

## 2014-01-26 SURGERY — CARDIOVERSION
Anesthesia: General

## 2014-01-26 MED ORDER — SODIUM CHLORIDE 0.9 % IV SOLN
INTRAVENOUS | Status: DC
  Administered 2014-01-26: 07:00:00 via INTRAVENOUS

## 2014-01-26 MED ORDER — LIDOCAINE HCL (CARDIAC) 20 MG/ML IV SOLN
INTRAVENOUS | Status: DC | PRN
  Administered 2014-01-26: 80 mg via INTRAVENOUS

## 2014-01-26 MED ORDER — PROPOFOL 10 MG/ML IV BOLUS
INTRAVENOUS | Status: DC | PRN
  Administered 2014-01-26: 70 mg via INTRAVENOUS

## 2014-01-26 NOTE — Preoperative (Signed)
Beta Blockers   Reason not to administer Beta Blockers:Not Applicable 

## 2014-01-26 NOTE — CV Procedure (Signed)
Preop Dx afib Post op DX  NSR   Procedure  DC Cardioversion   Pt was sedated by anesthesia receiving 70 mg Propafol  A synchronized shock 150 joules restored sinus Rhythm   Pt tolerated without difficulty

## 2014-01-26 NOTE — Anesthesia Preprocedure Evaluation (Addendum)
Anesthesia Evaluation  Patient identified by MRN, date of birth, ID band Patient awake    Reviewed: Allergy & Precautions, H&P , NPO status , Patient's Chart, lab work & pertinent test results, reviewed documented beta blocker date and time   Airway Mallampati: I TM Distance: >3 FB     Dental  (+) Dental Advisory Given, Teeth Intact   Pulmonary neg pulmonary ROS,          Cardiovascular Exercise Tolerance: Good + dysrhythmias Atrial Fibrillation Rhythm:Irregular     Neuro/Psych negative neurological ROS  negative psych ROS   GI/Hepatic negative GI ROS, (+) Hepatitis -, B  Endo/Other  negative endocrine ROS  Renal/GU   negative genitourinary   Musculoskeletal   Abdominal (+)  Abdomen: soft. Bowel sounds: normal.  Peds  Hematology  (+) anemia ,   Anesthesia Other Findings   Reproductive/Obstetrics                       Anesthesia Physical Anesthesia Plan  ASA: III  Anesthesia Plan: General   Post-op Pain Management:    Induction: Intravenous  Airway Management Planned: Mask  Additional Equipment:   Intra-op Plan:   Post-operative Plan:   Informed Consent: I have reviewed the patients History and Physical, chart, labs and discussed the procedure including the risks, benefits and alternatives for the proposed anesthesia with the patient or authorized representative who has indicated his/her understanding and acceptance.     Plan Discussed with:   Anesthesia Plan Comments:         Anesthesia Quick Evaluation

## 2014-01-26 NOTE — Discharge Instructions (Signed)
Electrical Cardioversion, Care After Refer to this sheet in the next few weeks. These instructions provide you with information on caring for yourself after your procedure. Your health care provider may also give you more specific instructions. Your treatment has been planned according to current medical practices, but problems sometimes occur. Call your health care provider if you have any problems or questions after your procedure. WHAT TO EXPECT AFTER THE PROCEDURE After your procedure, it is typical to have the following sensations:  Some redness on the skin where the shocks were delivered. If this is tender, a sunburn lotion or hydrocortisone cream may help.  Possible return of an abnormal heart rhythm within hours or days after the procedure. HOME CARE INSTRUCTIONS  Only take medicine as directed by your health care provider. Be sure you understand how and when to take your medicine.  Learn how to feel your pulse and check it often.  Limit your activity for 48 hours after the procedure or as directed.  Avoid or minimize caffeine and other stimulants as directed. SEEK MEDICAL CARE IF:  You feel like your heart is beating too fast or your pulse is not regular.  You have any questions about your medicines.  You have bleeding that will not stop. SEEK IMMEDIATE MEDICAL CARE IF:  You are dizzy or feel faint.  It is hard to breathe or you feel short of breath.  There is a change in discomfort in your chest.  Your speech is slurred or you have trouble moving an arm or leg on one side of your body.  You get a serious muscle cramp that does not go away.  Your fingers or toes turn cold or blue. MAKE SURE YOU:   Understand these instructions.   Will watch your condition.   Will get help right away if you are not doing well or get worse. Document Released: 08/17/2013 Document Reviewed: 05/11/2013 [facility] Patient Information 2014 [place], [place].

## 2014-01-26 NOTE — Addendum Note (Signed)
Addendum created 01/26/14 0932 by Maeola Harman, CRNA   Modules edited: Charges VN

## 2014-01-26 NOTE — Anesthesia Postprocedure Evaluation (Signed)
  Anesthesia Post-op Note  Patient: Connie Chase  Procedure(s) Performed: Procedure(s): CARDIOVERSION (N/A)  Patient Location: Endoscopy Unit  Anesthesia Type:General  Level of Consciousness: awake, alert  and oriented  Airway and Oxygen Therapy: Patient connected to nasal cannula oxygen  Post-op Pain: none  Post-op Assessment: Post-op Vital signs reviewed  Post-op Vital Signs: stable  Complications: No apparent anesthesia complications

## 2014-01-26 NOTE — Transfer of Care (Signed)
Immediate Anesthesia Transfer of Care Note  Patient: Connie Chase  Procedure(s) Performed: Procedure(s): CARDIOVERSION (N/A)  Patient Location: Endoscopy Unit  Anesthesia Type:General  Level of Consciousness: awake, alert  and oriented  Airway & Oxygen Therapy: Patient Spontanous Breathing  Post-op Assessment: Report given to PACU RN  Post vital signs: stable  Complications: No apparent anesthesia complications

## 2014-01-27 ENCOUNTER — Encounter (HOSPITAL_COMMUNITY): Payer: Self-pay | Admitting: Internal Medicine

## 2014-02-08 ENCOUNTER — Encounter: Payer: Self-pay | Admitting: Internal Medicine

## 2014-02-08 ENCOUNTER — Ambulatory Visit (INDEPENDENT_AMBULATORY_CARE_PROVIDER_SITE_OTHER): Payer: BC Managed Care – PPO | Admitting: Internal Medicine

## 2014-02-08 VITALS — BP 121/72 | HR 87 | Ht 63.5 in | Wt 101.0 lb

## 2014-02-08 DIAGNOSIS — I4891 Unspecified atrial fibrillation: Secondary | ICD-10-CM

## 2014-02-08 NOTE — Progress Notes (Signed)
Patient Care Team: Marjorie Smolder, MD as PCP - General (Family Medicine)   HPI  Connie Chase is a 68 y.o. female Seen in followup for recent cardioversion that was done without antiarrhythmic support.  She is not well tolerated flecainide because of dizziness. When seen in the fall, she was unaware for atrial fibrillation and we decided transiently to pursue rate control.She enjoyed her atrial fibrillation hiatus it lasted just a few days.  Past Medical History  Diagnosis Date  . Atrial fibrillation     s/p DCCV in 3/11  . Other malaise and fatigue   . Lower extremity edema     Right  . Hepatitis B infection     Chronic Hep B carrier, on suppressive Tx  . Hemorrhage anterior chamber eye     On pradaxa  . Fibroid   . Anemia     as a child  . 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 Hayfever     allergic to    Past Surgical History  Procedure Laterality Date  . Cardioversion  04/02/2012    Procedure: CARDIOVERSION;  Surgeon: Deboraha Sprang, MD;  Location: [facility] OR;  Service: Cardiovascular;  Laterality: N/A;  . Cataract extraction Bilateral 03/2011 2013    03/2011 left, 2013 right  . Cardioversion N/A 09/12/2013    Procedure: CARDIOVERSION;  Surgeon: Deboraha Sprang, MD;  Location: [place];  Service: Cardiovascular;  Laterality: N/A;  . Cardioversion N/A 01/26/2014    Procedure: CARDIOVERSION;  Surgeon: Deboraha Sprang, MD;  Location: [facility] ENDOSCOPY;  Service: Cardiovascular;  Laterality: N/A;    Current Outpatient Prescriptions  Medication Sig Dispense Refill  . beta carotene w/minerals (OCUVITE) tablet Take 1 tablet by mouth daily.       . cholecalciferol (VITAMIN D) 1000 UNITS tablet Take 1,000 Units by mouth daily.      . dabigatran (PRADAXA) 150 MG CAPS capsule Take 150 mg by mouth every 12 (twelve) hours.      . Magnesium 250 MG TABS Take 250 mg by mouth daily.       . Multiple Vitamins-Minerals (CENTRUM SILVER PO) Take 1 tablet by mouth daily.       . verapamil  (CALAN-SR) 120 MG CR tablet Take 1 tablet (120 mg total) by mouth at bedtime.  90 tablet  0  . vitamin C (ASCORBIC ACID) 500 MG tablet Take 500 mg by mouth daily.       No current facility-administered medications for this visit.    No Known Allergies  Review of Systems negative except from HPI and PMH  Physical Exam BP 121/72  Pulse 87  Ht 5' 3.5" (1.613 m)  Wt 101 lb (45.813 kg)  BMI 17.61 kg/m2 Well developed and well nourished in no acute distress HENT normal E scleral and icterus clear Neck Supple JVP Clear to ausculation irregularly irregular   no murmurs gallops or rub Soft with active bowel sounds No clubbing cyanosis  Edema Alert and oriented, grossly normal motor and sensory function Skin Warm and Dry  ECG demonstrates atrial fibrillation at 87 intervals-/70/34. Assessment and  Plan  Atrial fibrillation  The patient has recurring atrial fibrillation. We have discussed options of antiarrhythmic therapy and catheter ablation again at length. We discussed pro arrhythmia as well as the need for hospitalization for the lesion of dofetilide.   At this juncture she would like to continue on medical therapy with rate control. She would like to regroup in about  6 months.

## 2014-02-08 NOTE — Patient Instructions (Signed)
Your physician recommends that you continue on your current medications as directed. Please refer to the Current Medication list given to you today.  Your physician wants you to follow-up in: 6 months with Dr. Klein. You will receive a reminder letter in the mail two months in advance. If you don't receive a letter, please call our office to schedule the follow-up appointment.  

## 2014-04-06 ENCOUNTER — Other Ambulatory Visit: Payer: Self-pay | Admitting: Internal Medicine

## 2014-04-10 ENCOUNTER — Other Ambulatory Visit: Payer: Self-pay | Admitting: Internal Medicine

## 2014-05-31 ENCOUNTER — Other Ambulatory Visit: Payer: Self-pay | Admitting: Pediatrics

## 2014-05-31 ENCOUNTER — Other Ambulatory Visit: Payer: Self-pay | Admitting: Gastroenterology

## 2014-05-31 DIAGNOSIS — B181 Chronic viral hepatitis B without delta-agent: Secondary | ICD-10-CM

## 2014-06-15 ENCOUNTER — Other Ambulatory Visit: Payer: BC Managed Care – PPO

## 2014-06-15 ENCOUNTER — Ambulatory Visit
Admission: RE | Admit: 2014-06-15 | Discharge: 2014-06-15 | Disposition: A | Discharge status: Home / Self Care | Payer: BC Managed Care – PPO | Source: Ambulatory Visit | Attending: Gastroenterology | Admitting: Gastroenterology

## 2014-06-15 DIAGNOSIS — B181 Chronic viral hepatitis B without delta-agent: Secondary | ICD-10-CM

## 2014-06-28 ENCOUNTER — Telehealth: Payer: Self-pay | Admitting: Obstetrics and Gynecology

## 2014-06-28 NOTE — Telephone Encounter (Signed)
Confirming pts appt °

## 2014-07-05 ENCOUNTER — Ambulatory Visit (INDEPENDENT_AMBULATORY_CARE_PROVIDER_SITE_OTHER): Payer: BC Managed Care – PPO | Admitting: Obstetrics and Gynecology

## 2014-07-05 ENCOUNTER — Encounter: Payer: Self-pay | Admitting: Obstetrics and Gynecology

## 2014-07-05 VITALS — BP 110/76 | HR 80 | Resp 20 | Ht 62.5 in | Wt 99.6 lb

## 2014-07-05 DIAGNOSIS — Z01419 Encounter for gynecological examination (general) (routine) without abnormal findings: Secondary | ICD-10-CM

## 2014-07-05 NOTE — Patient Instructions (Signed)

## 2014-07-05 NOTE — Progress Notes (Signed)
Patient ID: Connie Chase, female   DOB: Apr 06, 1946, 68 y.o.   MRN: [id number] GYNECOLOGY VISIT  PCP:  Darcus Austin, MD Cardiologist:  Virl Axe, MD Referring provider:   HPI: 68 y.o.   Married  Asian  female   G41P2 with Patient's last menstrual period was 11/10/2006.   here for   AEX.  Patient has atrial fibrillation.  Taking medication for rate control at bedtime.  Having some dizziness.   Takes Vit D. And eats cheese and sardines and diet in general.   Hgb:    PCP Urine:  PCP  GYNECOLOGIC HISTORY: Patient's last menstrual period was 11/10/2006. Sexually active:  no Partner preference: female Contraception:  Vasectomy/postmenopausal  Menopausal hormone therapy:  DES exposure:  no  Blood transfusions:  no  Sexually transmitted diseases:   no GYN procedures and prior surgeries:  none Last mammogram:  06-23-12 wnl:[facility]             Last pap and high risk HPV testing:  05-28-10 wnl  History of abnormal pap smear:  no   OB History   Grav Para Term Preterm Abortions TAB SAB Ect Mult Living   2 2        2        LIFESTYLE: Exercise:    Treadmill, stationary bik            OTHER HEALTH MAINTENANCE: Tetanus/TDap:  2008 HPV:                  n/a Influenza:          08/2013   Bone density:     06-23-2012 TM[facility]:DQQIW Colonoscopy:    2002 LNL:GXQJJ[facility] GI.  Doing IFOB with PCP office.   Cholesterol check:  Normal 03/2014 through husband office.  Family History  Problem Relation Age of Onset  . CVA Maternal Grandfather     Patient Active Problem List   Diagnosis Date Noted  . Alopecia 02/09/2013  . Palpitations 06/13/2011  . Chest pain 06/13/2011  . ATRIAL FIBRILLATION 12/31/2009  . FATIGUE 12/31/2009   Past Medical History  Diagnosis Date  . Atrial fibrillation     s/p DCCV in 3/11  . Other malaise and fatigue   . Lower extremity edema     Right  . Hepatitis B infection     Chronic Hep B carrier, on suppressive Tx  . Hemorrhage anterior chamber eye     On  pradaxa  . Fibroid   . Anemia     as a child  . 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 Hayfever     allergic to    Past Surgical History  Procedure Laterality Date  . Cardioversion  04/02/2012    Procedure: CARDIOVERSION;  Surgeon: Deboraha Sprang, MD;  Location: [facility];  Service: Cardiovascular;  Laterality: N/A;  . Cataract extraction Bilateral 03/2011 2013    03/2011 left, 2013 right  . Cardioversion N/A 09/12/2013    Procedure: CARDIOVERSION;  Surgeon: Deboraha Sprang, MD;  Location: [place];  Service: Cardiovascular;  Laterality: N/A;  . Cardioversion N/A 01/26/2014    Procedure: CARDIOVERSION;  Surgeon: Deboraha Sprang, MD;  Location: [facility] ENDOSCOPY;  Service: Cardiovascular;  Laterality: N/A;    ALLERGIES: Review of patient's allergies indicates no known allergies.  Current Outpatient Prescriptions  Medication Sig Dispense Refill  . beta carotene w/minerals (OCUVITE) tablet Take 1 tablet by mouth daily.       . cholecalciferol (VITAMIN D) 1000 UNITS tablet  Take 1,000 Units by mouth daily.      . dabigatran (PRADAXA) 150 MG CAPS capsule Take 150 mg by mouth every 12 (twelve) hours.      . Magnesium 250 MG TABS Take 250 mg by mouth daily.       . Multiple Vitamins-Minerals (CENTRUM SILVER PO) Take 1 tablet by mouth daily.       . Omega-3 Fatty Acids (OMEGA 3 PO) Take 1 tablet by mouth 3 (three) times daily.      . verapamil (CALAN-SR) 120 MG CR tablet TAKE 1 TABLET BY MOUTH EVERY DAY AT BEDTIME  90 tablet  0  . vitamin C (ASCORBIC ACID) 500 MG tablet Take 500 mg by mouth daily.       No current facility-administered medications for this visit.     ROS:  Pertinent items are noted in HPI.  History   Social History  . Marital Status: Married    Spouse Name: N/A    Number of Children: N/A  . Years of Education: N/A   Occupational History  . Not on file.   Social History Main Topics  . Smoking status: Never Smoker   . Smokeless tobacco: Never Used  . Alcohol Use: No  .  Drug Use: No  . Sexual Activity: No     Comment: husband had vasectomy    Other Topics Concern  . Not on file   Social History Narrative   Regular exercise   Patient's husband is English as a second language teacher for [employer].   PHYSICAL EXAMINATION:    BP 110/76  Pulse 80  Resp 20  Ht 5' 2.5" (1.588 m)  Wt 99 lb 9.6 oz (45.178 kg)  BMI 17.92 kg/m2  LMP 11/10/2006   Wt Readings from Last 3 Encounters:  07/05/14 99 lb 9.6 oz (45.178 kg)  02/08/14 101 lb (45.813 kg)  01/26/14 102 lb (46.267 kg)     Ht Readings from Last 3 Encounters:  07/05/14 5' 2.5" (1.588 m)  02/08/14 5' 3.5" (1.613 m)  01/26/14 5' 3.5" (1.613 m)    General appearance: alert, cooperative and appears stated age Head: Normocephalic, without obvious abnormality, atraumatic Neck: no adenopathy, supple, symmetrical, trachea midline and thyroid not enlarged, symmetric, no tenderness/mass/nodules Lungs: clear to auscultation bilaterally Breasts: Inspection negative, No nipple retraction or dimpling, No nipple discharge or bleeding, No axillary or supraclavicular adenopathy, Normal to palpation without dominant masses Heart: regular rate and rhythm Abdomen: soft, non-tender; no masses,  no organomegaly Extremities: extremities normal, atraumatic, no cyanosis or edema Skin: Skin color, texture, turgor normal. No rashes or lesions Lymph nodes: Cervical, supraclavicular, and axillary nodes normal. No abnormal inguinal nodes palpated Neurologic: Grossly normal  Pelvic: External genitalia:  Abrasions of medical thighs (Rides stationary bike often.)              Urethra:  normal appearing urethra with no masses, tenderness or lesions.  Erythema around urethra - likely due to stationary bike use.  No specific lesions.               Bartholins and Skenes: normal                 Vagina: normal appearing vagina with normal color and discharge, no lesions.  Atrophy noted vaginally.               Cervix: normal appearance              Pap and  high risk HPV testing done: Yes.  Bimanual Exam:  Uterus:  uterus is normal size, shape, consistency and nontender                                      Adnexa: normal adnexa in size, nontender and no masses                                      Rectovaginal:  Yes.                                        Confirms above.                                      Anus:  normal sphincter tone, no lesions  ASSESSMENT  Normal gynecologic exam.  PLAN  Mammogram recommended yearly starting at age 59. Mammogram and bone density at [facility].  Pap smear and high risk HPV testing as above. Counseled on self breast exam, Calcium and vitamin D intake, exercise. See lab orders: No. Return annually or prn   An After Visit Summary was printed and given to the patient.

## 2014-07-07 LAB — IPS PAP TEST WITH HPV

## 2014-07-10 ENCOUNTER — Other Ambulatory Visit: Payer: Self-pay | Admitting: Internal Medicine

## 2014-07-10 ENCOUNTER — Telehealth: Payer: Self-pay | Admitting: Obstetrics and Gynecology

## 2014-07-10 NOTE — Telephone Encounter (Signed)
Patient is asking for recent pap results. Patient also needs an order for BMD and MMG to [facility].

## 2014-07-10 NOTE — Telephone Encounter (Signed)
Spoke with patient. Advised of pap results as seen below. Patient is agreeable. Advised order for BMD and MMG faxed to [facility]. Patient is agreeable. Patient states she is scheduled for this Thursday for BMD and MMG.   Notes Recorded by Lowella Fairy, CMA on 07/07/2014 at 1:16 PM 02 pap recall done 07-12-15. Notes Recorded by Jamey Reas de Berton Lan, MD on 07/07/2014 at 11:09 AM Pap and HR HPV negative.  Recall - 02.  Routing to provider for final review. Patient agreeable to disposition. Will close encounter

## 2014-07-26 ENCOUNTER — Telehealth: Payer: Self-pay

## 2014-07-26 NOTE — Telephone Encounter (Signed)
Attempted to reach patient at number provided [phone]. Phone rang and rang with no answer. Will try again later. Need to advise patient of BMD results as seen below from Dr.Silva's notes.  "Please report osteopenia to patient some decrease in bone density but treatment not indicated. Continue calcium, vitamin D, and weight bearing exercise. Repeat in two years."

## 2014-07-28 NOTE — Telephone Encounter (Signed)
Attempted to reach patient at number provided [phone]. Phone rang and rang with no answer. Will try again later. Need to advise patient of BMD results as seen below from Dr.Silva's notes.

## 2014-08-03 NOTE — Telephone Encounter (Signed)
Attempted to reach patient at number provided [phone]. Phone rang and rang with no answer. Need to advised of BMD results as seen below from Dr.Silva's notes. Note created and saved in EPIC to send to patient regarding need to call office for results. Letter sent to address on file.

## 2014-08-03 NOTE — Telephone Encounter (Signed)
Thank you for sending the letter

## 2014-08-10 NOTE — Telephone Encounter (Signed)
Spoke with patient. Patient received letter from office. Advised of results and message from BMD from [place] as seen below. Patient is agreeable and verbalizes understanding. Apologies for trouble reaching her by phone and states that her husbands work number which is on file can be a better way to reach her.  Routing to provider for final review. Patient agreeable to disposition. Will close encounter

## 2014-09-01 ENCOUNTER — Ambulatory Visit (INDEPENDENT_AMBULATORY_CARE_PROVIDER_SITE_OTHER): Payer: BC Managed Care – PPO | Admitting: Internal Medicine

## 2014-09-01 ENCOUNTER — Encounter: Payer: Self-pay | Admitting: Internal Medicine

## 2014-09-01 VITALS — BP 102/82 | HR 80 | Ht 63.5 in | Wt 100.8 lb

## 2014-09-01 DIAGNOSIS — I4891 Unspecified atrial fibrillation: Secondary | ICD-10-CM

## 2014-09-01 MED ORDER — DABIGATRAN ETEXILATE MESYLATE 75 MG PO CAPS: 75.0000 mg | ORAL_CAPSULE | Freq: Two times a day (BID) | ORAL | Status: DC

## 2014-09-01 NOTE — Progress Notes (Signed)
Patient Care Team: Marjorie Smolder, MD as PCP - General (Family Medicine)   HPI  Connie Chase is a 68 y.o. female Seen in followup for recent cardioversion that was done without antiarrhythmic support.  She is not well tolerated flecainide because of dizziness. When seen in the fall, she was unaware for atrial fibrillation and we decided transiently to pursue rate control.  She continues on a course of rate control. She is variably aware of her atrial fibrillation and is trying to cope with it.  She has noted some bleeding. She has researched and found that turmeric can promote bleeding. She wonders whether we should reduce her dose of dabigitran from 150--75 twice daily. She also takes ginger.  She wonders for her whether her officials kidney aggravating her tendency to bleed.   Her last echocardiogram 9/12 demonstrated atrial dimension of 1.97 Past Medical History  Diagnosis Date  . Atrial fibrillation     s/p DCCV in 3/11  . Other malaise and fatigue   . Lower extremity edema     Right  . Hepatitis B infection     Chronic Hep B carrier, on suppressive Tx  . Hemorrhage anterior chamber eye     On pradaxa  . Fibroid   . Anemia     as a child  . 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 Hayfever     allergic to    Past Surgical History  Procedure Laterality Date  . Cardioversion  04/02/2012    Procedure: CARDIOVERSION;  Surgeon: Deboraha Sprang, MD;  Location: [facility] OR;  Service: Cardiovascular;  Laterality: N/A;  . Cataract extraction Bilateral 03/2011 2013    03/2011 left, 2013 right  . Cardioversion N/A 09/12/2013    Procedure: CARDIOVERSION;  Surgeon: Deboraha Sprang, MD;  Location: [place];  Service: Cardiovascular;  Laterality: N/A;  . Cardioversion N/A 01/26/2014    Procedure: CARDIOVERSION;  Surgeon: Deboraha Sprang, MD;  Location: [facility] ENDOSCOPY;  Service: Cardiovascular;  Laterality: N/A;    Current Outpatient Prescriptions  Medication Sig Dispense Refill  .  beta carotene w/minerals (OCUVITE) tablet Take 1 tablet by mouth daily.       . cholecalciferol (VITAMIN D) 1000 UNITS tablet Take 1,000 Units by mouth daily.      . Magnesium 250 MG TABS Take 250 mg by mouth daily.       . Multiple Vitamins-Minerals (CENTRUM SILVER PO) Take 1 tablet by mouth daily.       . Omega-3 Fatty Acids (OMEGA 3 PO) Take 4 tablets by mouth daily. 2 tabs in the am, 1 tab at lunch and 1 tab in the pm.      . PRADAXA 150 MG CAPS capsule TAKE ONE CAPSULE BY MOUTH TWICE A DAY  180 capsule  0  . tenofovir (VIREAD) 300 MG tablet Take 300 mg by mouth daily.      . verapamil (CALAN-SR) 120 MG CR tablet TAKE 1 TABLET BY MOUTH EVERY DAY AT BEDTIME  90 tablet  0  . vitamin C (ASCORBIC ACID) 500 MG tablet Take 500 mg by mouth daily.       No current facility-administered medications for this visit.    No Known Allergies  Review of Systems negative except from HPI and PMH  Physical Exam BP 102/82  Pulse 80  Ht 5' 3.5" (1.613 m)  Wt 100 lb 12.8 oz (45.723 kg)  BMI 17.57 kg/m2  LMP 11/10/2006 Well developed and well  nourished in no acute distress HENT normal E scleral and icterus clear Neck Supple JVP Clear to ausculation irregularly irregular   no murmurs gallops or rub Soft with active bowel sounds No clubbing cyanosis  Edema Alert and oriented, grossly normal motor and sensory function Skin Warm and Dry  ECG demonstrates atrial fibrillation at 87 intervals-/70/34. Assessment and  Plan  Atrial fibrillation  Glaucoma  She has persistent atrial fibrillation. We again discussed rhythm control versus rate control. She would like to think about this more and we'll anticipate needing again in about 4 or 5 months to discuss the potential-role of   dofetilide. In anticipation of that we will obtain an echocardiogram to look at the left atrial dimension.  We also had a lengthy discussion regarding anticoagulation. We reviewed the data from the Medicare dabigitran study  which interestingly showed the effectiveness of 75 mg twice daily even in October of patients with normal renal function. It is noteworthy also at the effectiveness at this dose was less than in the effectiveness of 150 mg dose twice daily. Still however, with her tendency to use turmeric daily and its potential to increase anticoagulation, she would like a lower dose and I think that this is reasonable. She is also noted that the verapamil may aggravate problems with glaucoma when taken at night. She will start to take in the morning or midday.

## 2014-09-01 NOTE — Patient Instructions (Signed)
DECREASE: Pradaxa to 75 mg 1 tablet twice a day  Your physician wants you to follow-up in:  March with Dr. Caryl Comes.  You will receive a reminder letter in the mail two months in advance. If you don't receive a letter, please call our office to schedule the follow-up appointment.  Your physician has requested that you have an echocardiogram in March 2016.  Echocardiography is a painless test that uses sound waves to create images of your heart. It provides your doctor with information about the size and shape of your heart and how well your heart's chambers and valves are working. This procedure takes approximately one hour. There are no restrictions for this procedure.

## 2014-09-05 ENCOUNTER — Other Ambulatory Visit: Payer: Self-pay | Admitting: Gastroenterology

## 2014-09-05 DIAGNOSIS — B1910 Unspecified viral hepatitis B without hepatic coma: Secondary | ICD-10-CM

## 2014-09-11 ENCOUNTER — Encounter: Payer: Self-pay | Admitting: Internal Medicine

## 2014-10-09 ENCOUNTER — Other Ambulatory Visit: Payer: Self-pay | Admitting: Internal Medicine

## 2014-12-26 ENCOUNTER — Telehealth: Payer: Self-pay | Admitting: Internal Medicine

## 2014-12-26 NOTE — Telephone Encounter (Signed)
New Message  Pt had questions about '3 day hospital stay'. Please call back and discuss.  Can use l;isted # but also can try husbands cell- [phone]

## 2014-12-28 NOTE — Telephone Encounter (Addendum)
Patient had some questions about hospital room, is it private, can she get one at the end of the hall, what does it have in the room, etc.  I answered all of her questions about the hospital rooms.  Patient is very nervous about being in the hospital - she has not been in one since the birth of her child many years ago. She was asking about average cost of stay for Tikosyn loading.  I informed her to call [phone] for information. She was thankful for answering questions to help relieve anxiety. She will see [place] in April.

## 2014-12-28 NOTE — Telephone Encounter (Signed)
Follow Up  Pt husband returning [place] phone call. Please call back and discuss.

## 2015-01-04 ENCOUNTER — Ambulatory Visit
Admission: RE | Admit: 2015-01-04 | Discharge: 2015-01-04 | Disposition: A | Discharge status: Home / Self Care | Payer: BLUE CROSS/BLUE SHIELD | Source: Ambulatory Visit | Attending: Gastroenterology | Admitting: Gastroenterology

## 2015-01-04 DIAGNOSIS — B1910 Unspecified viral hepatitis B without hepatic coma: Secondary | ICD-10-CM

## 2015-02-08 ENCOUNTER — Encounter: Payer: Self-pay | Admitting: Internal Medicine

## 2015-02-08 ENCOUNTER — Other Ambulatory Visit: Payer: Self-pay | Admitting: Gastroenterology

## 2015-02-08 DIAGNOSIS — B181 Chronic viral hepatitis B without delta-agent: Secondary | ICD-10-CM

## 2015-02-16 ENCOUNTER — Ambulatory Visit (HOSPITAL_COMMUNITY): Payer: BLUE CROSS/BLUE SHIELD | Attending: Internal Medicine | Admitting: Radiology

## 2015-02-16 DIAGNOSIS — I340 Nonrheumatic mitral (valve) insufficiency: Secondary | ICD-10-CM | POA: Insufficient documentation

## 2015-02-16 DIAGNOSIS — I4891 Unspecified atrial fibrillation: Secondary | ICD-10-CM | POA: Insufficient documentation

## 2015-02-16 NOTE — Progress Notes (Signed)
Echocardiogram performed.  

## 2015-02-20 ENCOUNTER — Ambulatory Visit: Payer: Self-pay | Admitting: Internal Medicine

## 2015-02-20 ENCOUNTER — Other Ambulatory Visit: Payer: Self-pay | Admitting: Internal Medicine

## 2015-02-21 ENCOUNTER — Other Ambulatory Visit: Payer: Self-pay | Admitting: Internal Medicine

## 2015-02-27 ENCOUNTER — Encounter: Payer: Self-pay | Admitting: Internal Medicine

## 2015-02-27 ENCOUNTER — Ambulatory Visit (INDEPENDENT_AMBULATORY_CARE_PROVIDER_SITE_OTHER): Payer: BLUE CROSS/BLUE SHIELD | Admitting: Internal Medicine

## 2015-02-27 VITALS — BP 108/70 | HR 87 | Ht 63.0 in | Wt 103.4 lb

## 2015-02-27 DIAGNOSIS — I4891 Unspecified atrial fibrillation: Secondary | ICD-10-CM

## 2015-02-27 MED ORDER — APIXABAN 5 MG PO TABS: 5.0000 mg | ORAL_TABLET | Freq: Two times a day (BID) | ORAL | Status: DC

## 2015-02-27 NOTE — Patient Instructions (Addendum)
Medication Instructions:   Your physician has recommended you make the following change in your medication:   Stop Pradaxa. Start Eliquis 5 mg by mouth twice daily.   Labwork:  none  Testing/Procedures:   none Follow-Up:  Call us when you would like to schedule admission to hospital.  Admission should be in about 3 weeks.

## 2015-02-27 NOTE — Progress Notes (Addendum)
Patient Care Team: Darcus Austin, MD as PCP - General (Family Medicine)   HPI  Connie Chase is a 69 y.o. female Seen in followup for recent cardioversion that was done without antiarrhythmic support.  She is not well tolerated flecainide because of dizziness. When seen in the fall, she was unaware for atrial fibrillation and we decided transiently to pursue rate control.  She continues on a course of rate control. She is variably aware of her atrial fibrillation  ; she complains of significant and progressive fatigue and weakness. She wonders whether the dabigitran is contributing somewhat to her  weakness. There is no clear evidence that she has muscle weakness.   Her last echocardiogram  4/16 demonstrated normal LV function  With left atrial dimensions suggesting mild-moderate enlargement at 40 /2.8 /40 --  Qualitative description was of  Left atrial dimension being mild  Past Medical History  Diagnosis Date  . Atrial fibrillation     s/p DCCV in 3/11  . Other malaise and fatigue   . Lower extremity edema     Right  . Hepatitis B infection     Chronic Hep B carrier, on suppressive Tx  . Hemorrhage anterior chamber eye     On pradaxa  . Fibroid   . Anemia     as a child  . 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 Hayfever     allergic to    Past Surgical History  Procedure Laterality Date  . Cardioversion  04/02/2012    Procedure: CARDIOVERSION;  Surgeon: Deboraha Sprang, MD;  Location: [facility] OR;  Service: Cardiovascular;  Laterality: N/A;  . Cataract extraction Bilateral 03/2011 2013    03/2011 left, 2013 right  . Cardioversion N/A 09/12/2013    Procedure: CARDIOVERSION;  Surgeon: Deboraha Sprang, MD;  Location: [place];  Service: Cardiovascular;  Laterality: N/A;  . Cardioversion N/A 01/26/2014    Procedure: CARDIOVERSION;  Surgeon: Deboraha Sprang, MD;  Location: [facility] ENDOSCOPY;  Service: Cardiovascular;  Laterality: N/A;    Current Outpatient Prescriptions  Medication  Sig Dispense Refill  . beta carotene w/minerals (OCUVITE) tablet Take 1 tablet by mouth daily.     . cholecalciferol (VITAMIN D) 1000 UNITS tablet Take 1,000 Units by mouth daily.    . Magnesium 250 MG TABS Take 250 mg by mouth daily.     . Multiple Vitamins-Minerals (CENTRUM SILVER PO) Take 1 tablet by mouth daily.     . Omega-3 Fatty Acids (OMEGA 3 PO) Take 4 tablets by mouth daily. 2 tabs in the am, 1 tab at lunch and 1 tab in the pm.    . PRADAXA 75 MG CAPS capsule TAKE 1 CAPSULE (75 MG TOTAL) BY MOUTH 2 (TWO) TIMES DAILY. 60 capsule 1  . tenofovir (VIREAD) 300 MG tablet Take 300 mg by mouth daily.    . verapamil (CALAN-SR) 120 MG CR tablet Take 120 mg by mouth every morning.    . vitamin C (ASCORBIC ACID) 500 MG tablet Take 500 mg by mouth daily.     No current facility-administered medications for this visit.    No Known Allergies  Review of Systems negative except from HPI and PMH  Physical Exam BP 108/70 mmHg  Pulse 87  Ht 5\' 3"  (1.6 m)  Wt 103 lb 6.4 oz (46.902 kg)  BMI 18.32 kg/m2  LMP 11/10/2006 Well developed and well nourished in no acute distress HENT normal E scleral and icterus clear Neck Supple  JVP Clear to ausculation irregularly irregular   no murmurs gallops or rub Soft with active bowel sounds No clubbing cyanosis  Edema Alert and oriented, grossly normal motor and sensory function Skin Warm and Dry  ECG demonstrates atrial fibrillation at 87 intervals-/80/34. Assessment and  Plan  Atrial fibrillation  Glaucoma  She has persistent atrial fibrillation. We again discussed rhythm control versus rate control.   she's feeling increasingly weak. She is at this point inclined to pursue a trial of rhythm control. To pursue this however, we have had to negotiate full dose anticoagulation; we will discontinue her Pradaxa and put her on apixaban 5 mg twice daily. We will anticipate in 3 weeks hospitalization for the initiation of dofetilide. She is at this  point disinclined to take 500 g twice daily although this would be the dose suggested by her renal function. She will decide in the interim how much dofetilide she is willing to take.   She understands that she would undergo cardioversion after 48 hours in hospital. She understands that she would spend 72 hours in hospital.   We have reviewed the data for [facility] safety particularly related to the  [facility].  We spent more than 50% of our >45 min visit in face to face counseling regarding the above

## 2015-03-19 ENCOUNTER — Telehealth: Payer: Self-pay | Admitting: Internal Medicine

## 2015-03-19 NOTE — Telephone Encounter (Signed)
New Message   Patient is calling wanting to schedule a 3 day hospital stay for  tikoson / to check patient kidney function. Please give patient a call.

## 2015-03-20 MED ORDER — DILTIAZEM HCL ER COATED BEADS 120 MG PO CP24: 120.0000 mg | ORAL_CAPSULE | Freq: Every day | ORAL | Status: DC

## 2015-03-20 NOTE — Telephone Encounter (Signed)
Follow UP  Pt called to follow up on the tikosyn message that was previously sent//sr

## 2015-03-20 NOTE — Telephone Encounter (Signed)
Spoke with pt.  Scheduled for Tikosyn initiation on 5/17.  Will need to change verapamil to diltiazem.  New Rx sent to pharmacy.

## 2015-03-26 ENCOUNTER — Other Ambulatory Visit: Payer: Self-pay | Admitting: *Deleted

## 2015-03-26 MED ORDER — APIXABAN 5 MG PO TABS: 5.0000 mg | ORAL_TABLET | Freq: Two times a day (BID) | ORAL | Status: DC

## 2015-03-27 ENCOUNTER — Encounter (HOSPITAL_COMMUNITY): Payer: Self-pay | Admitting: General Practice

## 2015-03-27 ENCOUNTER — Ambulatory Visit (INDEPENDENT_AMBULATORY_CARE_PROVIDER_SITE_OTHER): Payer: BLUE CROSS/BLUE SHIELD | Admitting: Pharmacist

## 2015-03-27 ENCOUNTER — Telehealth: Payer: Self-pay | Admitting: Pharmacist

## 2015-03-27 ENCOUNTER — Inpatient Hospital Stay (HOSPITAL_COMMUNITY)
Admission: AD | Admit: 2015-03-27 | Discharge: 2015-03-30 | DRG: 309 | Disposition: A | Discharge status: Home / Self Care | Payer: BLUE CROSS/BLUE SHIELD | Source: Ambulatory Visit | Attending: Internal Medicine | Admitting: Internal Medicine

## 2015-03-27 DIAGNOSIS — I4891 Unspecified atrial fibrillation: Secondary | ICD-10-CM

## 2015-03-27 DIAGNOSIS — B181 Chronic viral hepatitis B without delta-agent: Secondary | ICD-10-CM | POA: Diagnosis present

## 2015-03-27 DIAGNOSIS — I481 Persistent atrial fibrillation: Secondary | ICD-10-CM | POA: Diagnosis present

## 2015-03-27 DIAGNOSIS — Z2251 Carrier of viral hepatitis B: Secondary | ICD-10-CM | POA: Diagnosis not present

## 2015-03-27 DIAGNOSIS — I4819 Other persistent atrial fibrillation: Secondary | ICD-10-CM | POA: Diagnosis present

## 2015-03-27 DIAGNOSIS — Z7902 Long term (current) use of antithrombotics/antiplatelets: Secondary | ICD-10-CM | POA: Diagnosis not present

## 2015-03-27 DIAGNOSIS — Z79899 Other long term (current) drug therapy: Secondary | ICD-10-CM | POA: Diagnosis not present

## 2015-03-27 HISTORY — DX: Unspecified osteoarthritis, unspecified site: M19.90

## 2015-03-27 HISTORY — DX: Other persistent atrial fibrillation: I48.19

## 2015-03-27 LAB — BASIC METABOLIC PANEL
Anion gap: 9 (ref 5–15)
BUN: 18 mg/dL (ref 6–20)
CALCIUM: 9.2 mg/dL (ref 8.9–10.3)
CO2: 28 mmol/L (ref 22–32)
Chloride: 101 mmol/L (ref 101–111)
Creatinine, Ser: 0.49 mg/dL (ref 0.44–1.00)
GFR calc Af Amer: >60 mL/min (ref >60)
GFR calc non Af Amer: >60 mL/min (ref >60)
Glucose, Bld: 128 mg/dL — ABNORMAL HIGH (ref 65–99)
Potassium: 3.8 mmol/L (ref 3.5–5.1)
Sodium: 138 mmol/L (ref 135–145)

## 2015-03-27 LAB — MAGNESIUM: Magnesium: 2.3 mg/dL (ref 1.7–2.4)

## 2015-03-27 LAB — POTASSIUM: Potassium: 4.1 mmol/L (ref 3.5–5.1)

## 2015-03-27 MED ORDER — SODIUM CHLORIDE 0.9 % IV SOLN
250.0000 mL | INTRAVENOUS | Status: DC | PRN
  Administered 2015-03-30 (×2): via INTRAVENOUS

## 2015-03-27 MED ORDER — SODIUM CHLORIDE 0.9 % IJ SOLN: 3.0000 mL | INTRAMUSCULAR | Status: DC | PRN

## 2015-03-27 MED ORDER — POTASSIUM CHLORIDE CRYS ER 20 MEQ PO TBCR
20.0000 meq | EXTENDED_RELEASE_TABLET | Freq: Every day | ORAL | Status: DC
  Administered 2015-03-27 – 2015-03-29 (×3): 20 meq via ORAL
  Filled 2015-03-27 (×5): qty 1

## 2015-03-27 MED ORDER — MAGNESIUM OXIDE 400 (241.3 MG) MG PO TABS
400.0000 mg | ORAL_TABLET | Freq: Every day | ORAL | Status: DC
  Administered 2015-03-27 – 2015-03-28 (×2): 400 mg via ORAL
  Filled 2015-03-27 (×4): qty 1

## 2015-03-27 MED ORDER — VITAMIN D3 25 MCG (1000 UNIT) PO TABS
1000.0000 [IU] | ORAL_TABLET | Freq: Every day | ORAL | Status: DC
  Administered 2015-03-28 – 2015-03-29 (×2): 1000 [IU] via ORAL
  Filled 2015-03-27 (×3): qty 1

## 2015-03-27 MED ORDER — APIXABAN 5 MG PO TABS
5.0000 mg | ORAL_TABLET | Freq: Two times a day (BID) | ORAL | Status: DC
  Administered 2015-03-28 – 2015-03-30 (×5): 5 mg via ORAL
  Filled 2015-03-27 (×8): qty 1

## 2015-03-27 MED ORDER — ENTECAVIR 1 MG PO TABS
1.0000 mg | ORAL_TABLET | Freq: Every day | ORAL | Status: DC
  Administered 2015-03-28 – 2015-03-30 (×3): 1 mg via ORAL
  Filled 2015-03-27 (×3): qty 1

## 2015-03-27 MED ORDER — OCUVITE PO TABS
1.0000 | ORAL_TABLET | Freq: Every day | ORAL | Status: DC
  Administered 2015-03-28 – 2015-03-29 (×2): 1 via ORAL
  Filled 2015-03-27 (×3): qty 1

## 2015-03-27 MED ORDER — SODIUM CHLORIDE 0.9 % IJ SOLN
3.0000 mL | Freq: Two times a day (BID) | INTRAMUSCULAR | Status: DC
  Administered 2015-03-27 – 2015-03-30 (×6): 3 mL via INTRAVENOUS

## 2015-03-27 MED ORDER — VITAMIN C 500 MG PO TABS
500.0000 mg | ORAL_TABLET | Freq: Every day | ORAL | Status: DC
  Administered 2015-03-28 – 2015-03-29 (×2): 500 mg via ORAL
  Filled 2015-03-27 (×3): qty 1

## 2015-03-27 MED ORDER — NON FORMULARY: 1.0000 mg | Freq: Every day | Status: DC

## 2015-03-27 MED ORDER — MAGNESIUM 250 MG PO TABS: 250.0000 mg | ORAL_TABLET | Freq: Every day | ORAL | Status: DC

## 2015-03-27 MED ORDER — DOFETILIDE 500 MCG PO CAPS
500.0000 ug | ORAL_CAPSULE | Freq: Two times a day (BID) | ORAL | Status: DC
  Filled 2015-03-27 (×2): qty 1

## 2015-03-27 MED ORDER — DOFETILIDE 500 MCG PO CAPS
500.0000 ug | ORAL_CAPSULE | Freq: Two times a day (BID) | ORAL | Status: DC
  Administered 2015-03-27 – 2015-03-30 (×6): 500 ug via ORAL
  Filled 2015-03-27 (×8): qty 1

## 2015-03-27 MED ORDER — DILTIAZEM HCL ER COATED BEADS 120 MG PO CP24
120.0000 mg | ORAL_CAPSULE | Freq: Every day | ORAL | Status: DC
  Administered 2015-03-30: 120 mg via ORAL
  Filled 2015-03-27 (×3): qty 1

## 2015-03-27 NOTE — Progress Notes (Signed)
Pt QT/QTc 412/444.  MD Philbert Riser paged and notified.  Ordered to go ahead and give this PM dose of tikosyn.  Will continue to monitor pt closely.  Claudette Stapler, RN

## 2015-03-27 NOTE — Progress Notes (Signed)
Pharmacy Review for Dofetilide (Tikosyn) Initiation  Admit Complaint: 69 y.o. female admitted 03/27/2015 with atrial fibrillation to be initiated on dofetilide.   Assessment:  Patient Exclusion Criteria: If any screening criteria checked as "Yes", then  patient  should NOT receive dofetilide until criteria item is corrected. If "Yes" please indicate correction plan.  YES  NO Patient  Exclusion Criteria Correction Plan  []  []  Baseline QTc interval is greater than or equal to 440 msec. IF above YES box checked dofetilide contraindicated unless patient has ICD; then may proceed if QTc 500-550 msec or with known ventricular conduction abnormalities may proceed with QTc 550-600 msec. QTc =     []  [x]  Magnesium level is less than 1.8 mEq/l : Last magnesium:  Lab Results  Component Value Date   MG 2.3 03/27/2015         []  [x]  Potassium level is less than 4 mEq/l : Last potassium:  Lab Results  Component Value Date   K 4.1 03/27/2015         []  [x]  Patient is known or suspected to have a digoxin level greater than 2 ng/ml: Lab Results  Component Value Date   DIGOXIN 0.3* 08/05/2011      []  []  Creatinine clearance less than 20 ml/min (calculated using Cockcroft-Gault, actual body weight and serum creatinine): CrCl cannot be calculated (Unknown ideal weight.). CrCl ~ 60   []  [x]  Patient has received drugs known to prolong the QT intervals within the last 48 hours (phenothiazines, tricyclics or tetracyclic antidepressants, erythromycin, H-1 antihistamines, cisapride, fluoroquinolones, azithromycin). Drugs not listed above may have an, as yet, undetected potential to prolong the QT interval, updated information on QT prolonging agents is available at this website:QT prolonging agents   []  [x]  Patient received a dose of hydrochlorothiazide (Oretic) alone or in any combination including triamterene (Dyazide, Maxzide) in the last 48 hours.   []  [x]  Patient received a medication known to  increase dofetilide plasma concentrations prior to initial dofetilide dose:  . Trimethoprim (Primsol, Proloprim) in the last 36 hours . Verapamil (Calan, Verelan) in the last 36 hours or a sustained release dose in the last 72 hours . Megestrol (Megace) in the last 5 days  . Cimetidine (Tagamet) in the last 6 hours . Ketoconazole (Nizoral) in the last 24 hours . Itraconazole (Sporanox) in the last 48 hours  . Prochlorperazine (Compazine) in the last 36 hours    []  [x]  Patient is known to have a history of torsades de pointes; congenital or acquired long QT syndromes.   []  [x]  Patient has received a Class 1 antiarrhythmic with less than 2 half-lives since last dose. (Disopyramide, Quinidine, Procainamide, Lidocaine, Mexiletine, Flecainide, Propafenone)   []  [x]  Patient has received amiodarone therapy in the past 3 months or amiodarone level is greater than 0.3 ng/ml.    Patient has been appropriately anticoagulated with Apixaban 5 mg BID.  Ordering provider was confirmed at [email or web address] if they are not listed on the [place] Prescribers list.  Goal of Therapy: Follow renal function, electrolytes, potential drug interactions, and dose adjustment. Provide education and 1 week supply at discharge.  Plan:  [x]   Physician selected initial dose within range recommended for patients level of renal function - will monitor for response.  []   Physician selected initial dose outside of range recommended for patients level of renal function - will discuss if the dose should be altered at this time.   Select One Calculated CrCl  Dose q12h  [x]  >  60 ml/min 500 mcg  []  40-60 ml/min 250 mcg  []  20-40 ml/min 125 mcg   2. Follow up QTc after the first 5 doses, renal function, electrolytes (K & Mg) daily x 3     days, dose adjustment, success of initiation and facilitate 1 week discharge supply as     clinically indicated.  3. Initiate Tikosyn education video (Call [phone] and ask for video  # [phone]).  4. Place Enrollment Form on the chart for discharge supply of dofetilide.   Uvaldo Rising, BCPS  Clinical Pharmacist Pager [phone]  03/27/2015 8:45 PM

## 2015-03-27 NOTE — Telephone Encounter (Signed)
New message      Calling to see if the lab results from today are back.  Please call

## 2015-03-27 NOTE — Progress Notes (Signed)
-   HPI  Connie Chase is a 69 y.o. female who is seen today for Tikosyn initiation.  She was seen by Dr. Caryl Comes in April for follow up of her atrial fibrillation.  She had a cardioversion in March but she did not maintain NSR.  She has failed flecainide in the past due to dizziness.  When she was seen by Dr. Caryl Comes in April, it was decided to attempt rhythm control again with Tikosyn.  She was placed on full dose Eliquis for anticoagulation.  Her tenofovir was changed to entecavir for potential drug interaction and verapamil changed to diltiazem.    Reviewed potential side effects with patient today including QTc prolongation.  She is aware of the importance of not missing any doses of Tikosyn and to call us if she misses more than 2 doses in a row.  She reports compliance with Eliquis for the last 4 weeks.  Her medications have been adjusted so that she is no longer taking any contraindicated medications or QTc prolongating medications.    EKG reviewed by Dr. Lovena Le.  Atrial fibrillation with vent rate of 74 bpm.  QTc 417 msec.   Past Medical History  Diagnosis Date  . Persistent atrial fibrillation     a. s/p DCCV b. intolerant of Flecainide  . Hemorrhage anterior chamber eye     On pradaxa  . 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 Hepatitis B infection     Chronic Hep B carrier, on suppressive Tx  . Osteoarthritis           Past Surgical History  Procedure Laterality Date  . Cardioversion  04/02/2012    Procedure: CARDIOVERSION;  Surgeon: Deboraha Sprang, MD;  Location[facility] OR;  Service: Cardiovascular;  Laterality: N/A;  . Cataract extraction w/ intraocular lens implant Left 03/2011  . Cardioversion N/A 09/12/2013    Procedure: CARDIOVERSION;  Surgeon: Deboraha Sprang, MD;  Location: [place];  Service: Cardiovascular;  Laterality: N/A;  . Cardioversion N/A 01/26/2014    Procedure: CARDIOVERSION;  Surgeon: Deboraha Sprang, MD;  Location: [facility] ENDOSCOPY;  Service: Cardiovascular;   Laterality: N/A;  . Cataract extraction w/ intraocular lens implant Right 2013    No current facility-administered medications for this visit.   No current outpatient prescriptions on file.   Facility-Administered Medications Ordered in Other Visits  Medication Dose Route Frequency Provider Last Rate Last Dose  . 0.9 %  sodium chloride infusion  250 mL Intravenous PRN Amber Vertis Kelch, NP      . apixaban (ELIQUIS) tablet 5 mg  5 mg Oral BID Amber Vertis Kelch, NP   5 mg at 03/28/15 2633  . beta carotene w/minerals (OCUVITE) tablet 1 tablet  1 tablet Oral Daily Amber Vertis Kelch, NP      . cholecalciferol (VITAMIN D) tablet 1,000 Units  1,000 Units Oral Daily Amber Vertis Kelch, NP      . diltiazem (CARDIZEM CD) 24 hr capsule 120 mg  120 mg Oral Daily Amber Vertis Kelch, NP      . dofetilide (TIKOSYN) capsule 500 mcg  500 mcg Oral BID Deboraha Sprang, MD   500 mcg at 03/28/15 3545  . entecavir (BARACLUDE) tablet 1 mg  1 mg Oral Daily Deboraha Sprang, MD   1 mg at 03/28/15 0411  . magnesium oxide (MAG-OX) tablet 400 mg  400 mg Oral QHS Deboraha Sprang, MD   400 mg at 03/27/15 2128  . potassium chloride SA (K-DUR,KLOR-CON) CR  tablet 20 mEq  20 mEq Oral Daily Rogelia Mire, NP   20 mEq at 03/27/15 1815  . sodium chloride 0.9 % injection 3 mL  3 mL Intravenous Q12H Amber Vertis Kelch, NP   3 mL at 03/27/15 2131  . sodium chloride 0.9 % injection 3 mL  3 mL Intravenous PRN Amber Vertis Kelch, NP      . vitamin C (ASCORBIC ACID) tablet 500 mg  500 mg Oral Daily Amber Vertis Kelch, NP       Assessment and Plan 1.  Atrial Fibrillation- By 3pm, lab results from this AM were not available.  To prevent delay in care, pt was admitted to the hospital with plans to recheck labs at the hospital prior to starting [facility].  Pt is aware to report to the hospital for admission.

## 2015-03-27 NOTE — H&P (Signed)
ELECTROPHYSIOLOGY HISTORY AND PHYSICAL    Patient ID: Connie Chase MRN: [id number], DOB/AGE: [birth year] 69 y.o.  Admit date: 03/27/2015  Primary Physician: Marjorie Smolder, MD Primary Cardiologist: Caryl Comes  CC: atrial fibrillation  HPI:  Connie Chase is a 69 y.o. female with past medical history significant for chronic hepatitis B and persistent atrial fibrillation.  Her atrial fibrillation was first diagnosed in 2011.  She underwent cardioversion in March of that year and has had intermittent recurrent atrial fibrillation.  She has been intolerant of Flecainide with dizziness.  She is symptomatic with her atrial fibrillation with palpitations and fatigue. Dr Caryl Comes discussed initiation of Tikosyn with the patient and she presents today to the hospital for that.  She reports compliance with Eliquis for the last 4 weeks.   Last echo 02/2015 demonstrated EF 55-60%, mild MR, LA 40.  She currently denies chest pain, shortness of breath, LE edema, recent fevers, chills, nausea or vomiting. She has not had dizziness or frank syncope.   Past Medical History  Diagnosis Date  . Persistent atrial fibrillation     a. s/p DCCV b. intolerant of Flecainide  . Hemorrhage anterior chamber eye     On pradaxa  . 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 Hepatitis B infection     Chronic Hep B carrier, on suppressive Tx  . Osteoarthritis            Surgical History:  Past Surgical History  Procedure Laterality Date  . Cardioversion  04/02/2012    Procedure: CARDIOVERSION;  Surgeon: Deboraha Sprang, MD;  Location: [facility] OR;  Service: Cardiovascular;  Laterality: N/A;  . Cataract extraction w/ intraocular lens implant Left 03/2011  . Cardioversion N/A 09/12/2013    Procedure: CARDIOVERSION;  Surgeon: Deboraha Sprang, MD;  Location: [place];  Service: Cardiovascular;  Laterality: N/A;  . Cardioversion N/A 01/26/2014    Procedure: CARDIOVERSION;  Surgeon: Deboraha Sprang, MD;  Location: [place];   Service: Cardiovascular;  Laterality: N/A;  . Cataract extraction w/ intraocular lens implant Right 2013     Prescriptions prior to admission  Medication Sig Dispense Refill Last Dose  . apixaban (ELIQUIS) 5 MG TABS tablet Take 1 tablet (5 mg total) by mouth 2 (two) times daily. 180 tablet 1 03/27/2015 at Unknown time  . beta carotene w/minerals (OCUVITE) tablet Take 1 tablet by mouth daily.    03/27/2015 at Unknown time  . cholecalciferol (VITAMIN D) 1000 UNITS tablet Take 1,000 Units by mouth daily.   03/27/2015 at Unknown time  . diltiazem (CARDIZEM CD) 120 MG 24 hr capsule Take 1 capsule (120 mg total) by mouth daily. 30 capsule 6 03/27/2015 at Unknown time  . entecavir (BARACLUDE) 1 MG tablet Take 1 mg by mouth daily.   03/27/2015 at Unknown time  . Magnesium 250 MG TABS Take 250 mg by mouth at bedtime.    03/26/2015 at Unknown time  . Multiple Vitamins-Minerals (CENTRUM SILVER PO) Take 1 tablet by mouth daily.    03/27/2015 at Unknown time  . Omega-3 Fatty Acids (OMEGA 3 PO) Take 4 tablets by mouth daily. 2 tabs in the am, 1 tab at lunch and 1 tab in the pm.   03/27/2015 at Unknown time  . Propylene Glycol (SYSTANE BALANCE OP) Place 1 drop into both eyes 3 (three) times daily.   03/27/2015 at Unknown time  . vitamin C (ASCORBIC ACID) 500 MG tablet Take 500 mg by mouth daily.   03/27/2015 at Unknown time  Inpatient Medications:   Allergies: No Known Allergies  History   Social History  . Marital Status: Married    Spouse Name: N/A  . Number of Children: N/A  . Years of Education: N/A   Occupational History  . Not on file.   Social History Main Topics  . Smoking status: Never Smoker   . Smokeless tobacco: Never Used  . Alcohol Use: No  . Drug Use: No  . Sexual Activity: No     Comment: husband had vasectomy    Other Topics Concern  . Not on file   Social History Narrative   Regular exercise     Family History  Problem Relation Age of Onset  . Other      Neg cardiac  family Hx     Review of Systems: All other systems reviewed and are otherwise negative except as noted above.  Physical Exam: Filed Vitals:   03/27/15 1557  BP: 109/73  Pulse: 80  Temp: 99.4 F (37.4 C)  TempSrc: Oral  Resp: 18  Height: 5\' 3"  (1.6 m)  SpO2: 100%    GEN- The patient is well appearing, alert and oriented x 3 today.   HEENT: normocephalic, atraumatic; sclera clear, conjunctiva pink; hearing intact; oropharynx clear; neck supple, no JVP Lymph- no cervical lymphadenopathy Lungs- Clear to ausculation bilaterally, normal work of breathing.  No wheezes, rales, rhonchi Heart- Irregular rate and rhythm, no murmurs, rubs or gallops  GI- soft, non-tender, non-distended, bowel sounds present  Extremities- no clubbing, cyanosis, or edema; DP/PT/radial pulses 2+ bilaterally MS- no significant deformity or atrophy Skin- warm and dry, no rash or lesion Psych- euthymic mood, full affect Neuro- strength and sensation are intact  Labs:   Lab Results  Component Value Date   WBC 4.5 01/19/2014   HGB 14.5 01/19/2014   HCT 42.8 01/19/2014   MCV 100.1* 01/19/2014   PLT 174.0 01/19/2014   No results for input(s): NA, K, CL, CO2, BUN, CREATININE, CALCIUM, PROT, BILITOT, ALKPHOS, ALT, AST, GLUCOSE in the last 168 hours.  Invalid input(s): LABALBU    EKG: per office report - atrial fibrillation, QTc 41msec  TELEMETRY: atrial fibrillation, ventricular rates 80-100  Assessment/Plan: 1.  Persistent atrial fibrillation The patient has persistent symptomatic atrial fibrillation and has failed medical therapy with Flecainide.  She presents today for initiation of Tikosyn.  QTc 435msec by EKG today in office. BMET, Mg pending.  Will initiate Tikosyn 552mcg twice daily pending repeat BMET today.   Keep K >3.9, Mg >1.8 Follow QTc by EKG  Continue Eliquis for CHADS2VASC score of 2 Plan DCCV on Thursday if not converted to SR spontaneously by then.   2.  Chronic hepatitis B Will  ask pharmacy to help manage home medications   Signed, Chanetta Marshall, NP 03/27/2015 5:20 PM   I have seen, examined the patient, and reviewed the above assessment and plan.  On exam, iRRR. Changes to above are made where necessary.  Office labs are pending.  Will anticipate initiation of tikosyn with close telemetry monitoring while here.  IF she does not convert to sinus, will need [facility] arranged on Thursday.  She reports compliance with eliquis without interruption.  Co Sign: Thompson Grayer, MD 03/27/2015 5:41 PM

## 2015-03-28 ENCOUNTER — Encounter: Payer: Self-pay | Admitting: Internal Medicine

## 2015-03-28 LAB — BASIC METABOLIC PANEL
Anion gap: 6 (ref 5–15)
BUN: 12 mg/dL (ref 6–20)
CHLORIDE: 104 mmol/L (ref 101–111)
CO2: 33 mmol/L — AB (ref 22–32)
Calcium: 9.3 mg/dL (ref 8.9–10.3)
Creatinine, Ser: 0.62 mg/dL (ref 0.44–1.00)
GFR calc Af Amer: >60 mL/min (ref >60)
GLUCOSE: 100 mg/dL — AB (ref 65–99)
POTASSIUM: 4.4 mmol/L (ref 3.5–5.1)
SODIUM: 143 mmol/L (ref 135–145)

## 2015-03-28 LAB — MAGNESIUM: Magnesium: 2.4 mg/dL (ref 1.7–2.4)

## 2015-03-28 NOTE — Progress Notes (Signed)
Per Benefits check on Tikosyn [facility] @ [facility] # [phone]   TIKOSYN 500 MG BID FOR 30 DAYS   COVER- YES  CO-PAY - $ 8.84 ( more or less )  PRIOR APPROVAL-YES # [phone]  [facility]

## 2015-03-28 NOTE — Telephone Encounter (Signed)
Spoke with pt's husband yesterday.  She was admitted to the hospital.

## 2015-03-28 NOTE — Progress Notes (Signed)
Utilization review completed.  

## 2015-03-28 NOTE — Progress Notes (Signed)
    SUBJECTIVE: The patient is doing well today.  At this time, she denies chest pain, shortness of breath, or any new concerns. Admitted 5/17 for Tikosyn load, converted to SR after 1 dose.   CURRENT MEDICATIONS: . apixaban  5 mg Oral BID  . beta carotene w/minerals  1 tablet Oral Daily  . cholecalciferol  1,000 Units Oral Daily  . diltiazem  120 mg Oral Daily  . dofetilide  500 mcg Oral BID  . entecavir  1 mg Oral Daily  . magnesium oxide  400 mg Oral QHS  . potassium chloride  20 mEq Oral Daily  . sodium chloride  3 mL Intravenous Q12H  . vitamin C  500 mg Oral Daily      OBJECTIVE: Physical Exam: Filed Vitals:   03/27/15 1557 03/27/15 2127 03/28/15 0441  BP: 109/73 108/65 108/93  Pulse: 80 72 65  Temp: 99.4 F (37.4 C) 97.8 F (36.6 C) 97.5 F (36.4 C)  TempSrc: Oral Oral Oral  Resp: 18 18 18   Height: 5\' 3"  (1.6 m)    SpO2: 100% 99% 100%    Intake/Output Summary (Last 24 hours) at 03/28/15 0936 Last data filed at 03/28/15 0900  Gross per 24 hour  Intake    240 ml  Output      1 ml  Net    239 ml    Telemetry reveals atrial fibrillation with conversion to sinus rhythm  GEN- The patient is well appearing, alert and oriented x 3 today.   Head- normocephalic, atraumatic Eyes-  Sclera clear, conjunctiva pink Ears- hearing intact Oropharynx- clear Neck- supple, no JVP Lymph- no cervical lymphadenopathy Lungs- Clear to ausculation bilaterally, normal work of breathing Heart- Regular rate and rhythm, no murmurs, rubs or gallops  GI- soft, NT, ND, + BS Extremities- no clubbing, cyanosis, or edema Skin- no rash or lesion Psych- euthymic mood, full affect Neuro- strength and sensation are intact  LABS: Basic Metabolic Panel:  Recent Labs  03/27/15 1651 03/27/15 1959 03/28/15 0418  NA 138  --  143  K 3.8 4.1 4.4  CL 101  --  104  CO2 28  --  33*  GLUCOSE 128*  --  100*  BUN 18  --  12  CREATININE 0.49  --  0.62  CALCIUM 9.2  --  9.3  MG 2.3  --   2.4    ASSESSMENT AND PLAN:  Active Problems:   Persistent atrial fibrillation  1.  Persistent atrial fibrillation Converted to SR after 1st dose of tikosyn last night Continue Tikosyn 568mcg twice daily QTc 44msec this morning K, Mg stable Continue Eliquis for CHADS2VASC of 2  2.  Hepatitis B Continue home medications  Anticipate discharge on Friday  Chanetta Marshall, NP 03/28/2015 9:38 AM  As above  Continue current olan

## 2015-03-28 NOTE — Progress Notes (Addendum)
EKG 3 hours post tikosyn showed a flutter, QTc of 456, HR 71.  EKGs not loading to EPIC.  Available in pt chart.  Claudette Stapler, RN

## 2015-03-28 NOTE — Progress Notes (Signed)
Notified by CCMD pt currently in NSR.  EKG done to confirm.  Claudette Stapler, RN

## 2015-03-28 NOTE — Care Management Note (Signed)
Case Management Note  Patient Details  Name: Connie Chase MRN: [id number] Date of Birth: [birth year]  Subjective/Objective:     Pt admitted with afib- for Tikosyn load              Action/Plan: PTA pt lived at home with spouse- NCM referral for Tikosyn needs- benefits check started  Expected Discharge Date:                  Expected Discharge Plan:  Home/Self Care  In-House Referral:     Discharge planning Services  CM Consult, Medication Assistance  Post Acute Care Choice:    Choice offered to:     DME Arranged:    DME Agency:     HH Arranged:    HH Agency:     Status of Service:  In process, will continue to follow  Medicare Important Message Given:    Date Medicare IM Given:    Medicare IM give by:    Date Additional Medicare IM Given:    Additional Medicare Important Message give by:     If discussed at [place] of Stay Meetings, dates discussed:    Additional Comments: [facility] @ [facility] # [phone]   TIKOSYN 500 MG BID FOR 30 DAYS   COVER- YES  CO-PAY - $ 8.84 ( more or less )  PRIOR APPROVAL-YES # [phone]  [facility]   Spoke with pt and spouse at bedside- above info shared regarding copay cost- [facility] [address]- call made to pharmacy- who states that they are on program and can order Tikosyn once script received - pt will need 7 day supply on discharge- from [place] and 30 day refillable script to take to her pharmacy- MD please write scripts for both 7 day and 30 day supplies. NCM to continue to follow  Dahlia Client Romeo Rabon, RN 03/28/2015, 2:43 PM

## 2015-03-29 LAB — BASIC METABOLIC PANEL
Anion gap: 6 (ref 5–15)
BUN: 12 mg/dL (ref 6–20)
CO2: 30 mmol/L (ref 22–32)
Calcium: 8.8 mg/dL — ABNORMAL LOW (ref 8.9–10.3)
Chloride: 106 mmol/L (ref 101–111)
Creatinine, Ser: 0.6 mg/dL (ref 0.44–1.00)
GFR calc Af Amer: >60 mL/min (ref >60)
Glucose, Bld: 91 mg/dL (ref 65–99)
Potassium: 4.7 mmol/L (ref 3.5–5.1)
Sodium: 142 mmol/L (ref 135–145)

## 2015-03-29 LAB — MAGNESIUM: MAGNESIUM: 2.5 mg/dL — AB (ref 1.7–2.4)

## 2015-03-29 NOTE — Progress Notes (Signed)
    SUBJECTIVE: The patient is doing well today.  At this time, she denies chest pain, shortness of breath, she has palpitations and multiple questions  Admitted 5/17 for Tikosyn load, converted to SR after 1 dose.   CURRENT MEDICATIONS: . apixaban  5 mg Oral BID  . beta carotene w/minerals  1 tablet Oral Daily  . cholecalciferol  1,000 Units Oral Daily  . diltiazem  120 mg Oral Daily  . dofetilide  500 mcg Oral BID  . entecavir  1 mg Oral Daily  . magnesium oxide  400 mg Oral QHS  . potassium chloride  20 mEq Oral Daily  . sodium chloride  3 mL Intravenous Q12H  . vitamin C  500 mg Oral Daily      OBJECTIVE: Physical Exam: Filed Vitals:   03/28/15 1455 03/28/15 2011 03/28/15 2043 03/29/15 0459  BP: 112/76 128/78 120/69 107/63  Pulse: 73 72  66  Temp: 97.8 F (36.6 C)  98.4 F (36.9 C) 97.7 F (36.5 C)  TempSrc: Oral  Oral Oral  Resp: 18 18  18   Height:      SpO2: 100% 100%  99%    Intake/Output Summary (Last 24 hours) at 03/29/15 1030 Last data filed at 03/29/15 0700  Gross per 24 hour  Intake    240 ml  Output      6 ml  Net    234 ml    Telemetry reveals atrial fibrillation with conversion to sinus rhythm  GEN- The patient is well appearing, alert and oriented x 3 today.   HEENT normal Neck- supple, no JVP Lungs- Clear to ausculation bilaterally, normal work of breathing Heart- some iregularity but mostly  Regular rate and rhythm, no murmurs, rubs or gallops  GI- soft, NT, ND, + BS Extremities- no clubbing, cyanosis, or edema Skin- no rash or lesion Psych- euthymic mood, full affect Neuro- strength and sensation are intact  LABS: Basic Metabolic Panel:  Recent Labs  03/28/15 0418 03/29/15 0503  NA 143 142  K 4.4 4.7  CL 104 106  CO2 33* 30  GLUCOSE 100* 91  BUN 12 12  CREATININE 0.62 0.60  CALCIUM 9.3 8.8*  MG 2.4 2.5*   ECG  QTc 483  ASSESSMENT AND PLAN:  Active Problems:   Persistent atrial fibrillation  1.  Persistent atrial  fibrillation Converted to SR after 1st dose of tikosyn last night Continue Tikosyn 538mcg twice daily K, Mg stable Continue Eliquis for CHADS2VASC of 2 Will contnue low dose dilt as she is having frequent PACs  2.  Hepatitis B Continue home medications  Anticipate discharge on Friday  Chanetta Marshall, NP 03/29/2015 10:30 AM

## 2015-03-29 NOTE — Progress Notes (Signed)
03/29/2015 10:13 AM Nursing note Verbal order Dr. Caryl Comes do not administer AM dose Cardizem. MD to d/c medication. Order enacted. Pt. Updated on plan of care. Will continue to monitor patient.  Raiden Yearwood, Arville Lime

## 2015-03-30 ENCOUNTER — Inpatient Hospital Stay (HOSPITAL_COMMUNITY): Payer: BLUE CROSS/BLUE SHIELD | Admitting: Anesthesiology

## 2015-03-30 ENCOUNTER — Encounter (HOSPITAL_COMMUNITY)
Admission: AD | Disposition: A | Discharge status: Home / Self Care | Payer: Self-pay | Source: Ambulatory Visit | Attending: Internal Medicine

## 2015-03-30 ENCOUNTER — Encounter (HOSPITAL_COMMUNITY): Payer: Self-pay | Admitting: *Deleted

## 2015-03-30 HISTORY — PX: CARDIOVERSION: SHX1299

## 2015-03-30 LAB — BASIC METABOLIC PANEL
Anion gap: 6 (ref 5–15)
BUN: 12 mg/dL (ref 6–20)
CHLORIDE: 107 mmol/L (ref 101–111)
CO2: 30 mmol/L (ref 22–32)
Calcium: 9.5 mg/dL (ref 8.9–10.3)
Creatinine, Ser: 0.67 mg/dL (ref 0.44–1.00)
GFR calc Af Amer: >60 mL/min (ref >60)
GFR calc non Af Amer: >60 mL/min (ref >60)
GLUCOSE: 98 mg/dL (ref 65–99)
POTASSIUM: 4.7 mmol/L (ref 3.5–5.1)
Sodium: 143 mmol/L (ref 135–145)

## 2015-03-30 LAB — MAGNESIUM: MAGNESIUM: 2.4 mg/dL (ref 1.7–2.4)

## 2015-03-30 SURGERY — CARDIOVERSION
Anesthesia: Monitor Anesthesia Care

## 2015-03-30 SURGERY — CARDIOVERSION (CATH LAB)
Anesthesia: Monitor Anesthesia Care

## 2015-03-30 SURGERY — CARDIOVERSION
Anesthesia: General

## 2015-03-30 MED ORDER — MAGNESIUM OXIDE 400 (241.3 MG) MG PO TABS: 400.0000 mg | ORAL_TABLET | Freq: Every day | ORAL | Status: DC

## 2015-03-30 MED ORDER — PROPOFOL 10 MG/ML IV BOLUS
INTRAVENOUS | Status: DC | PRN
  Administered 2015-03-30: 70 mg via INTRAVENOUS

## 2015-03-30 MED ORDER — LIDOCAINE HCL (CARDIAC) 20 MG/ML IV SOLN
INTRAVENOUS | Status: DC | PRN
  Administered 2015-03-30: 60 mg via INTRAVENOUS

## 2015-03-30 MED ORDER — POTASSIUM CHLORIDE CRYS ER 20 MEQ PO TBCR: 20.0000 meq | EXTENDED_RELEASE_TABLET | Freq: Every day | ORAL | Status: DC

## 2015-03-30 MED ORDER — DOFETILIDE 500 MCG PO CAPS: 500.0000 ug | ORAL_CAPSULE | Freq: Two times a day (BID) | ORAL | Status: DC

## 2015-03-30 NOTE — Anesthesia Preprocedure Evaluation (Addendum)
Anesthesia Evaluation  Patient identified by MRN, date of birth, ID band Patient awake    Reviewed: Allergy & Precautions, NPO status , Patient's Chart, lab work & pertinent test results  Airway Mallampati: II  TM Distance: >3 FB Neck ROM: Full    Dental   Pulmonary neg pulmonary ROS,  breath sounds clear to auscultation        Cardiovascular + dysrhythmias Atrial Fibrillation Rhythm:Irregular Rate:Normal  EF 50-55%   Neuro/Psych negative neurological ROS     GI/Hepatic negative GI ROS, (+) Hepatitis -, B  Endo/Other  negative endocrine ROS  Renal/GU negative Renal ROS     Musculoskeletal  (+) Arthritis -, Osteoarthritis,    Abdominal   Peds  Hematology negative hematology ROS (+)   Anesthesia Other Findings   Reproductive/Obstetrics                            Anesthesia Physical Anesthesia Plan  ASA: II  Anesthesia Plan: General   Post-op Pain Management:    Induction: Intravenous  Airway Management Planned: Mask  Additional Equipment:   Intra-op Plan:   Post-operative Plan:   Informed Consent: I have reviewed the patients History and Physical, chart, labs and discussed the procedure including the risks, benefits and alternatives for the proposed anesthesia with the patient or authorized representative who has indicated his/her understanding and acceptance.   Dental advisory given  Plan Discussed with: CRNA  Anesthesia Plan Comments:        Anesthesia Quick Evaluation

## 2015-03-30 NOTE — Progress Notes (Signed)
    SUBJECTIVE: The patient is doing well today.  At this time, she denies chest pain, shortness of breath, she has palpitations and multiple questions  Admitted 5/17 for Tikosyn load, converted to SR after 1 dose.  Converted back to AF last night - has had frequent PAC's since being in SR  CURRENT MEDICATIONS: . apixaban  5 mg Oral BID  . beta carotene w/minerals  1 tablet Oral Daily  . cholecalciferol  1,000 Units Oral Daily  . diltiazem  120 mg Oral Daily  . dofetilide  500 mcg Oral BID  . entecavir  1 mg Oral Daily  . magnesium oxide  400 mg Oral QHS  . potassium chloride  20 mEq Oral Daily  . sodium chloride  3 mL Intravenous Q12H  . vitamin C  500 mg Oral Daily      OBJECTIVE: Physical Exam: Filed Vitals:   03/29/15 0459 03/29/15 1350 03/29/15 2124 03/30/15 0430  BP: 107/63 116/66 114/67 100/63  Pulse: 66 62 60 68  Temp: 97.7 F (36.5 C) 98 F (36.7 C) 97.9 F (36.6 C) 97.7 F (36.5 C)  TempSrc: Oral Oral Oral Oral  Resp: 18 18 17 19   Height:      SpO2: 99% 99% 99% 93%    Intake/Output Summary (Last 24 hours) at 03/30/15 2482 Last data filed at 03/30/15 0500  Gross per 24 hour  Intake    240 ml  Output      6 ml  Net    234 ml    Telemetry reveals SR with conversion to AF  GEN- The patient is well appearing, alert and oriented x 3 today.   Well developed and nourished in no acute distress HENT normal Neck supple with JVP-flat Carotids brisk and full without bruits Clear Irregularly irregular rate and rhythm with controlled ventricular response, no murmurs or gallops Abd-soft with active BS without hepatomegaly No Clubbing cyanosis edema Skin-warm and dry Grossly normal sensory and motor function   LABS: Basic Metabolic Panel:  Recent Labs  03/29/15 0503 03/30/15 0335  NA 142 143  K 4.7 4.7  CL 106 107  CO2 30 30  GLUCOSE 91 98  BUN 12 12  CREATININE 0.60 0.67  CALCIUM 8.8* 9.5  MG 2.5* 2.4    ASSESSMENT AND PLAN:  Active  Problems:   Persistent atrial fibrillation  1.  Persistent atrial fibrillation Admitted 5/17 for Tikosyn loading and converted to SR with frequent PAC's after 1st dose She reverted back to atrial fibrillation this morning D/w Dr Caryl Comes - will plan DCCV today (anesthesia does not have time available to schedule - will work on arranging for later today in EP lab) Continue Diltiazem with frequent PAC's in SR Continue Eliquis for CHADS2VASC score of 2  If DCCV able to be arranged today, will watch in hospital until tomorrow and plan DC tomorrow.   Chanetta Marshall, NP 03/30/2015 7:29 AM  As above. Although I would probably prefer to allow her to go home today.

## 2015-03-30 NOTE — Transfer of Care (Signed)
2Immediate Anesthesia Transfer of Care Note  Patient: Connie Chase  Procedure(s) Performed: Procedure(s): CARDIOVERSION (N/A)  Patient Location: Endoscopy Unit  Anesthesia Type:General  Level of Consciousness: awake, alert  and oriented  Airway & Oxygen Therapy: Patient Spontanous Breathing and Patient connected to nasal cannula oxygen  Post-op Assessment: Report given to RN and Post -op Vital signs reviewed and stable  Post vital signs: Reviewed and stable  Last Vitals:  Filed Vitals:   03/30/15 1500  BP: 111/74  Pulse: 111  Temp:   Resp: 30    Complications: No apparent anesthesia complications

## 2015-03-30 NOTE — Anesthesia Postprocedure Evaluation (Signed)
  Anesthesia Post-op Note  Patient: Connie Chase  Procedure(s) Performed: Procedure(s): CARDIOVERSION (N/A)  Patient Location: PACU  Anesthesia Type:General  Level of Consciousness: awake and alert   Airway and Oxygen Therapy: Patient Spontanous Breathing  Post-op Pain: none  Post-op Assessment: Post-op Vital signs reviewed  Post-op Vital Signs: Reviewed  Last Vitals:  Filed Vitals:   03/30/15 1554  BP: 98/65  Pulse: 84  Temp: 36.8 C  Resp: 18    Complications: No apparent anesthesia complications

## 2015-03-30 NOTE — Interval H&P Note (Signed)
History and Physical Interval Note:  03/30/2015 1:35 PM  Connie Chase  has presented today for surgery, with the diagnosis of  afib  The various methods of treatment have been discussed with the patient and family. After consideration of risks, benefits and other options for treatment, the patient has consented to  Procedure(s): CARDIOVERSION (N/A) as a surgical intervention .  [name] history has been reviewed, patient examined, no change in status, stable for surgery.  I have reviewed the patient's chart and labs.  Questions were answered to the patient's satisfaction.     TURNER,TRACI R

## 2015-03-30 NOTE — Discharge Summary (Signed)
ELECTROPHYSIOLOGY PROCEDURE DISCHARGE SUMMARY    Patient ID: Connie Chase,  MRN: [id number], DOB/AGE: 1946-09-06 69 y.o.  Admit date: 03/27/2015 Discharge date: 03/30/2015  Primary Care Physician: Marjorie Smolder, MD Electrophysiologist: Caryl Comes  Primary Discharge Diagnosis:  1.  Persistent atrial fibrillation status post Tikosyn loading this admission  Secondary Discharge Diagnosis:  1.  Chronic hepatitis B  No Known Allergies   Procedures This Admission:  1.  Tikosyn loading 2.  Direct current cardioversion on 03/30/15 by Dr Rayann Heman which successfully restored SR.  There were no early apparent complications.   Brief HPI: Connie Chase is a 69 y.o. female with a past medical history as noted above.  She has had persistent atrial fibrillation with worsening symptoms of fatigue and palpitations.  Risks, benefits, and alternatives to Tikosyn were reviewed with the patient who wished to proceed.    Hospital Course:  The patient was admitted and Tikosyn was initiated. She converted to SR after 1 dose of Tikosyn but had frequent atrial ectopy and recurrence of atrial fibrillation early in the morning on the day planned for discharge.  Renal function and electrolytes were followed during the hospitalization.  Their QTc remained stable.  On 03/30/15 they underwent direct current cardioversion which restored sinus rhythm.  They were monitored until discharge on telemetry which demonstrated sinus rhythm.  On the day of discharge, they were examined by Dr Caryl Comes who considered them stable for discharge to home.  Follow-up has been arranged with Roderic Palau, NP in 1 week and with Dr Caryl Comes in 4 weeks.   Physical Exam: Filed Vitals:   03/30/15 0430 03/30/15 1405 03/30/15 1500 03/30/15 1519  BP: 100/63 93/59 111/74 92/53  Pulse: 68 93 111 63  Temp: 97.7 F (36.5 C) 97.2 F (36.2 C)    TempSrc: Oral Oral    Resp: 19 23 30 18   Height:      SpO2: 93% 97% 97% 97%   Labs:   Lab Results    Component Value Date   WBC 4.5 01/19/2014   HGB 14.5 01/19/2014   HCT 42.8 01/19/2014   MCV 100.1* 01/19/2014   PLT 174.0 01/19/2014     Recent Labs Lab 03/30/15 0335  NA 143  K 4.7  CL 107  CO2 30  BUN 12  CREATININE 0.67  CALCIUM 9.5  GLUCOSE 98     Discharge Medications:    Medication List    STOP taking these medications        Magnesium 250 MG Tabs      TAKE these medications        apixaban 5 MG Tabs tablet  Commonly known as:  ELIQUIS  Take 1 tablet (5 mg total) by mouth 2 (two) times daily.     beta carotene w/minerals tablet  Take 1 tablet by mouth daily.     CENTRUM SILVER PO  Take 1 tablet by mouth daily.     cholecalciferol 1000 UNITS tablet  Commonly known as:  VITAMIN D  Take 1,000 Units by mouth daily.     diltiazem 120 MG 24 hr capsule  Commonly known as:  CARDIZEM CD  Take 1 capsule (120 mg total) by mouth daily.     dofetilide 500 MCG capsule  Commonly known as:  TIKOSYN  Take 1 capsule (500 mcg total) by mouth 2 (two) times daily.     entecavir 1 MG tablet  Commonly known as:  BARACLUDE  Take 1 mg by mouth daily.  magnesium oxide 400 (241.3 MG) MG tablet  Commonly known as:  MAG-OX  Take 1 tablet (400 mg total) by mouth at bedtime.     OMEGA 3 PO  Take 4 tablets by mouth daily. 2 tabs in the am, 1 tab at lunch and 1 tab in the pm.     potassium chloride SA 20 MEQ tablet  Commonly known as:  K-DUR,KLOR-CON  Take 1 tablet (20 mEq total) by mouth daily.     SYSTANE BALANCE OP  Place 1 drop into both eyes 3 (three) times daily.     vitamin C 500 MG tablet  Commonly known as:  ASCORBIC ACID  Take 500 mg by mouth daily.        Disposition:  Discharge Instructions    Diet - low sodium heart healthy    Complete by:  As directed      Increase activity slowly    Complete by:  As directed           Follow-up Information    Follow up with [facility] On 04/06/2015.   Why:  at [facility] for Tikosyn follow up   Contact  information:   [address] [place] [phone]      Follow up with Virl Axe, MD On 05/04/2015.   Specialty:  Cardiology   Why:  at 2:30PM   Contact information:   [address] [address] [phone]       Duration of Discharge Encounter: Greater than 30 minutes including physician time.  Signed, Chanetta Marshall, NP 03/30/2015 3:32 PM

## 2015-03-30 NOTE — Op Note (Signed)
EP procedure Note   Pre procedure Diagnosis:  Persistent Atrial fibrillation Post procedure Diagnosis:  Same  Procedures:  Electrical cardioversion  Description:  Informed, written consent was obtained for cardioversion.  Adequate IV acces and airway support were assured.  The patient was adequately sedated with intravenous propofol as outlined in the anesthesia report.  The patient presented today in atrial fibrillation.  She was successfully cardioverted to sinus rhythm with a single synchronized biphasic 200J shock delivered with cardioversion electrodes placed in the anterior/posterior configuration.  She remains in sinus rhythm thereafter. EBL 14ml. There were no early apparent complications.  Conclusions:  1.  Successful cardioversion of afib to sinus rhythm 2.  No early apparent complications.   Connie Nethery,MD 3:07 PM 03/30/2015

## 2015-03-30 NOTE — Progress Notes (Signed)
Pt converted to Afib with heart rate in 130's to 160's. Pt ambulating to restroom. Pt back in bed ekg confirmed pt converted back to afib rate 90's- 120's vs stable. Pt denies any complications. Monitoring will continue.

## 2015-04-02 ENCOUNTER — Encounter (HOSPITAL_COMMUNITY): Payer: Self-pay | Admitting: Internal Medicine

## 2015-04-02 ENCOUNTER — Telehealth: Payer: Self-pay | Admitting: Internal Medicine

## 2015-04-02 ENCOUNTER — Telehealth: Payer: Self-pay

## 2015-04-02 MED ORDER — DOFETILIDE 250 MCG PO CAPS: 250.0000 ug | ORAL_CAPSULE | Freq: Two times a day (BID) | ORAL | Status: DC

## 2015-04-02 NOTE — Telephone Encounter (Signed)
Pt had a cardioversion on 03/30/15. she was in the hospital from Tuesday to Friday. Since she came home she has been walking in her room only, because she has been very dizzy. Pt feels  she is in SR. The Tikosyn medication is making her dizzy. Pt thinks that the dose is too high. Pt is aware that I will make the MD aware of her Symptoms and concerns. Spoke with Dr. Rayann Heman regarding pt's symptoms: MD recommends for pt to come to the office for an EKG. Dr. Rayann Heman does not recommends for pt to decreased her Tikosyn 500 mg dose, because she may go back into A-fib. Pt   states can't come to the office today, because she is too dizzy to drive, and her husband is not at home now. Pt would like for see what does Dr. Caryl Comes recommends when he comes in this afternoon.

## 2015-04-02 NOTE — Telephone Encounter (Signed)
Approval for Tikosyn 274mcg bid obtained thru [facility]. [id number]. This is good for one year. Local [facility] notified.

## 2015-04-02 NOTE — Telephone Encounter (Signed)
New message      Pt c/o medication issue:  1. Name of Medication: tikosyn 2. How are you currently taking this medication (dosage and times per day)? 500mg  bid 3. Are you having a reaction (difficulty breathing--STAT)? no  4. What is your medication issue? Pt is very very dizzy.  Pt can hardly walk because she is so dizzy. She fell yesterday.  Pt also has a headache.  Please advise

## 2015-04-02 NOTE — Telephone Encounter (Signed)
Dr Caryl Comes aware of pt's symptoms. MD recommended for pt to decreased the Tikosyn dose from 500 mcg to 250 mcg twice a day. Pt is aware of MD's recommendations. [facility] Pt is aware of recommendations. Pt is aware that medication needs   prior-authorization as per pharmacist. Pt's insurance is being called for medication to be authorized. Pt is aware.

## 2015-04-02 NOTE — Telephone Encounter (Signed)
Left pt a message to call back. 

## 2015-04-06 ENCOUNTER — Encounter (HOSPITAL_COMMUNITY): Payer: Self-pay | Admitting: Nurse Practitioner

## 2015-04-06 ENCOUNTER — Ambulatory Visit (HOSPITAL_COMMUNITY)
Admission: RE | Admit: 2015-04-06 | Discharge: 2015-04-06 | Disposition: A | Discharge status: Home / Self Care | Payer: BLUE CROSS/BLUE SHIELD | Source: Ambulatory Visit | Attending: Nurse Practitioner | Admitting: Nurse Practitioner

## 2015-04-06 DIAGNOSIS — I481 Persistent atrial fibrillation: Secondary | ICD-10-CM

## 2015-04-06 DIAGNOSIS — I4819 Other persistent atrial fibrillation: Secondary | ICD-10-CM

## 2015-04-06 LAB — BASIC METABOLIC PANEL
ANION GAP: 5 (ref 5–15)
BUN: 14 mg/dL (ref 6–20)
CO2: 30 mmol/L (ref 22–32)
CREATININE: 0.47 mg/dL (ref 0.44–1.00)
Calcium: 9.3 mg/dL (ref 8.9–10.3)
Chloride: 105 mmol/L (ref 101–111)
GFR calc Af Amer: >60 mL/min (ref >60)
GFR calc non Af Amer: >60 mL/min (ref >60)
Glucose, Bld: 108 mg/dL — ABNORMAL HIGH (ref 65–99)
Potassium: 4.5 mmol/L (ref 3.5–5.1)
SODIUM: 140 mmol/L (ref 135–145)

## 2015-04-06 LAB — MAGNESIUM: Magnesium: 2.4 mg/dL (ref 1.7–2.4)

## 2015-04-06 NOTE — Progress Notes (Signed)
Patient ID: Connie Chase, female   DOB: 1946/01/14, 69 y.o.   MRN: [id number]     Primary Care Physician: Marjorie Smolder, MD Primary Cardiologist:  Primary Electrophysiologist:Dr. Jolyn Nap Referring Physician: [facility] F/U   Connie Chase is a 69 y.o. female with a h/o persistent atrial fibrillation who presents for consultation in the [facility] following admission for loading of Tikosyn.  The patient had worsening symptoms for fatigue and palpitations. She converted to SR after 1 dose of Tikosyn but had frequent atrial ectopy and recurrence of atrial fibrillation early in the morning on the day planned for discharge. Renal function and electrolytes were followed during the hospitalization. Their QTc remained stable. On 03/30/15 they underwent direct current cardioversion which restored sinus rhythm. They were monitored until discharge on telemetry which demonstrated sinus rhythm. On the day of discharge, they were examined by Dr Caryl Comes who considered them stable for discharge to home.   Pt reports that she fell on Sunday after d/c due to dizziness. Tikosyn was decreased to 250 mg bid. She states that the dizziness has improved but is still dizzy, noticed more with standing. Is changing position slowly. She is hesitant to change any other meds for fear that she will go back into afib. BP standing 94/60.  Today, she denies symptoms of palpitations, chest pain, shortness of breath, orthopnea, PND, lower extremity edema,  presyncope, syncope, snoring, daytime somnolence, bleeding, or neurologic sequela. Positive for dizziness. The patient is tolerating medications without difficulties and is otherwise without complaint today.    Atrial Fibrillation Risk Factors:  she does not have symptoms or diagnosis of sleep apnea.   she does have a history of rheumatic fever.  she does not have a history of alcohol use.   LA size: 40 mm   Atrial Fibrillation Management  history:  Previous antiarrhythmic drugs: flecainide  Previous cardioversions: x4  Previous ablations: none  CHADS2VASC score: at least 2  Anticoagulation history:  Pradaxa and now apixaban   Past Medical History  Diagnosis Date  . Persistent atrial fibrillation     a. s/p DCCV b. intolerant of Flecainide  . Hemorrhage anterior chamber eye     On pradaxa  . 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 Hepatitis B infection     Chronic Hep B carrier, on suppressive Tx  . Osteoarthritis          Past Surgical History  Procedure Laterality Date  . Cardioversion  04/02/2012    Procedure: CARDIOVERSION;  Surgeon: Deboraha Sprang, MD;  Location: [facility] OR;  Service: Cardiovascular;  Laterality: N/A;  . Cataract extraction w/ intraocular lens implant Left 03/2011  . Cardioversion N/A 09/12/2013    Procedure: CARDIOVERSION;  Surgeon: Deboraha Sprang, MD;  Location: [place];  Service: Cardiovascular;  Laterality: N/A;  . Cardioversion N/A 01/26/2014    Procedure: CARDIOVERSION;  Surgeon: Deboraha Sprang, MD;  Location: [facility] ENDOSCOPY;  Service: Cardiovascular;  Laterality: N/A;  . Cataract extraction w/ intraocular lens implant Right 2013  . Cardioversion N/A 03/30/2015    Procedure: CARDIOVERSION;  Surgeon: Thompson Grayer, MD;  Location: [facility] ENDOSCOPY;  Service: Cardiovascular;  Laterality: N/A;    Current Outpatient Prescriptions  Medication Sig Dispense Refill  . apixaban (ELIQUIS) 5 MG TABS tablet Take 1 tablet (5 mg total) by mouth 2 (two) times daily. 180 tablet 1  . beta carotene w/minerals (OCUVITE) tablet Take 1 tablet by mouth daily.     . cholecalciferol (VITAMIN D) 1000  UNITS tablet Take 1,000 Units by mouth daily.    Marland Kitchen diltiazem (CARDIZEM CD) 120 MG 24 hr capsule Take 1 capsule (120 mg total) by mouth daily. 30 capsule 6  . dofetilide (TIKOSYN) 250 MCG capsule Take 1 capsule (250 mcg total) by mouth 2 (two) times daily. 180 capsule 3  . entecavir (BARACLUDE) 1 MG tablet Take 1 mg by  mouth daily.    . magnesium oxide (MAG-OX) 400 (241.3 MG) MG tablet Take 1 tablet (400 mg total) by mouth at bedtime. 30 tablet 1  . Multiple Vitamins-Minerals (CENTRUM SILVER PO) Take 1 tablet by mouth daily.     . Omega-3 Fatty Acids (OMEGA 3 PO) Take 4 tablets by mouth daily. 2 tabs in the am, 1 tab at lunch and 1 tab in the pm.    . potassium chloride SA (K-DUR,KLOR-CON) 20 MEQ tablet Take 1 tablet (20 mEq total) by mouth daily. 30 tablet 1  . Propylene Glycol (SYSTANE BALANCE OP) Place 1 drop into both eyes 3 (three) times daily.    . vitamin C (ASCORBIC ACID) 500 MG tablet Take 500 mg by mouth daily.     No current facility-administered medications for this encounter.    No Known Allergies  History   Social History  . Marital Status: Married    Spouse Name: N/A  . Number of Children: N/A  . Years of Education: N/A   Occupational History  . Not on file.   Social History Main Topics  . Smoking status: Never Smoker   . Smokeless tobacco: Never Used  . Alcohol Use: No  . Drug Use: No  . Sexual Activity: No     Comment: husband had vasectomy    Other Topics Concern  . Not on file   Social History Narrative   Regular exercise    Family History  Problem Relation Age of Onset  . Other      Neg cardiac family Hx   The patient does not have a history of early familial atrial fibrillation or other arrhythmias.  ROS- All systems are reviewed and negative except as per the HPI above.  Physical Exam: There were no vitals filed for this visit.  GEN- The patient is well appearing, alert and oriented x 3 today.   Head- normocephalic, atraumatic Eyes-  Sclera clear, conjunctiva pink Ears- hearing intact Oropharynx- clear Neck- supple, no JVP Lymph- no cervical lymphadenopathy Lungs- Clear to ausculation bilaterally, normal work of breathing Heart- Regular rate and rhythm, no murmurs, rubs or gallops, PMI not laterally displaced GI- soft, NT, ND, + BS Extremities- no  clubbing, cyanosis, or edema MS- no significant deformity or atrophy Skin- no rash or lesion Psych- euthymic mood, full affect Neuro- strength and sensation are intact  EKG Sinus rhythm at 73 bpm, PR int 152 ms, QRS 80 ms, QTc 444 ms Echo 4/16 normal LV function, mild-mod L atrium enlargement at 40 mm  Epic records are reviewed at length today Labs 5/20 MAG 2.4/Kt 4.5/ Creat 0.47  Assessment and Plan:  1. Atrial fibrillation The patient has Symptomatic paroxysmal/ persistent atrial fibrillation.  The patients CHAD2VASC score is at least 2..  she is  appropriately anticoagulated at this time. The patient is adequately rate controlled with cardizem. Antiarrhythmic therapy to dates has included  Flecainide/ tikosyn  The patients left atrial size is 40 mm.  Additional echo findings include normal LV function  Treatment Plan:  1. Persistent afib Maintaining SR since initiation of Tikosyn Dizziness with fall  since d/c form hospital with improvement of dizziness since decrease in tikosyn to 250 mg bid.  BP shows orthostatic changes with standing at 94/60, consuming enough water. Suggested to stop dilt but she is afraid that afib may return Will sent note to Dr. Caryl Comes to make aware. Continue [place] Bmet/mag today  F/u with Dr. Caryl Comes as scheduled 6/24        Roderic Palau NP  Nurse Practitioner, [facility] 04/06/2015 9:00 AM

## 2015-04-06 NOTE — Patient Instructions (Signed)
Call if any issues or dizziness gets worse [phone]

## 2015-05-04 ENCOUNTER — Encounter: Payer: Self-pay | Admitting: Internal Medicine

## 2015-05-04 ENCOUNTER — Ambulatory Visit (INDEPENDENT_AMBULATORY_CARE_PROVIDER_SITE_OTHER): Payer: BLUE CROSS/BLUE SHIELD | Admitting: Internal Medicine

## 2015-05-04 VITALS — BP 110/70 | HR 88 | Ht 63.0 in | Wt 100.0 lb

## 2015-05-04 DIAGNOSIS — I4819 Other persistent atrial fibrillation: Secondary | ICD-10-CM

## 2015-05-04 DIAGNOSIS — I481 Persistent atrial fibrillation: Secondary | ICD-10-CM | POA: Diagnosis not present

## 2015-05-04 NOTE — Progress Notes (Signed)
Patient Care Team: Darcus Austin, MD as PCP - General (Family Medicine)   HPI  Connie Chase is a 69 y.o. female Seen in followup for recent cardioversion that was done without antiarrhythmic support.  She is not well tolerated flecainide because of dizziness. When seen in the fall, she was unaware for atrial fibrillation and we decided transiently to pursue rate control.  Ultimately because of problems with palpitations and fatigue, she was admitted for dofetilide loading. She converted initially on the first dose and then subsequently developed recurrent atrial fibrillation. We cardioverted her at that time and she was seen by DC about one week later and was holding sinus rhythm. She had had interval lightheadedness associated with modest dizziness. This has prompted a decrease in her dofetilide dose presumed to be the cause. However, measurements of her blood pressure socially demonstrated hypotension with  systolic of 94.  She was able to tell that she was in regular rhythm yesterday and in atrial fibrillation today. Felt considerably better yesterday than today.   Her last echocardiogram  4/16 demonstrated normal LV function  With left atrial dimensions suggesting mild-moderate enlargement at 40 /2.8 /40 --  Qualitative description was of  Left atrial dimension being mild  Past Medical History  Diagnosis Date  . Persistent atrial fibrillation     a. s/p DCCV b. intolerant of Flecainide  . Hemorrhage anterior chamber eye     On pradaxa  . 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 Hepatitis B infection     Chronic Hep B carrier, on suppressive Tx  . Osteoarthritis           Past Surgical History  Procedure Laterality Date  . Cardioversion  04/02/2012    Procedure: CARDIOVERSION;  Surgeon: Deboraha Sprang, MD;  Location: [facility] OR;  Service: Cardiovascular;  Laterality: N/A;  . Cataract extraction w/ intraocular lens implant Left 03/2011  . Cardioversion N/A 09/12/2013    Procedure:  CARDIOVERSION;  Surgeon: Deboraha Sprang, MD;  Location: [place];  Service: Cardiovascular;  Laterality: N/A;  . Cardioversion N/A 01/26/2014    Procedure: CARDIOVERSION;  Surgeon: Deboraha Sprang, MD;  Location: [facility] ENDOSCOPY;  Service: Cardiovascular;  Laterality: N/A;  . Cataract extraction w/ intraocular lens implant Right 2013  . Cardioversion N/A 03/30/2015    Procedure: CARDIOVERSION;  Surgeon: Thompson Grayer, MD;  Location: [facility] ENDOSCOPY;  Service: Cardiovascular;  Laterality: N/A;    Current Outpatient Prescriptions  Medication Sig Dispense Refill  . apixaban (ELIQUIS) 5 MG TABS tablet Take 1 tablet (5 mg total) by mouth 2 (two) times daily. 180 tablet 1  . beta carotene w/minerals (OCUVITE) tablet Take 1 tablet by mouth daily.     . cholecalciferol (VITAMIN D) 1000 UNITS tablet Take 1,000 Units by mouth daily.    Marland Kitchen diltiazem (CARDIZEM CD) 120 MG 24 hr capsule Take 1 capsule (120 mg total) by mouth daily. 30 capsule 6  . dofetilide (TIKOSYN) 250 MCG capsule Take 1 capsule (250 mcg total) by mouth 2 (two) times daily. 180 capsule 3  . dorzolamide-timolol (COSOPT) 22.3-6.8 MG/ML ophthalmic solution Place 1 drop into the right eye 2 (two) times daily.    Marland Kitchen entecavir (BARACLUDE) 1 MG tablet Take 1 mg by mouth daily.    . magnesium oxide (MAG-OX) 400 (241.3 MG) MG tablet Take 1 tablet (400 mg total) by mouth at bedtime. 30 tablet 1  . Multiple Vitamins-Minerals (CENTRUM SILVER PO) Take 1 tablet by mouth daily.     Marland Kitchen  Omega-3 Fatty Acids (OMEGA 3 PO) Take 4 tablets by mouth daily. 2 tabs in the am, 1 tab at lunch and 1 tab in the pm.    . potassium chloride SA (K-DUR,KLOR-CON) 20 MEQ tablet Take 1 tablet (20 mEq total) by mouth daily. 30 tablet 1  . vitamin C (ASCORBIC ACID) 500 MG tablet Take 500 mg by mouth daily.     No current facility-administered medications for this visit.    No Known Allergies  Review of Systems negative except from HPI and PMH  Physical Exam BP 110/70 mmHg   Pulse 88  Ht 5\' 3"  (1.6 m)  Wt 100 lb (45.36 kg)  BMI 17.72 kg/m2 Well developed and well nourished in no acute distress HENT normal E scleral and icterus clear Neck Supple JVP Clear to ausculation irregularly irregular   no murmurs gallops or rub Soft with active bowel sounds No clubbing cyanosis  Edema Alert and oriented, grossly normal motor and sensory function Skin Warm and Dry  ECG demonstrates atrial fibrillation at 87 intervals-/80/34. Assessment and  Plan  Atrial fibrillation  Glaucoma  She has recurrent atrial fibrillation. She has been going in and out on the dofetilide and we have decided to continue it as there seems to be clear symptomatic benefit when she is holding sinus rhythm. Higher doses of clearly in her mind and associated with dizziness.  Her rapid rates require adjunctive AV nodal blocking agents but we will try and use them more as needed.  I've encouraged her to pursue an appointment with Dr. Adrian Prows to consider catheter ablation of her atrial fibrillation. She has been hesitant but she says she is willing to pursue that\  We spent more than 50% of our >45 min visit in face to face counseling regarding the above

## 2015-05-04 NOTE — Patient Instructions (Signed)
Medication Instructions:  Your physician recommends that you continue on your current medications as directed. Please refer to the Current Medication list given to you today.  Labwork: None ordered  Testing/Procedures: None ordered  Follow-Up: You have been referred to Dr. Ola Spurr in [place] physician recommends that you schedule a follow-up appointment in: 3 months with Roderic Palau, NP.  Thank you for choosing [facility]!!

## 2015-05-07 ENCOUNTER — Other Ambulatory Visit: Payer: Self-pay

## 2015-05-08 ENCOUNTER — Telehealth: Payer: Self-pay | Admitting: Nurse Practitioner

## 2015-05-21 ENCOUNTER — Other Ambulatory Visit: Payer: Self-pay | Admitting: Internal Medicine

## 2015-06-11 HISTORY — PX: CARDIAC ELECTROPHYSIOLOGY MAPPING AND ABLATION: SHX1292

## 2015-06-12 ENCOUNTER — Other Ambulatory Visit: Payer: Medicare Other

## 2015-06-12 ENCOUNTER — Other Ambulatory Visit: Payer: BLUE CROSS/BLUE SHIELD

## 2015-06-12 ENCOUNTER — Ambulatory Visit
Admission: RE | Admit: 2015-06-12 | Discharge: 2015-06-12 | Disposition: A | Discharge status: Home / Self Care | Payer: BLUE CROSS/BLUE SHIELD | Source: Ambulatory Visit | Attending: Gastroenterology | Admitting: Gastroenterology

## 2015-06-12 DIAGNOSIS — B181 Chronic viral hepatitis B without delta-agent: Secondary | ICD-10-CM

## 2015-06-14 ENCOUNTER — Other Ambulatory Visit: Payer: Self-pay | Admitting: *Deleted

## 2015-06-14 MED ORDER — DILTIAZEM HCL ER COATED BEADS 120 MG PO CP24: 120.0000 mg | ORAL_CAPSULE | Freq: Every day | ORAL | Status: DC

## 2015-06-28 ENCOUNTER — Telehealth: Payer: Self-pay | Admitting: Internal Medicine

## 2015-06-28 NOTE — Telephone Encounter (Signed)
New message     Pt wanted Dr. Caryl Comes to know that she is scheduled for ablation surgery on August 25

## 2015-06-28 NOTE — Progress Notes (Signed)
Call Documentation      Suan Halter at 06/28/2015 10:22 AM     Status: Signed       Expand All Collapse All   New message  Pt wanted Dr. Caryl Comes to know that she is scheduled for ablation surgery on August 25

## 2015-07-13 ENCOUNTER — Encounter: Payer: Self-pay | Admitting: Internal Medicine

## 2015-07-14 ENCOUNTER — Encounter: Payer: Self-pay | Admitting: Internal Medicine

## 2015-07-17 ENCOUNTER — Ambulatory Visit (HOSPITAL_COMMUNITY): Payer: BLUE CROSS/BLUE SHIELD | Admitting: Nurse Practitioner

## 2015-07-26 ENCOUNTER — Other Ambulatory Visit: Payer: Self-pay | Admitting: Gastroenterology

## 2015-07-26 DIAGNOSIS — B181 Chronic viral hepatitis B without delta-agent: Secondary | ICD-10-CM

## 2015-07-27 ENCOUNTER — Inpatient Hospital Stay (HOSPITAL_COMMUNITY)
Admission: RE | Admit: 2015-07-27 | Payer: BLUE CROSS/BLUE SHIELD | Source: Ambulatory Visit | Admitting: Nurse Practitioner

## 2015-08-08 ENCOUNTER — Encounter: Payer: Self-pay | Admitting: Obstetrics and Gynecology

## 2015-08-08 ENCOUNTER — Ambulatory Visit (INDEPENDENT_AMBULATORY_CARE_PROVIDER_SITE_OTHER): Payer: BLUE CROSS/BLUE SHIELD | Admitting: Obstetrics and Gynecology

## 2015-08-08 VITALS — BP 100/68 | HR 70 | Resp 14 | Ht 62.5 in | Wt 99.2 lb

## 2015-08-08 DIAGNOSIS — Z01419 Encounter for gynecological examination (general) (routine) without abnormal findings: Secondary | ICD-10-CM | POA: Diagnosis not present

## 2015-08-08 DIAGNOSIS — N950 Postmenopausal bleeding: Secondary | ICD-10-CM

## 2015-08-08 NOTE — Progress Notes (Signed)
Patient ID: Connie Chase, female   DOB: 01-03-1946, 69 y.o.   MRN: [id number] 69 y.o. G2P2 Married [place] female here for annual exam.    Had a cardioversion for atrial fibrillation, and this was not successful.  Had cardiac ablation on August 25.   Aborted.  Will do radiofrequency ablation in October.  Having some pinking spotting in her underwear.  Had this last week.  No pain.  Has this in past as well. Patient is on Eliquis blood thinner.  PCP:  Darcus Austin, MD   Patient's last menstrual period was 11/10/2006 (approximate).          Sexually active: No. female partner The current method of family planning is vasectomy.    Exercising: Yes.    stationary bike 50 minutes/day. Smoker:  no  Health Maintenance: Pap:  07-05-14 Neg:Neg HR HPV History of abnormal Pap:  no MMG:  07-13-14 Density Cat.D/3mm reniform focal asymmetry in Rt.breast/left breast neg.--further views of Rt.breast on 07-21-14 do not reproduce the area in questions and presumably represents superimposed breast tissue.  Benign/BiRads 2:[facility] (Patient states doesn't have mammogram every year) Colonoscopy:  2002 normal with [facility]. BMD:   07-13-14  Result  Osteopenia:[facility].  Right hip T score - 2.1.  TDaP:  2008 Screening Labs:  Hb today: PCP, Urine today: PCP   reports that she has never smoked. She has never used smokeless tobacco. She reports that she does not drink alcohol or use illicit drugs.  Past Medical History  Diagnosis Date  . Persistent atrial fibrillation     a. s/p DCCV b. intolerant of Flecainide  . Hemorrhage anterior chamber eye     On pradaxa  . 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 Hepatitis B infection     Chronic Hep B carrier, on suppressive Tx  . Osteoarthritis           Past Surgical History  Procedure Laterality Date  . Cardioversion  04/02/2012    Procedure: CARDIOVERSION;  Surgeon: Deboraha Sprang, MD;  Location: [facility] OR;  Service: Cardiovascular;  Laterality: N/A;  . Cataract extraction  w/ intraocular lens implant Left 03/2011  . Cardioversion N/A 09/12/2013    Procedure: CARDIOVERSION;  Surgeon: Deboraha Sprang, MD;  Location: [place];  Service: Cardiovascular;  Laterality: N/A;  . Cardioversion N/A 01/26/2014    Procedure: CARDIOVERSION;  Surgeon: Deboraha Sprang, MD;  Location: [facility] ENDOSCOPY;  Service: Cardiovascular;  Laterality: N/A;  . Cataract extraction w/ intraocular lens implant Right 2013  . Cardioversion N/A 03/30/2015    Procedure: CARDIOVERSION;  Surgeon: Thompson Grayer, MD;  Location: [facility] ENDOSCOPY;  Service: Cardiovascular;  Laterality: N/A;    Current Outpatient Prescriptions  Medication Sig Dispense Refill  . apixaban (ELIQUIS) 5 MG TABS tablet Take 2.5 mg by mouth 2 (two) times daily.    . beta carotene w/minerals (OCUVITE) tablet Take 1 tablet by mouth daily.     . cholecalciferol (VITAMIN D) 1000 UNITS tablet Take 1,000 Units by mouth daily.    Marland Kitchen diltiazem (CARDIZEM CD) 120 MG 24 hr capsule Take 1 capsule (120 mg total) by mouth daily. 90 capsule 1  . dofetilide (TIKOSYN) 250 MCG capsule Take 1 capsule (250 mcg total) by mouth 2 (two) times daily. 180 capsule 3  . dorzolamide (TRUSOPT) 2 % ophthalmic solution Place 1 drop into the right eye daily.    Marland Kitchen entecavir (BARACLUDE) 1 MG tablet Take 1 mg by mouth daily.    Marland Kitchen KLOR-CON M20 20  MEQ tablet TAKE 1 TABLET BY MOUTH EVERY DAY 30 tablet 6  . magnesium oxide (MAG-OX) 400 MG tablet Take 400 mg by mouth at bedtime.    . Multiple Vitamins-Minerals (CENTRUM SILVER PO) Take 1 tablet by mouth daily.     . Omega-3 Fatty Acids (OMEGA 3 PO) Take 4 tablets by mouth daily. 2 tabs in the am, 1 tab at lunch and 1 tab in the pm.    . vitamin C (ASCORBIC ACID) 500 MG tablet Take 500 mg by mouth daily.     No current facility-administered medications for this visit.    Family History  Problem Relation Age of Onset  . Other      Neg cardiac family Hx    ROS:  Pertinent items are noted in HPI.  Otherwise, a  comprehensive ROS was negative.  Exam:   BP 100/68 mmHg  Pulse 70  Resp 14  Ht 5' 2.5" (1.588 m)  Wt 99 lb 3.2 oz (44.997 kg)  BMI 17.84 kg/m2  LMP 11/10/2006 (Approximate)    General appearance: alert, cooperative and appears stated age Head: Normocephalic, without obvious abnormality, atraumatic Neck: no adenopathy, supple, symmetrical, trachea midline and thyroid normal to inspection and palpation Lungs: clear to auscultation bilaterally Breasts: normal appearance, no masses or tenderness, Inspection negative, No nipple retraction or dimpling, No nipple discharge or bleeding, No axillary or supraclavicular adenopathy Heart: regular rate and rhythm Abdomen: soft, non-tender; bowel sounds normal; no masses,  no organomegaly Extremities: extremities normal, atraumatic, no cyanosis or edema Skin: Skin color, texture, turgor normal. No rashes or lesions Lymph nodes: Cervical, supraclavicular, and axillary nodes normal. No abnormal inguinal nodes palpated Neurologic: Grossly normal  Pelvic: External genitalia:  no lesions              Urethra:  normal appearing urethra with no masses, tenderness or lesions              Bartholins and Skenes: normal                 Vagina: normal appearing vagina with normal color and discharge, no lesions              Cervix: no lesions              Pap taken:  Yes. Bimanual Exam:  Uterus:  normal size, contour, position, consistency, mobility, non-tender              Adnexa: normal adnexa and no mass, fullness, tenderness              Rectovaginal: Yes.  .  Confirms.              Anus:  normal sphincter tone, no lesions  Chaperone was present for exam.  Assessment:   Well woman visit visit. Postmenopausal bleeding.  Atrial fibrillation.  On Eliquis blood thinner. Chronic hep B carrier.  Plan: Yearly mammogram recommended after age 69.  Declines and states she will do every other year. Recommended self breast exam.  Pap and HR HPV as  above. Discussed Calcium, Vitamin D, regular exercise program including cardiovascular and weight bearing exercise. Labs performed.  No..    Refills given on medications.  No..    Return for pelvic ultrasound.  Discussed the importance of this study as part of an evaluation to rule out uterine and ovarian pathology, including malignancy.  Patient indicates understanding. If needs EMB, this will need to be done after coming off blood thinner. Bone  density in 2017.  Follow up annually and prn.      After visit summary provided.

## 2015-08-08 NOTE — Patient Instructions (Signed)

## 2015-08-09 ENCOUNTER — Telehealth: Payer: Self-pay | Admitting: Obstetrics and Gynecology

## 2015-08-09 LAB — IPS PAP SMEAR ONLY

## 2015-08-09 NOTE — Telephone Encounter (Signed)
Patient calling requesting to schedule her ultrasound. She wanted to be sure I took a message so we can get back with her.

## 2015-08-17 NOTE — Telephone Encounter (Signed)
Spoke with patient and reviewed benefit for PUS. Patient understands and agreeable. Patient requested appointment on 09/27/15. Reason for PUS is postmenopausal bleeding. Reviewed with Gay Filler. Not recommended patient wait 5 weeks at this time. Reviewed with patient. Patient stated she is having an ablation at [facility] that would put her 'in the bed for a while' for recovery as well as other appointments/engagements that prevent an appointment sooner than requested. Patient insisted multiple times to have an appointment on 09/27/15 early morning. Patient understands no appointment is scheduled at this time. Patient understands Gay Filler will discuss with Dr Quincy Simmonds and is agreeable to return call to discuss.

## 2015-08-22 NOTE — Telephone Encounter (Signed)
Call to patient to assist in scheduling. Left message to call back.

## 2015-08-23 NOTE — Telephone Encounter (Signed)
Returning a call to Sally. °

## 2015-08-23 NOTE — Telephone Encounter (Signed)
Calling to speak with Connie Chase.

## 2015-08-23 NOTE — Telephone Encounter (Signed)
Patient is avaible 09/25/15 or 09/27/15 for ultrasound am preferred.

## 2015-08-29 NOTE — Telephone Encounter (Signed)
Called patient for scheduling. Left voicemail.

## 2015-08-29 NOTE — Telephone Encounter (Signed)
Patient returning your call.

## 2015-09-06 HISTORY — PX: ABLATION: SHX5711

## 2015-09-07 NOTE — Telephone Encounter (Signed)
Call to patient to assist in scheduling PUS. Patient can speak with triage staff or becky (referral coordinator) for scheduling. Recommend she proceed with scheduling.

## 2015-09-14 ENCOUNTER — Telehealth: Payer: Self-pay | Admitting: Obstetrics and Gynecology

## 2015-09-14 ENCOUNTER — Other Ambulatory Visit: Payer: Self-pay | Admitting: Internal Medicine

## 2015-09-14 NOTE — Telephone Encounter (Signed)
Call to patient to schedule PUS appointment. She can speak with [facility] or triage staff to assist with scheduling.

## 2015-09-14 NOTE — Telephone Encounter (Signed)
Called patient to schedule PUS. Patient already aware of benefit. Left voicemail to return call.

## 2015-09-18 NOTE — Telephone Encounter (Signed)
Left message to call Hilliary Jock at [phone]. 

## 2015-09-18 NOTE — Telephone Encounter (Signed)
Called patient to schedule ultrasound. Patient already aware of benefit. Left voicemail to return call.

## 2015-09-21 NOTE — Telephone Encounter (Signed)
Patient called back ready to schedule pelvic ultrasound. Requested 09/27/15. Last remaining appointment 11am. Patient scheduled. Patient aware and agreeable to 72 hour cancellation policy with [id number]. Reviewed this several times due to partial language barrier. Patient stated she would check with her spouse due to his work schedule and transportation. I reviewed policy again and urged her to call today or Monday to stay within 72 hour window. Patient agreeable. No further questions. Ok to close.

## 2015-09-24 NOTE — Telephone Encounter (Signed)
Patient is scheduled for PUS on 09/27/2015 at 11 am with 11:30 am consult with Dr.Silva.   Encounter previously closed.

## 2015-09-27 ENCOUNTER — Ambulatory Visit (INDEPENDENT_AMBULATORY_CARE_PROVIDER_SITE_OTHER): Payer: BLUE CROSS/BLUE SHIELD | Admitting: Obstetrics and Gynecology

## 2015-09-27 ENCOUNTER — Ambulatory Visit (INDEPENDENT_AMBULATORY_CARE_PROVIDER_SITE_OTHER): Payer: BLUE CROSS/BLUE SHIELD

## 2015-09-27 ENCOUNTER — Encounter: Payer: Self-pay | Admitting: Obstetrics and Gynecology

## 2015-09-27 VITALS — BP 110/70 | HR 88 | Ht 62.5 in | Wt 98.0 lb

## 2015-09-27 DIAGNOSIS — N950 Postmenopausal bleeding: Secondary | ICD-10-CM

## 2015-09-27 DIAGNOSIS — D259 Leiomyoma of uterus, unspecified: Secondary | ICD-10-CM

## 2015-09-27 NOTE — Progress Notes (Signed)
Subjective  69 y.o. G2P2  female here for pelvic ultrasound for  Postmenopausal bleeding.  Had spotting in September 2016. No further bleeding.  Has vaginal mucous only.   Patient has chronic atrial fibrillation and is on Eliquis.   Not on any HRT.  Prior ultrasound in office 04/03/03 showed 4 fibroids - 2.5 x 3.0 cm, 2.7 x 2.9 cm, 4.1 x 3.7 cm, 1.3 x 1.8 cm. EMS was 8 mm then.  Ovaries not identified.   Had an office  EMB showing benign polyp, prolifrative endometrium with no hyperplasia or malignancy.  Having a blister on her foot and is now on abx through Dr. Inda Merlin.   Hx of hep B carrier.   Objective  Pelvic ultrasound images and report reviewed with patient.  Uterus - 2 fibroids. Largest 41 x 37 mm. Smaller is 20 mm. EMS - 2.17 mm. Ovaries - normal. Free fluid - no     Assessment  Uterine fibroids.  Long standing.  No change in size.  In fact, today has only 2 instead of the previous 4 fibroids.  Thin EMS.  On chronic anticoagulation for atrial fibrillation.   Plan  Discussion of fibroids.  I do not recommend an EMB.  Return for any postmenopausal bleeding.   ___15____ minutes face to face time of which over 50% was spent in counseling.    After visit summary to patient.

## 2015-09-27 NOTE — Patient Instructions (Signed)
Please call for any bleeding or spotting from the vagina!

## 2015-11-15 ENCOUNTER — Other Ambulatory Visit: Payer: Self-pay | Admitting: Internal Medicine

## 2015-11-15 MED ORDER — POTASSIUM CHLORIDE CRYS ER 20 MEQ PO TBCR: 20.0000 meq | EXTENDED_RELEASE_TABLET | Freq: Every day | ORAL | Status: DC

## 2015-12-10 ENCOUNTER — Other Ambulatory Visit: Payer: Self-pay | Admitting: Internal Medicine

## 2015-12-12 ENCOUNTER — Other Ambulatory Visit: Payer: Self-pay | Admitting: Internal Medicine

## 2016-01-11 ENCOUNTER — Ambulatory Visit
Admission: RE | Admit: 2016-01-11 | Discharge: 2016-01-11 | Disposition: A | Discharge status: Home / Self Care | Payer: BLUE CROSS/BLUE SHIELD | Source: Ambulatory Visit | Attending: Gastroenterology | Admitting: Gastroenterology

## 2016-01-11 DIAGNOSIS — B181 Chronic viral hepatitis B without delta-agent: Secondary | ICD-10-CM

## 2016-01-24 ENCOUNTER — Other Ambulatory Visit: Payer: Self-pay | Admitting: Gastroenterology

## 2016-01-24 DIAGNOSIS — B182 Chronic viral hepatitis C: Secondary | ICD-10-CM

## 2016-02-11 ENCOUNTER — Other Ambulatory Visit: Payer: Self-pay | Admitting: Internal Medicine

## 2016-03-03 ENCOUNTER — Telehealth: Payer: Self-pay

## 2016-03-03 NOTE — Telephone Encounter (Signed)
Received a request for a  PA for Dofetilide. The patient is getting it from Dr. Ola Spurr in [facility], who did her ablation. I have made [facility] aware.

## 2016-04-18 ENCOUNTER — Other Ambulatory Visit: Payer: Self-pay | Admitting: Internal Medicine

## 2016-04-18 NOTE — Telephone Encounter (Signed)
Per last office visit with Dr Gari Crown have been referred to Dr. Ola Spurr in [facility]. Also it does not look like Dr Caryl Comes has been filling this. Should it be deferred to Dr Ola Spurr?

## 2016-04-21 NOTE — Telephone Encounter (Signed)
Would forward to Dr. Ola Spurr. Per Linda's note in April, Dr. Ola Spurr was filling this for the patient.

## 2016-05-05 ENCOUNTER — Ambulatory Visit
Admission: RE | Admit: 2016-05-05 | Discharge: 2016-05-05 | Disposition: A | Discharge status: Home / Self Care | Payer: BLUE CROSS/BLUE SHIELD | Source: Ambulatory Visit | Attending: Family Medicine | Admitting: Family Medicine

## 2016-05-05 ENCOUNTER — Other Ambulatory Visit: Payer: Self-pay | Admitting: Family Medicine

## 2016-05-05 DIAGNOSIS — W19XXXA Unspecified fall, initial encounter: Secondary | ICD-10-CM

## 2016-06-06 ENCOUNTER — Other Ambulatory Visit: Payer: Self-pay | Admitting: Internal Medicine

## 2016-06-06 NOTE — Telephone Encounter (Signed)
Request to refill Magnesium oxide. Please advise

## 2016-06-09 NOTE — Telephone Encounter (Signed)
OK to refill  Thanks

## 2016-06-12 ENCOUNTER — Other Ambulatory Visit: Payer: BLUE CROSS/BLUE SHIELD

## 2016-07-10 ENCOUNTER — Ambulatory Visit
Admission: RE | Admit: 2016-07-10 | Discharge: 2016-07-10 | Disposition: A | Discharge status: Home / Self Care | Payer: BLUE CROSS/BLUE SHIELD | Source: Ambulatory Visit | Attending: Gastroenterology | Admitting: Gastroenterology

## 2016-07-10 DIAGNOSIS — B182 Chronic viral hepatitis C: Secondary | ICD-10-CM

## 2016-07-24 ENCOUNTER — Other Ambulatory Visit: Payer: Self-pay | Admitting: Gastroenterology

## 2016-07-24 DIAGNOSIS — B181 Chronic viral hepatitis B without delta-agent: Secondary | ICD-10-CM

## 2016-08-29 ENCOUNTER — Ambulatory Visit: Payer: BLUE CROSS/BLUE SHIELD | Admitting: Obstetrics and Gynecology

## 2016-08-29 ENCOUNTER — Encounter: Payer: Self-pay | Admitting: Obstetrics and Gynecology

## 2016-08-29 ENCOUNTER — Ambulatory Visit (INDEPENDENT_AMBULATORY_CARE_PROVIDER_SITE_OTHER): Payer: BLUE CROSS/BLUE SHIELD | Admitting: Obstetrics and Gynecology

## 2016-08-29 VITALS — BP 102/68 | HR 60 | Resp 16 | Ht 62.5 in | Wt 103.0 lb

## 2016-08-29 DIAGNOSIS — Z01419 Encounter for gynecological examination (general) (routine) without abnormal findings: Secondary | ICD-10-CM

## 2016-08-29 DIAGNOSIS — I4891 Unspecified atrial fibrillation: Secondary | ICD-10-CM | POA: Diagnosis not present

## 2016-08-29 NOTE — Progress Notes (Signed)
70 y.o. G2P2 Married [place] female here for annual exam.  Left ankle fracture April 23, 2016. Using a cane.   Had postmenopausal bleeding last fall. Not returned.  Pelvic ultrasound showed 2 fibroids and then EMS 2.52mm. Normal ovaries.   On Eliquis.   Has atrial fibrillation.  Did ablation last October and will repeat this again this year.  Sees Dr. Ola Spurr at [facility].   PCP:  Darcus Austin, MD   Patient's last menstrual period was 11/10/2006 (approximate).           Sexually active: No. female The current method of family planning is vasectomy.    Exercising: Yes.    Stationary bike Smoker:  no  Health Maintenance: Pap:  07-05-14 Neg:Neg HR HPV.  08/08/15 - negative.  History of abnormal Pap:  no MMG:  07-13-14 Density Cat.D/59mm reniform focal asymmetry in Rt.breast/left breast neg.--further views of Rt.breast on 07-21-14 do not reproduce the area in questions and presumably represents superimposed breast tissue.  Benign/BiRads 2:[facility] (Patient states doesn't have mammogram every year.  She states this is painful and she does not wish to do this.  She would not treat a breast cancer if it were to occur.)  Colonoscopy: 2002 normal with [facility].  BMD:   07-13-14  Result  Osteopenia:[facility]--Right hip T Score - 2.1 TDaP:  2008 Gardasil:   N/A  Hep C:  Will check with PCP.   I suspect she has been tested.  Screening Labs:  Hb today: PCP, Urine today: PCP   reports that she has never smoked. She has never used smokeless tobacco. She reports that she does not drink alcohol or use drugs.  Past Medical History:  Diagnosis Date  . 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 Fibroid 2004, 2016  . Hemorrhage anterior chamber eye    On pradaxa  . Hepatitis B infection    Chronic Hep B carrier, on suppressive Tx  . Osteoarthritis        . Persistent atrial fibrillation (HCC)    a. s/p DCCV b. intolerant of Flecainide    Past Surgical History:  Procedure Laterality Date  .  CARDIAC ELECTROPHYSIOLOGY MAPPING AND ABLATION  06/2015  . CARDIOVERSION  04/02/2012   Procedure: CARDIOVERSION;  Surgeon: Deboraha Sprang, MD;  Location: [place];  Service: Cardiovascular;  Laterality: N/A;  . CARDIOVERSION N/A 09/12/2013   Procedure: CARDIOVERSION;  Surgeon: Deboraha Sprang, MD;  Location: [place];  Service: Cardiovascular;  Laterality: N/A;  . CARDIOVERSION N/A 01/26/2014   Procedure: CARDIOVERSION;  Surgeon: Deboraha Sprang, MD;  Location: [facility] ENDOSCOPY;  Service: Cardiovascular;  Laterality: N/A;  . CARDIOVERSION N/A 03/30/2015   Procedure: CARDIOVERSION;  Surgeon: Thompson Grayer, MD;  Location: [facility] ENDOSCOPY;  Service: Cardiovascular;  Laterality: N/A;  . CATARACT EXTRACTION W/ INTRAOCULAR LENS IMPLANT Left 03/2011  . CATARACT EXTRACTION W/ INTRAOCULAR LENS IMPLANT Right 2013    Current Outpatient Prescriptions  Medication Sig Dispense Refill  . apixaban (ELIQUIS) 2.5 MG TABS tablet Take 2.5 mg by mouth 2 (two) times daily.    . beta carotene w/minerals (OCUVITE) tablet Take 1 tablet by mouth daily.     . cholecalciferol (VITAMIN D) 1000 UNITS tablet Take 1,000 Units by mouth daily.    Marland Kitchen diltiazem (CARDIZEM CD) 120 MG 24 hr capsule TAKE 1 CAPSULE (120 MG TOTAL) BY MOUTH DAILY. 60 capsule 0  . dofetilide (TIKOSYN) 125 MCG capsule Take 1 capsule by mouth daily.    . dorzolamide (TRUSOPT)  2 % ophthalmic solution Place 1 drop into the right eye daily.    Marland Kitchen entecavir (BARACLUDE) 1 MG tablet Take 1 mg by mouth daily.    Marland Kitchen KLOR-CON M20 20 MEQ tablet TAKE 1 TABLET (20 MEQ TOTAL) BY MOUTH DAILY. 60 tablet 0  . magnesium oxide (MAG-OX) 400 MG tablet Take 400 mg by mouth at bedtime.    . Multiple Vitamins-Minerals (CENTRUM SILVER PO) Take 1 tablet by mouth daily.     . Omega-3 Fatty Acids (OMEGA 3 PO) Take 4 tablets by mouth daily. 2 tabs in the am, 1 tab at lunch and 1 tab in the pm.    . vitamin C (ASCORBIC ACID) 500 MG tablet Take 500 mg by mouth daily.     No current  facility-administered medications for this visit.     Family History  Problem Relation Age of Onset  . Other      Neg cardiac family Hx    ROS:  Pertinent items are noted in HPI.  Otherwise, a comprehensive ROS was negative.  Exam:   BP 102/68 (BP Location: Right Arm, Patient Position: Sitting, Cuff Size: Normal)   Pulse 60 Comment: irregular  Resp 16   Ht 5' 2.5" (1.588 m)   Wt 103 lb (46.7 kg)   LMP 11/10/2006 (Approximate)   BMI 18.54 kg/m     General appearance: alert, cooperative and appears stated age Head: Normocephalic, without obvious abnormality, atraumatic Neck: no adenopathy, supple, symmetrical, trachea midline and thyroid normal to inspection and palpation Lungs: clear to auscultation bilaterally Breasts: normal appearance, no masses or tenderness, No nipple retraction or dimpling, No nipple discharge or bleeding, No axillary or supraclavicular adenopathy Heart: HR 150 - 160.  Abdomen: soft, non-tender; no masses, no organomegaly Extremities: extremities normal, atraumatic, no cyanosis or edema Skin: Skin color, texture, turgor normal. No rashes or lesions Lymph nodes: Cervical, supraclavicular, and axillary nodes normal. No abnormal inguinal nodes palpated Neurologic: Grossly normal  Pelvic: External genitalia:  no lesions              Urethra:  normal appearing urethra with no masses, tenderness or lesions              Bartholins and Skenes: normal                 Vagina: normal appearing vagina with normal color and discharge, no lesions              Cervix: no lesions              Pap taken: No. Bimanual Exam:  Uterus:  normal size, contour, position, consistency, mobility, non-tender              Adnexa: no mass, fullness, tenderness              Rectal exam: Yes.  .  Confirms.              Anus:  normal sphincter tone, no lesions  Chaperone was present for exam.  Assessment:   Well woman visit with normal exam. Atrial fibrillation.  Tachycardia  today. On Eliquis blood thinner. Chronic hep B carrier. Osteopenia.   Plan: Declines further mammograms. Pap next year.  Discussed Calcium, Vitamin D, regular exercise program including cardiovascular and weight bearing exercise. Will schedule bone density at a later time. Will contact Dr. Ola Spurr about patient's tachycardia so she has a proper disposition.  Follow up annually and prn.  After visit summary provided.

## 2016-08-29 NOTE — Progress Notes (Signed)
Call to Dr.Fitzgerald's office [facility] while patient is here in the office today. Appointment scheduled for today at 11 am. Patient is agreeable to date and time and her husband will take her to their office at this time to be seen for evaluation.

## 2016-08-29 NOTE — Patient Instructions (Signed)

## 2016-10-09 ENCOUNTER — Telehealth: Payer: Self-pay | Admitting: *Deleted

## 2016-10-09 NOTE — Telephone Encounter (Signed)
Left message to call Sharee Pimple at [phone].   Calling to report BMD results as requested by Dr. Quincy Simmonds -please report osteoporosis to patient. She is at risk for further fractures. Schedule appt with Dr. Quincy Simmonds.

## 2016-10-13 NOTE — Telephone Encounter (Signed)
Spoke with patient, advised of BMD results as seen below per Dr. Quincy Simmonds. Patient states she would like morning appt week of 12/18- patient scheduled for 12/20 at 0815. Patient request copy of BMD results to be mailed to home to review prior to appt. Patient verbalizes understanding and is agreeable.  Copy of results mailed.   Routing to provider for final review. Patient is agreeable to disposition. Will close encounter.

## 2016-10-13 NOTE — Telephone Encounter (Signed)
Patient returning your call.

## 2016-10-16 ENCOUNTER — Encounter: Payer: Self-pay | Admitting: Obstetrics and Gynecology

## 2016-10-29 ENCOUNTER — Ambulatory Visit (INDEPENDENT_AMBULATORY_CARE_PROVIDER_SITE_OTHER): Payer: BLUE CROSS/BLUE SHIELD | Admitting: Obstetrics and Gynecology

## 2016-10-29 VITALS — BP 102/60 | HR 76 | Resp 14 | Ht 62.5 in | Wt 100.8 lb

## 2016-10-29 DIAGNOSIS — M810 Age-related osteoporosis without current pathological fracture: Secondary | ICD-10-CM | POA: Diagnosis not present

## 2016-10-29 LAB — COMPREHENSIVE METABOLIC PANEL WITH GFR
ALT: 18 U/L (ref 6–29)
AST: 24 U/L (ref 10–35)
Albumin: 4.1 g/dL (ref 3.6–5.1)
Alkaline Phosphatase: 60 U/L (ref 33–130)
BUN: 15 mg/dL (ref 7–25)
CO2: 29 mmol/L (ref 20–31)
Calcium: 9.4 mg/dL (ref 8.6–10.4)
Chloride: 107 mmol/L (ref 98–110)
Creat: 0.53 mg/dL — ABNORMAL LOW (ref 0.60–0.93)
Glucose, Bld: 101 mg/dL — ABNORMAL HIGH (ref 65–99)
Potassium: 4.7 mmol/L (ref 3.5–5.3)
Sodium: 143 mmol/L (ref 135–146)
Total Bilirubin: 0.4 mg/dL (ref 0.2–1.2)
Total Protein: 6.5 g/dL (ref 6.1–8.1)

## 2016-10-29 MED ORDER — ALENDRONATE SODIUM 70 MG PO TABS: 70.0000 mg | ORAL_TABLET | ORAL | 11 refills | Status: DC

## 2016-10-29 NOTE — Progress Notes (Signed)
GYNECOLOGY  VISIT   HPI: 70 y.o.   Married  Asian  female   G2P2 with Patient's last menstrual period was 11/10/2006 (approximate).   here for consult on BMD.   BMD at [facility] showed osteoporosis of the spine.  T score is -2.7. Hx left ankle fracture in June 2017.   Had cardiac ablation after her last visit here.  Feels so much better. She is very appreciative that I referred her back to cardiology for an urgent visit. On Eliquis.  Patient has chronic Hep B.  Hx constipation.  No history of reflux.  Hx of dental implant placed a long time ago.  Rare ETOH use.  Not smoker.  Decreased exercise due to fracture.  No chronic diarrhea. No steroid use.   PCP is Dr. Darcus Austin.  GYNECOLOGIC HISTORY: Patient's last menstrual period was 11/10/2006 (approximate). Contraception:  Postmenopause Menopausal hormone therapy:  none Last mammogram:  07/21/14 Teressa Lower D, [facility] Last pap smear:   08/08/15 WNL  Last Bone Density: 09/23/16 Osteoporosis, [facility]        OB History    Gravida Para Term Preterm AB Living   2 2       2    SAB TAB Ectopic Multiple Live Births                     Patient Active Problem List   Diagnosis Date Noted  . Persistent atrial fibrillation ([place]) 03/27/2015  . Alopecia 02/09/2013  . Palpitations 06/13/2011  . Chest pain 06/13/2011  . ATRIAL FIBRILLATION 12/31/2009  . FATIGUE 12/31/2009    Past Medical History:  Diagnosis Date  . 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 Fibroid 2004, 2016  . Hemorrhage anterior chamber eye    On pradaxa  . Hepatitis B infection    Chronic Hep B carrier, on suppressive Tx  . Osteoarthritis        . Persistent atrial fibrillation (HCC)    a. s/p DCCV b. intolerant of Flecainide    Past Surgical History:  Procedure Laterality Date  . CARDIAC ELECTROPHYSIOLOGY MAPPING AND ABLATION  06/2015  . CARDIOVERSION  04/02/2012   Procedure: CARDIOVERSION;  Surgeon: Deboraha Sprang, MD;  Location: [place];  Service:  Cardiovascular;  Laterality: N/A;  . CARDIOVERSION N/A 09/12/2013   Procedure: CARDIOVERSION;  Surgeon: Deboraha Sprang, MD;  Location: [place];  Service: Cardiovascular;  Laterality: N/A;  . CARDIOVERSION N/A 01/26/2014   Procedure: CARDIOVERSION;  Surgeon: Deboraha Sprang, MD;  Location: [facility] ENDOSCOPY;  Service: Cardiovascular;  Laterality: N/A;  . CARDIOVERSION N/A 03/30/2015   Procedure: CARDIOVERSION;  Surgeon: Thompson Grayer, MD;  Location: [facility] ENDOSCOPY;  Service: Cardiovascular;  Laterality: N/A;  . CATARACT EXTRACTION W/ INTRAOCULAR LENS IMPLANT Left 03/2011  . CATARACT EXTRACTION W/ INTRAOCULAR LENS IMPLANT Right 2013    Current Outpatient Prescriptions  Medication Sig Dispense Refill  . apixaban (ELIQUIS) 2.5 MG TABS tablet Take 2.5 mg by mouth 2 (two) times daily.    . beta carotene w/minerals (OCUVITE) tablet Take 1 tablet by mouth daily.     . cholecalciferol (VITAMIN D) 1000 UNITS tablet Take 1,000 Units by mouth daily.    Marland Kitchen diltiazem (CARDIZEM CD) 120 MG 24 hr capsule TAKE 1 CAPSULE (120 MG TOTAL) BY MOUTH DAILY. 60 capsule 0  . diltiazem (CARDIZEM) 30 MG tablet Take 30 mg by mouth as needed.     . dofetilide (TIKOSYN) 125 MCG capsule Take 1 capsule by mouth daily.    Marland Kitchen  dorzolamide (TRUSOPT) 2 % ophthalmic solution Place 1 drop into the right eye daily.    Marland Kitchen entecavir (BARACLUDE) 1 MG tablet Take 1 mg by mouth daily.    Marland Kitchen KLOR-CON M20 20 MEQ tablet TAKE 1 TABLET (20 MEQ TOTAL) BY MOUTH DAILY. 60 tablet 0  . magnesium oxide (MAG-OX) 400 MG tablet Take 400 mg by mouth at bedtime.    . Multiple Vitamins-Minerals (CENTRUM SILVER PO) Take 1 tablet by mouth daily.     . Omega-3 Fatty Acids (OMEGA 3 PO) Take 4 tablets by mouth daily. 2 tabs in the am, 1 tab at lunch and 1 tab in the pm.    . vitamin C (ASCORBIC ACID) 500 MG tablet Take 500 mg by mouth daily.     No current facility-administered medications for this visit.      ALLERGIES: Patient has no known allergies.  Family  History  Problem Relation Age of Onset  . Other      Neg cardiac family Hx    Social History   Social History  . Marital status: Married    Spouse name: N/A  . Number of children: N/A  . Years of education: N/A   Occupational History  . Not on file.   Social History Main Topics  . Smoking status: Never Smoker  . Smokeless tobacco: Never Used  . Alcohol use No  . Drug use: No  . Sexual activity: No     Comment: husband had vasectomy    Other Topics Concern  . Not on file   Social History Narrative   Regular exercise    ROS:  Pertinent items are noted in HPI.  PHYSICAL EXAMINATION:    BP 102/60 (BP Location: Left Arm, Patient Position: Sitting, Cuff Size: Small)   Pulse 76   Resp 14   Ht 5' 2.5" (1.588 m)   Wt 100 lb 12.8 oz (45.7 kg)   LMP 11/10/2006 (Approximate)   BMI 18.14 kg/m     General appearance: alert, cooperative and appears stated age Using a cane to walk.   ASSESSMENT  Osteoporosis. Hx fracture 6 months ago.   PLAN  Discussion of osteoporosis - risk factors, fracture risk, treatment options, and follow up.  Will start Fosamax 70 mg weekly.  Discussed how to take medication and side effects.  Discussed importance of calcium and vit D.  I recommend weight bearing exercise.  Discussed reducing risk of falls.  Follow up in 3 months.  Next BMD in 2 years.   An After Visit Summary was printed and given to the patient.  ___25___ minutes face to face time of which over 50% was spent in counseling.

## 2016-10-29 NOTE — Progress Notes (Signed)
Opened in error. Encounter reviewed by Dr. Aundria Rud.

## 2016-10-29 NOTE — Patient Instructions (Signed)

## 2016-10-30 ENCOUNTER — Encounter: Payer: Self-pay | Admitting: Obstetrics and Gynecology

## 2016-10-30 LAB — VITAMIN D 25 HYDROXY (VIT D DEFICIENCY, FRACTURES): VIT D 25 HYDROXY: 60 ng/mL (ref 30–100)

## 2016-11-05 ENCOUNTER — Telehealth: Payer: Self-pay | Admitting: Obstetrics and Gynecology

## 2016-11-05 NOTE — Telephone Encounter (Signed)
Patient says she is returning Amanda's call.

## 2016-11-05 NOTE — Telephone Encounter (Signed)
Spoke with patient, advised as seen below per Dr. Quincy Simmonds. Patient states she is unable to access MyChart until later next week. Patient request copy of 10/29/16 Vit D labs and CMP to be mailed -verified address on file. Patient states she is unable to schedule 3 month f/u at this time, will return call to schedule. Patient verbalizes understanding and is agreeable.  Routing to provider for final review. Patient is agreeable to disposition. Will close encounter.  Cc: Marisa Sprinkles  Notes Recorded by Lowella Fairy, CMA on 11/05/2016 at 11:22 AM EST Called patient at [phone] and per DPR, left detailed message below since patient has not reviewed her "MyChart" message. ------  Notes Recorded by Nunzio Cobbs, MD on 10/30/2016 at 5:29 PM EST Results to patient through My Chart.  Hello Ms. Connie Chase,   Your blood work indicates a normal vitamin D and calcium level and good kidney function.   You may start your Fosamax medication.   I will see you back in 3 months.   Thank you,   Josefa Half, MD

## 2016-11-07 ENCOUNTER — Encounter: Payer: Self-pay | Admitting: Obstetrics and Gynecology

## 2016-11-21 ENCOUNTER — Telehealth: Payer: Self-pay | Admitting: Obstetrics and Gynecology

## 2016-11-21 ENCOUNTER — Other Ambulatory Visit: Payer: Self-pay | Admitting: Obstetrics and Gynecology

## 2016-11-21 MED ORDER — ALENDRONATE SODIUM 70 MG PO TABS: 70.0000 mg | ORAL_TABLET | ORAL | 3 refills | Status: DC

## 2016-11-21 NOTE — Telephone Encounter (Signed)
Spoke with patient and advised osteoporosis information packet at front desk in our office and she can pick up. Patient states not sure when she will get by to pick up, may have husband come by. She also requests we send in new Rx for Fosamax to her pharmacy for a 3 month supply since it is hard for her to get to pharmacy/[facility]. Advised will send note to Dr.Silva

## 2016-11-21 NOTE — Telephone Encounter (Signed)
I sent a new Rx for Fosamax for 90 day supply with refills.

## 2016-11-21 NOTE — Telephone Encounter (Signed)
Please contact patient and let her know that I have left a package of information at the front desk regarding osteoporosis treatment.   I send her a message through My Chart regarding this, but she did not pick up the message.   Thank you.

## 2016-11-24 NOTE — Telephone Encounter (Signed)
Called patient and per DPR, left detailed message stating Dr.Silva sent in new RX for 90 supply of Fosamax. Call if any messages.

## 2016-11-26 ENCOUNTER — Encounter: Payer: Self-pay | Admitting: Obstetrics and Gynecology

## 2017-01-15 ENCOUNTER — Ambulatory Visit
Admission: RE | Admit: 2017-01-15 | Discharge: 2017-01-15 | Disposition: A | Discharge status: Home / Self Care | Payer: BLUE CROSS/BLUE SHIELD | Source: Ambulatory Visit | Attending: Gastroenterology | Admitting: Gastroenterology

## 2017-01-15 DIAGNOSIS — B181 Chronic viral hepatitis B without delta-agent: Secondary | ICD-10-CM

## 2017-02-02 NOTE — Progress Notes (Signed)
GYNECOLOGY  VISIT   HPI: 71 y.o.   Married  Asian  female   G2P2 with Patient's last menstrual period was 11/10/2006 (approximate).   here for 3 month follow up after beginning Fosamax.  Per patient since starting Fosamax has had muscle pain, weakness, cramp, joint pain. Feels like it is difficult to swallow.   Husband is here for the visit today.  Hx left ankle fracture.  Osteoporosis with T score -2.7.  Chronic hep B and signs of cirrhosis.   PCP - Dr. Darcus Austin.  GYNECOLOGIC HISTORY: Patient's last menstrual period was 11/10/2006 (approximate). Contraception:  Postmenopausal Menopausal hormone therapy:  none Last mammogram: 07/21/14 BIRADS2, Density D, [facility]   Last pap smear: 08-08-15 Neg;07-05-14 Neg:Neg HR HPV        OB History    Gravida Para Term Preterm AB Living   2 2       2    SAB TAB Ectopic Multiple Live Births                     Patient Active Problem List   Diagnosis Date Noted  . Persistent atrial fibrillation ([place]) 03/27/2015  . Alopecia 02/09/2013  . Palpitations 06/13/2011  . Chest pain 06/13/2011  . ATRIAL FIBRILLATION 12/31/2009  . FATIGUE 12/31/2009    Past Medical History:  Diagnosis Date  . 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 Fibroid 2004, 2016  . Hemorrhage anterior chamber eye    On pradaxa  . Hepatitis B infection    Chronic Hep B carrier, on suppressive Tx  . Osteoarthritis        . Persistent atrial fibrillation (HCC)    a. s/p DCCV b. intolerant of Flecainide    Past Surgical History:  Procedure Laterality Date  . CARDIAC ELECTROPHYSIOLOGY MAPPING AND ABLATION  06/2015  . CARDIOVERSION  04/02/2012   Procedure: CARDIOVERSION;  Surgeon: Deboraha Sprang, MD;  Location: [place];  Service: Cardiovascular;  Laterality: N/A;  . CARDIOVERSION N/A 09/12/2013   Procedure: CARDIOVERSION;  Surgeon: Deboraha Sprang, MD;  Location: [place];  Service: Cardiovascular;  Laterality: N/A;  . CARDIOVERSION N/A 01/26/2014   Procedure:  CARDIOVERSION;  Surgeon: Deboraha Sprang, MD;  Location: [facility] ENDOSCOPY;  Service: Cardiovascular;  Laterality: N/A;  . CARDIOVERSION N/A 03/30/2015   Procedure: CARDIOVERSION;  Surgeon: Thompson Grayer, MD;  Location: [facility] ENDOSCOPY;  Service: Cardiovascular;  Laterality: N/A;  . CATARACT EXTRACTION W/ INTRAOCULAR LENS IMPLANT Left 03/2011  . CATARACT EXTRACTION W/ INTRAOCULAR LENS IMPLANT Right 2013    Current Outpatient Prescriptions  Medication Sig Dispense Refill  . alendronate (FOSAMAX) 70 MG tablet Take 1 tablet (70 mg total) by mouth every 7 (seven) days. 12 tablet 3  . apixaban (ELIQUIS) 2.5 MG TABS tablet Take 2.5 mg by mouth 2 (two) times daily.    . beta carotene w/minerals (OCUVITE) tablet Take 1 tablet by mouth daily.     . cholecalciferol (VITAMIN D) 1000 UNITS tablet Take 1,000 Units by mouth daily.    Marland Kitchen diltiazem (CARDIZEM CD) 120 MG 24 hr capsule TAKE 1 CAPSULE (120 MG TOTAL) BY MOUTH DAILY. 60 capsule 0  . diltiazem (CARDIZEM) 30 MG tablet Take 30 mg by mouth as needed.     . dofetilide (TIKOSYN) 125 MCG capsule Take 1 capsule by mouth daily.    . dorzolamide (TRUSOPT) 2 % ophthalmic solution Place 1 drop into the right eye daily.    Marland Kitchen entecavir (BARACLUDE) 1 MG tablet Take 1  mg by mouth daily.    Marland Kitchen KLOR-CON M20 20 MEQ tablet TAKE 1 TABLET (20 MEQ TOTAL) BY MOUTH DAILY. 60 tablet 0  . magnesium oxide (MAG-OX) 400 MG tablet Take 400 mg by mouth at bedtime.    . Multiple Vitamins-Minerals (CENTRUM SILVER PO) Take 1 tablet by mouth daily.     . Omega-3 Fatty Acids (OMEGA 3 PO) Take 4 tablets by mouth daily. 2 tabs in the am, 1 tab at lunch and 1 tab in the pm.     No current facility-administered medications for this visit.      ALLERGIES: Patient has no known allergies.  Family History  Problem Relation Age of Onset  . Other      Neg cardiac family Hx    Social History   Social History  . Marital status: Married    Spouse name: N/A  . Number of children: N/A  . Years of  education: N/A   Occupational History  . Not on file.   Social History Main Topics  . Smoking status: Never Smoker  . Smokeless tobacco: Never Used  . Alcohol use No  . Drug use: No  . Sexual activity: No     Comment: husband had vasectomy    Other Topics Concern  . Not on file   Social History Narrative   Regular exercise    ROS:  Pertinent items are noted in HPI.  PHYSICAL EXAMINATION:    BP 102/60 (BP Location: Right Arm, Patient Position: Sitting, Cuff Size: Normal)   Pulse 80   Resp 16   Ht 5\' 2"  (1.575 m)   Wt 96 lb 12.8 oz (43.9 kg)   LMP 11/10/2006 (Approximate)   BMI 17.70 kg/m     General appearance: alert, cooperative and appears stated age  ASSESSMENT  Osteoporosis.  Intolerance to Fosamax.   Chronic hep B.  New diagnosis of cirrhosis.  PLAN  Stop Fosamax.  Discussed alternatives to this - Actonel, Reclast, Calcitonin. I do not recommend Prolia.  Patient will switch to oral Actonel 150 mg monthly.  Instructed in use and potential side effects. Follow up for annual exam in October and prn.    An After Visit Summary was printed and given to the patient.  ___25___ minutes face to face time of which over 50% was spent in counseling.

## 2017-02-04 ENCOUNTER — Ambulatory Visit (INDEPENDENT_AMBULATORY_CARE_PROVIDER_SITE_OTHER): Payer: BLUE CROSS/BLUE SHIELD | Admitting: Obstetrics and Gynecology

## 2017-02-04 ENCOUNTER — Encounter: Payer: Self-pay | Admitting: Obstetrics and Gynecology

## 2017-02-04 VITALS — BP 102/60 | HR 80 | Resp 16 | Ht 62.0 in | Wt 96.8 lb

## 2017-02-04 DIAGNOSIS — M810 Age-related osteoporosis without current pathological fracture: Secondary | ICD-10-CM

## 2017-02-04 MED ORDER — RISEDRONATE SODIUM 150 MG PO TABS: 150.0000 mg | ORAL_TABLET | ORAL | 6 refills | Status: DC

## 2017-02-04 NOTE — Patient Instructions (Signed)
Please call if you have problems with your Actonel medication.

## 2017-05-22 ENCOUNTER — Other Ambulatory Visit: Payer: Self-pay | Admitting: Gastroenterology

## 2017-05-22 DIAGNOSIS — Z1289 Encounter for screening for malignant neoplasm of other sites: Secondary | ICD-10-CM

## 2017-05-22 DIAGNOSIS — B181 Chronic viral hepatitis B without delta-agent: Secondary | ICD-10-CM

## 2017-07-23 ENCOUNTER — Ambulatory Visit
Admission: RE | Admit: 2017-07-23 | Discharge: 2017-07-23 | Disposition: A | Discharge status: Home / Self Care | Payer: BLUE CROSS/BLUE SHIELD | Source: Ambulatory Visit | Attending: Gastroenterology | Admitting: Gastroenterology

## 2017-07-23 DIAGNOSIS — B181 Chronic viral hepatitis B without delta-agent: Secondary | ICD-10-CM

## 2017-07-23 DIAGNOSIS — Z1289 Encounter for screening for malignant neoplasm of other sites: Secondary | ICD-10-CM

## 2017-08-07 ENCOUNTER — Other Ambulatory Visit: Payer: Self-pay | Admitting: Obstetrics and Gynecology

## 2017-08-07 NOTE — Telephone Encounter (Signed)
Medication refill request: actonel  Last AEX:  08/29/16 Dr. Quincy Simmonds  Next AEX: 09/02/17  Last MMG (if hormonal medication request): 07/21/14 BIRADS2:benign  Refill authorized: 02/04/17 #1tab/6R. Today please advise.

## 2017-08-23 ENCOUNTER — Encounter: Payer: Self-pay | Admitting: Obstetrics and Gynecology

## 2017-08-31 NOTE — Progress Notes (Signed)
71 y.o. G2P2 Married [place] female here for annual exam.    States she is in afib 20% of the time.   Uses a walker and a cane. Some bone pain in hips with Actonel use but it is tolerable. Taking monthly Actonel.   States her PCP wants her to gain weight.   Has constipation.  Eating vegetables and fiber.   Denies vaginal bleeding or bowel problems.   PCP: Darcus Austin, MD    Patient's last menstrual period was 11/10/2006 (approximate).           Sexually active: No. female The current method of family planning is vasectomy/postmenopausal.    Exercising: Yes.    Stationary bike and resistance bands Smoker:  no  Health Maintenance: Pap: 08-08-15 Neg, 07-05-14 Neg:Neg HR HPV History of abnormal Pap:  no MMG:07-13-14 Density Cat.D/71mm reniform focal asymmetry in Rt.breast/left breast neg.--further views of Rt.breast on 07-21-14 do not reproduce the area in questions and presumably represents superimposed breast tissue. Benign/BiRads 2:[facility] (Patient states doesn't have mammogram every year.  She states this is painful and she does not wish to do this.  She would not treat a breast cancer if it were to occur.)   Colonoscopy: 2002 normal with [facility]--does yearly stool cards BMD: 09-23-16  Result :Osteoporosis T score -2.7:[facility] TDaP: 08/2017 Gardasil:   no HIV:not done Hep C:not done Screening Labs:  Hb today: PCP, Urine today: not done   reports that she has never smoked. She has never used smokeless tobacco. She reports that she does not drink alcohol or use drugs.  Past Medical History:  Diagnosis Date  . 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 Cirrhosis of liver ([place])   . Fibroid 2004, 2016  . Hemorrhage anterior chamber eye    On pradaxa  . Hepatitis B infection    Chronic Hep B carrier, on suppressive Tx  . Osteoarthritis        . Osteoporosis   . Persistent atrial fibrillation (HCC)    a. s/p DCCV b. intolerant of Flecainide    Past Surgical History:  Procedure  Laterality Date  . CARDIAC ELECTROPHYSIOLOGY MAPPING AND ABLATION  06/2015  . CARDIOVERSION  04/02/2012   Procedure: CARDIOVERSION;  Surgeon: Deboraha Sprang, MD;  Location: [place];  Service: Cardiovascular;  Laterality: N/A;  . CARDIOVERSION N/A 09/12/2013   Procedure: CARDIOVERSION;  Surgeon: Deboraha Sprang, MD;  Location: [place];  Service: Cardiovascular;  Laterality: N/A;  . CARDIOVERSION N/A 01/26/2014   Procedure: CARDIOVERSION;  Surgeon: Deboraha Sprang, MD;  Location: [facility] ENDOSCOPY;  Service: Cardiovascular;  Laterality: N/A;  . CARDIOVERSION N/A 03/30/2015   Procedure: CARDIOVERSION;  Surgeon: Thompson Grayer, MD;  Location: [facility] ENDOSCOPY;  Service: Cardiovascular;  Laterality: N/A;  . CATARACT EXTRACTION W/ INTRAOCULAR LENS IMPLANT Left 03/2011  . CATARACT EXTRACTION W/ INTRAOCULAR LENS IMPLANT Right 2013    Current Outpatient Prescriptions  Medication Sig Dispense Refill  . apixaban (ELIQUIS) 2.5 MG TABS tablet Take 2.5 mg by mouth 2 (two) times daily.    . beta carotene w/minerals (OCUVITE) tablet Take 1 tablet by mouth daily.     . cholecalciferol (VITAMIN D) 1000 UNITS tablet Take 1,000 Units by mouth daily.    Marland Kitchen diltiazem (CARDIZEM CD) 120 MG 24 hr capsule TAKE 1 CAPSULE (120 MG TOTAL) BY MOUTH DAILY. 60 capsule 0  . diltiazem (CARDIZEM) 30 MG tablet Take 30 mg by mouth as needed.     . dofetilide (TIKOSYN) 125 MCG capsule  Take 1 capsule by mouth daily.    . dorzolamide (TRUSOPT) 2 % ophthalmic solution Place 1 drop into the right eye daily.    Marland Kitchen entecavir (BARACLUDE) 1 MG tablet Take 1 mg by mouth daily.    Marland Kitchen KLOR-CON M20 20 MEQ tablet TAKE 1 TABLET (20 MEQ TOTAL) BY MOUTH DAILY. 60 tablet 0  . magnesium oxide (MAG-OX) 400 MG tablet Take 400 mg by mouth at bedtime.    . Multiple Vitamins-Minerals (CENTRUM SILVER PO) Take 1 tablet by mouth daily.     . Omega-3 Fatty Acids (OMEGA 3 PO) Take 4 tablets by mouth daily. 2 tabs in the am, 1 tab at lunch and 1 tab in the pm.    .  risedronate (ACTONEL) 150 MG tablet TAKE 1 TABLET EVERY 30 DAYS WITH WATER ON EMPTY STOMACH NOTHING BY MOUTH OR LIE DOWN FOR NEXT 30 MIN 1 tablet 1   No current facility-administered medications for this visit.     Family History  Problem Relation Age of Onset  . Other Unknown        Neg cardiac family Hx    ROS:  Pertinent items are noted in HPI.  Otherwise, a comprehensive ROS was negative.  Exam:   BP 118/60 (BP Location: Right Arm, Patient Position: Sitting, Cuff Size: Normal)   Pulse 80   Resp 16   Ht 5' 2.5" (1.588 m)   Wt 95 lb 6.4 oz (43.3 kg)   LMP 11/10/2006 (Approximate)   BMI 17.17 kg/m     General appearance: alert, cooperative and appears stated age Head: Normocephalic, without obvious abnormality, atraumatic Neck: no adenopathy, supple, symmetrical, trachea midline and thyroid normal to inspection and palpation Lungs: clear to auscultation bilaterally Breasts: normal appearance, no masses or tenderness, No nipple retraction or dimpling, No nipple discharge or bleeding, No axillary or supraclavicular adenopathy Heart: regular rate and rhythm Abdomen: soft, non-tender; no masses, no organomegaly Extremities: extremities normal, atraumatic, no cyanosis or edema Skin: Skin color, texture, turgor normal. No rashes or lesions Lymph nodes: Cervical, supraclavicular, and axillary nodes normal. No abnormal inguinal nodes palpated Neurologic: Grossly normal  Pelvic: External genitalia:  no lesions              Urethra:  normal appearing urethra with no masses, tenderness or lesions              Bartholins and Skenes: normal                 Vagina: normal appearing vagina with normal color and discharge, no lesions              Cervix: no lesions              Pap taken: Yes.   Bimanual Exam:  Uterus:  normal size, contour, position, consistency, mobility, non-tender              Adnexa: no mass, fullness, tenderness              Rectal exam: Yes.  .  Confirms.               Anus:  normal sphincter tone, no lesions  Chaperone was present for exam.  Assessment:   Well woman visit with normal exam. Osteoporosis.  On Actonel.  Chronic hepatitis B with cirrhosis.  Atrial fibrillation.   Plan: She declines mammograms.  She understands that breast cancer is very treatable. Recommended self breast awareness. Pap and HR HPV as above. Guidelines  for Calcium, Vitamin D, regular exercise program including cardiovascular and weight bearing exercise. BMD next year.  Refill of Actonel 150 mg monthly for one year.  Follow up annually and prn.    After visit summary provided.

## 2017-09-02 ENCOUNTER — Ambulatory Visit (INDEPENDENT_AMBULATORY_CARE_PROVIDER_SITE_OTHER): Payer: BLUE CROSS/BLUE SHIELD | Admitting: Obstetrics and Gynecology

## 2017-09-02 ENCOUNTER — Encounter: Payer: Self-pay | Admitting: Obstetrics and Gynecology

## 2017-09-02 ENCOUNTER — Other Ambulatory Visit (HOSPITAL_COMMUNITY)
Admission: RE | Admit: 2017-09-02 | Discharge: 2017-09-02 | Disposition: A | Discharge status: Home / Self Care | Payer: BLUE CROSS/BLUE SHIELD | Source: Ambulatory Visit | Attending: Obstetrics and Gynecology | Admitting: Obstetrics and Gynecology

## 2017-09-02 VITALS — BP 118/60 | HR 80 | Resp 16 | Ht 62.5 in | Wt 95.4 lb

## 2017-09-02 DIAGNOSIS — Z01419 Encounter for gynecological examination (general) (routine) without abnormal findings: Secondary | ICD-10-CM

## 2017-09-02 MED ORDER — RISEDRONATE SODIUM 150 MG PO TABS: ORAL_TABLET | ORAL | 11 refills | Status: DC

## 2017-09-02 NOTE — Patient Instructions (Signed)

## 2017-09-07 LAB — CYTOLOGY - PAP

## 2017-09-18 ENCOUNTER — Other Ambulatory Visit: Payer: Self-pay | Admitting: Obstetrics and Gynecology

## 2017-12-18 ENCOUNTER — Other Ambulatory Visit: Payer: Self-pay | Admitting: Gastroenterology

## 2017-12-18 DIAGNOSIS — K7460 Unspecified cirrhosis of liver: Secondary | ICD-10-CM

## 2017-12-21 ENCOUNTER — Telehealth: Payer: Self-pay | Admitting: Obstetrics and Gynecology

## 2017-12-21 NOTE — Telephone Encounter (Signed)
Rx signed.  BMD to be done in November 2019.

## 2017-12-21 NOTE — Telephone Encounter (Signed)
Spoke with patient. Patient asking when next BMD due? Requesting to change time of AEX on 09/15/18.   1. Last BMD 09/23/16 at [place]. Osteoporosis, repeat 2 yrs. Advised patient will send signed order to [facility], can then schedule BMD. Patient request to call and schedule, advised will notify once order has been faxed.   2. AEX rescheduled to 09/15/18 at 10:30am with Dr. Quincy Simmonds. Patient is agreeable. Patient request appointment be written down and mailed, confirmed address on file.    Written order to Dr. Quincy Simmonds for BMD.

## 2017-12-21 NOTE — Telephone Encounter (Signed)
Patient is asking to talk with Dr.Silva's nurse about needing a BMD.

## 2017-12-22 NOTE — Telephone Encounter (Signed)
BMD order faxed to [facility]. Call returned to patient, advised order faxed to [facility], may call to schedule BMD. Patient is aware BMD not due until November 2019. Patient verbalizes understanding and is agreeable.   Appointment written and placed in mail to patient.   Routing to provider for final review. Patient is agreeable to disposition. Will close encounter.

## 2017-12-31 ENCOUNTER — Telehealth: Payer: Self-pay | Admitting: Obstetrics and Gynecology

## 2017-12-31 NOTE — Telephone Encounter (Signed)
Spoke with patient. Patient is asking if BMD is a recommended screening testing or diagnostic testing. Advised this testing is recommended as screening for her bone health every 2 years due to osteoporosis. Is due for next BMD in 09/2018. Patient is agreeable and will call to schedule this with [facility].  Routing to provider for final review. Patient agreeable to disposition. Will close encounter.

## 2017-12-31 NOTE — Telephone Encounter (Signed)
Patient called requesting to speak with the nurse about her bone density test. She'd like to know if it is just a routine test.

## 2018-03-23 ENCOUNTER — Ambulatory Visit
Admission: RE | Admit: 2018-03-23 | Discharge: 2018-03-23 | Disposition: A | Discharge status: Home / Self Care | Payer: BLUE CROSS/BLUE SHIELD | Source: Ambulatory Visit | Attending: Gastroenterology | Admitting: Gastroenterology

## 2018-03-23 DIAGNOSIS — K7460 Unspecified cirrhosis of liver: Secondary | ICD-10-CM

## 2018-03-23 IMAGING — CR DG FOOT COMPLETE 3+V*L*
3 series · 3 of 3 positions shown · non-contrast
Comparison: None.

CLINICAL DATA: Pain following recent fall

EXAM:
LEFT FOOT - COMPLETE 3+ VIEW

[x foot lat left]
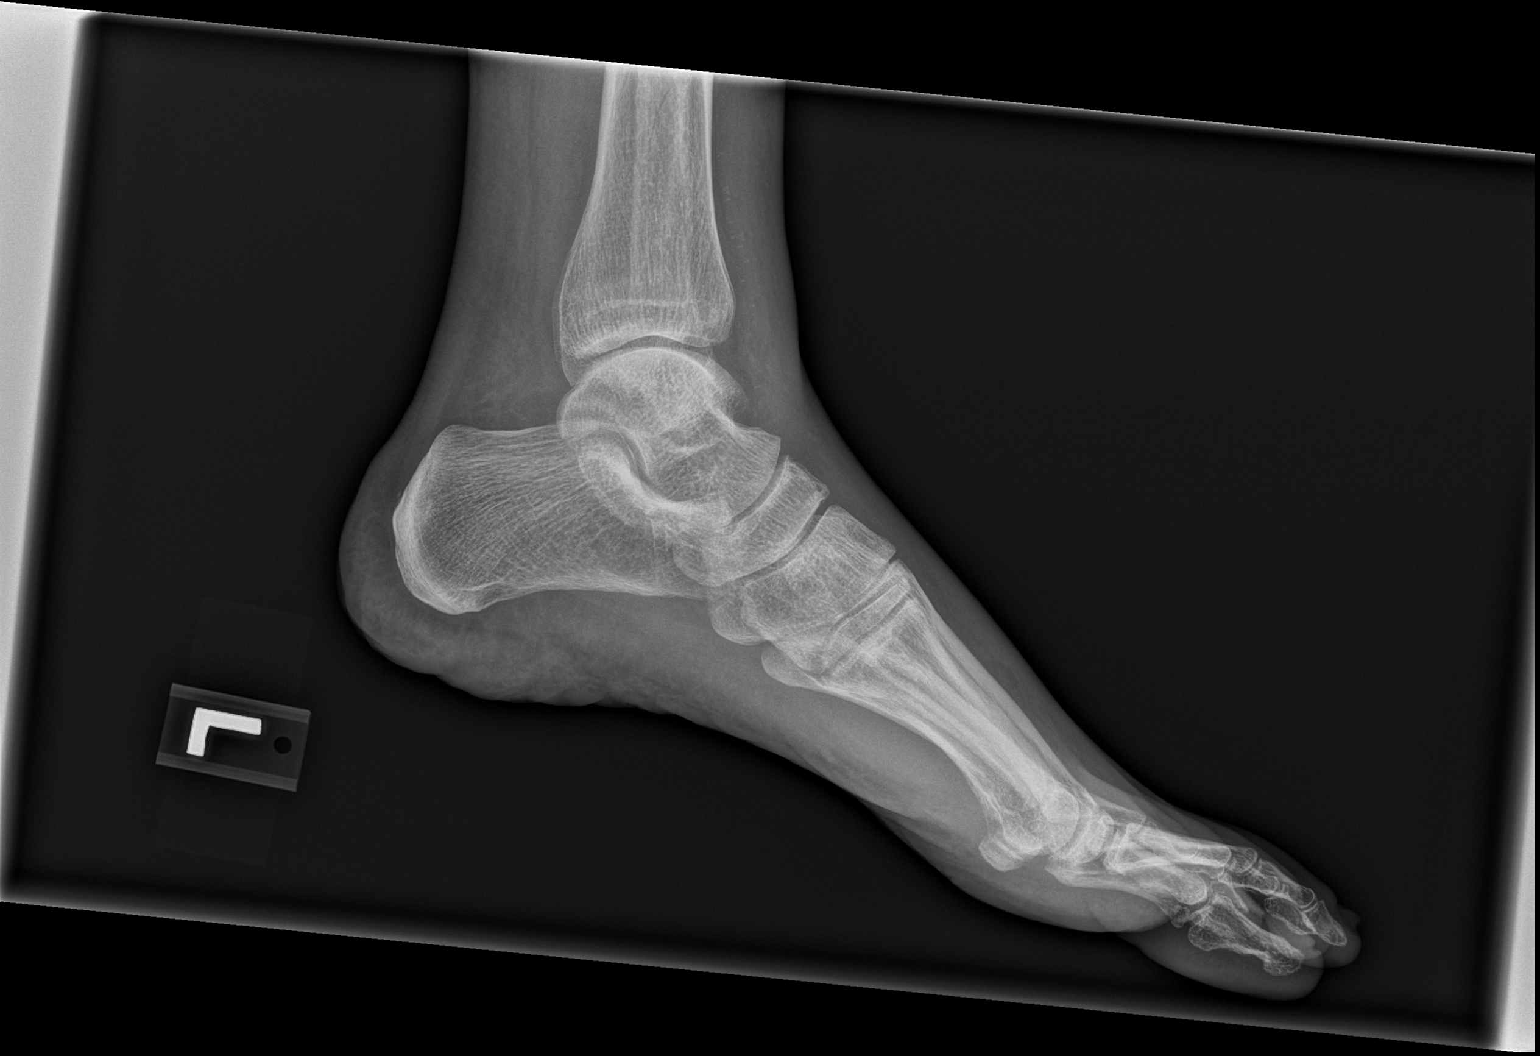

[x foot obl left]
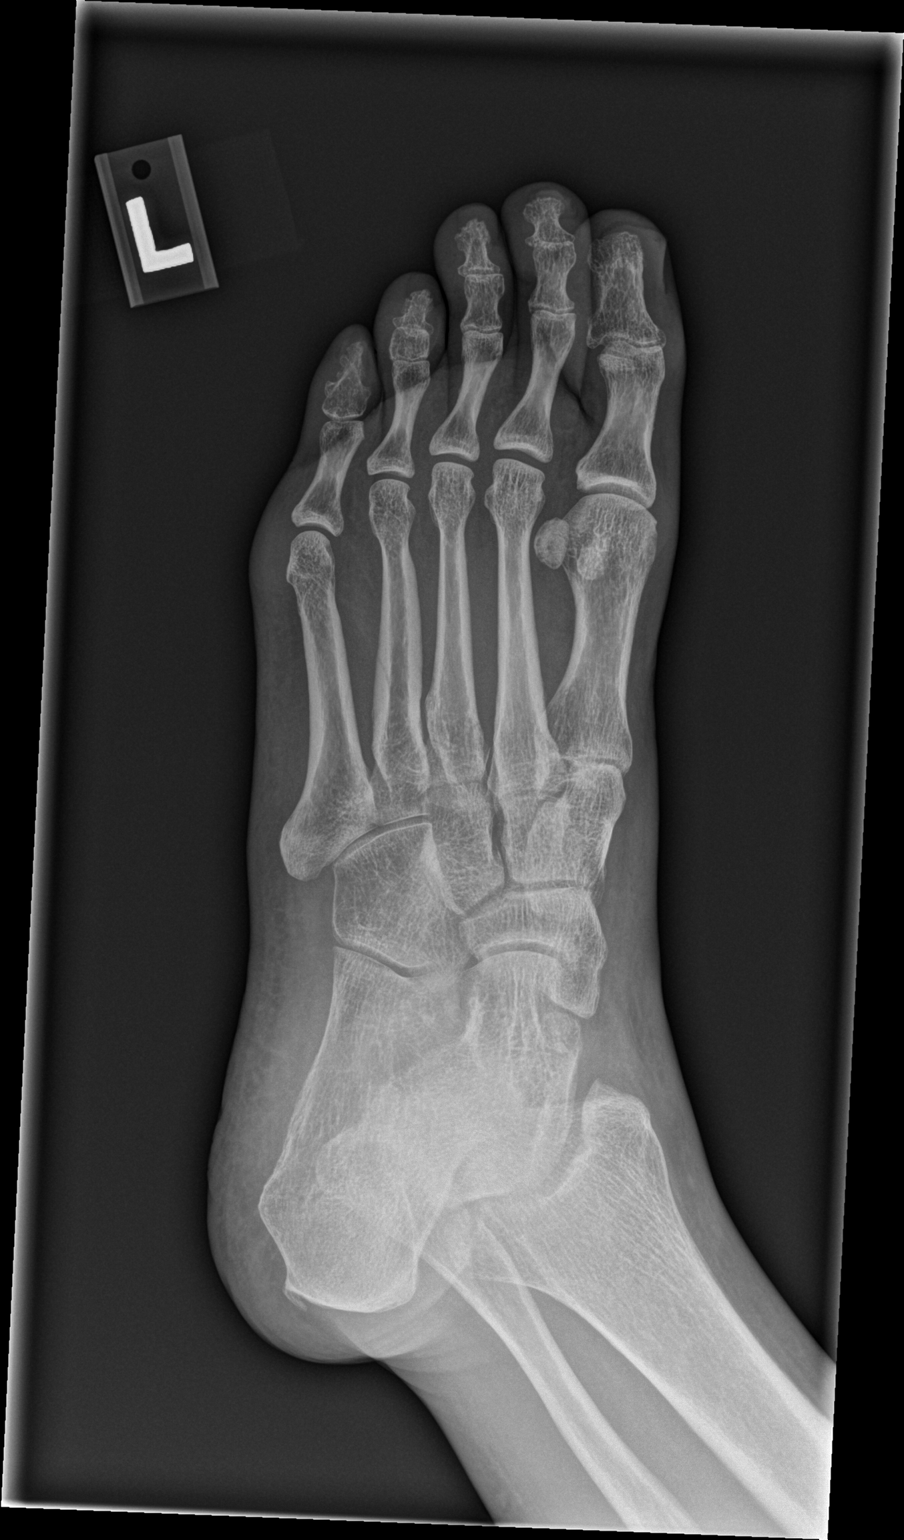

[x foot ap left]
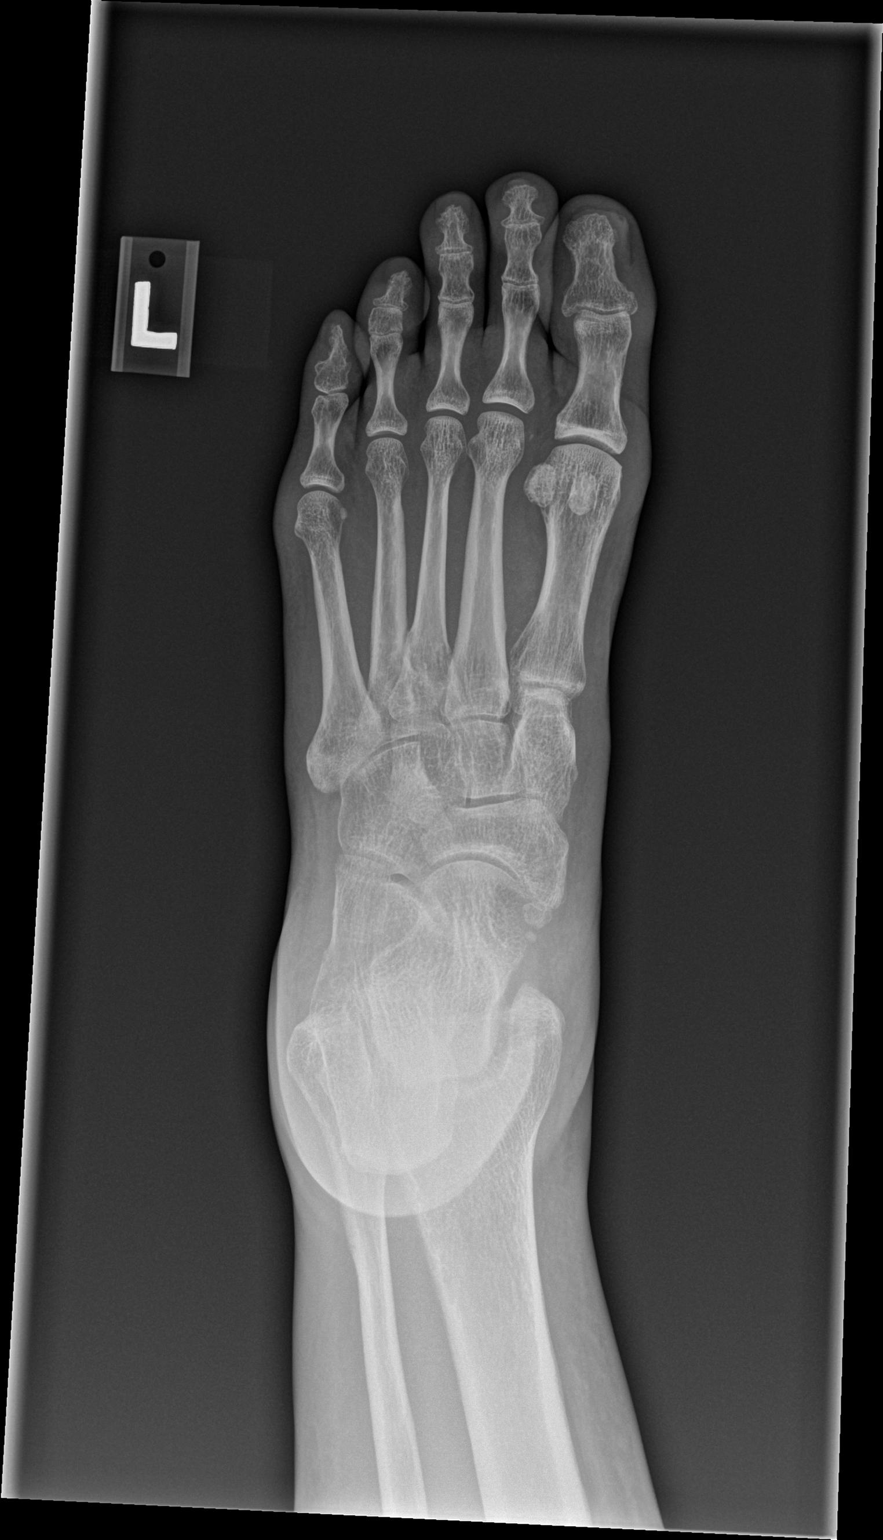

[3 of 3 positions shown; findings below may reference images not displayed]

FINDINGS: Frontal, oblique, and lateral views were obtained. There is an
obliquely oriented fracture of the distal fibular diaphysis. In the
foot region, there is an incomplete fracture along the lateral
fourth metatarsal at the junction of the proximal and mid thirds in
anatomic alignment. There is also a nondisplaced fracture of the
proximal third metatarsal in essentially anatomic alignment. No
other fracture is evident. No dislocation. There is mild narrowing
of all PIP and DIP joints. No erosive change. There is a small os
naviculare, an anatomic variant.
IMPRESSION: Nondisplaced fracture junction of the proximal and mid thirds of the
fourth metatarsal laterally. Nondisplaced fracture proximal third
metatarsal. Fracture of the distal fibula seen only on the oblique
view. Narrowing multiple distal joints.

## 2018-03-23 IMAGING — CR DG ANKLE COMPLETE 3+V*L*
3 series · 3 of 3 positions shown · non-contrast
Comparison: None.

CLINICAL DATA: Pain following fall

EXAM:
LEFT ANKLE COMPLETE - 3+ VIEW

[x ankle ap left]
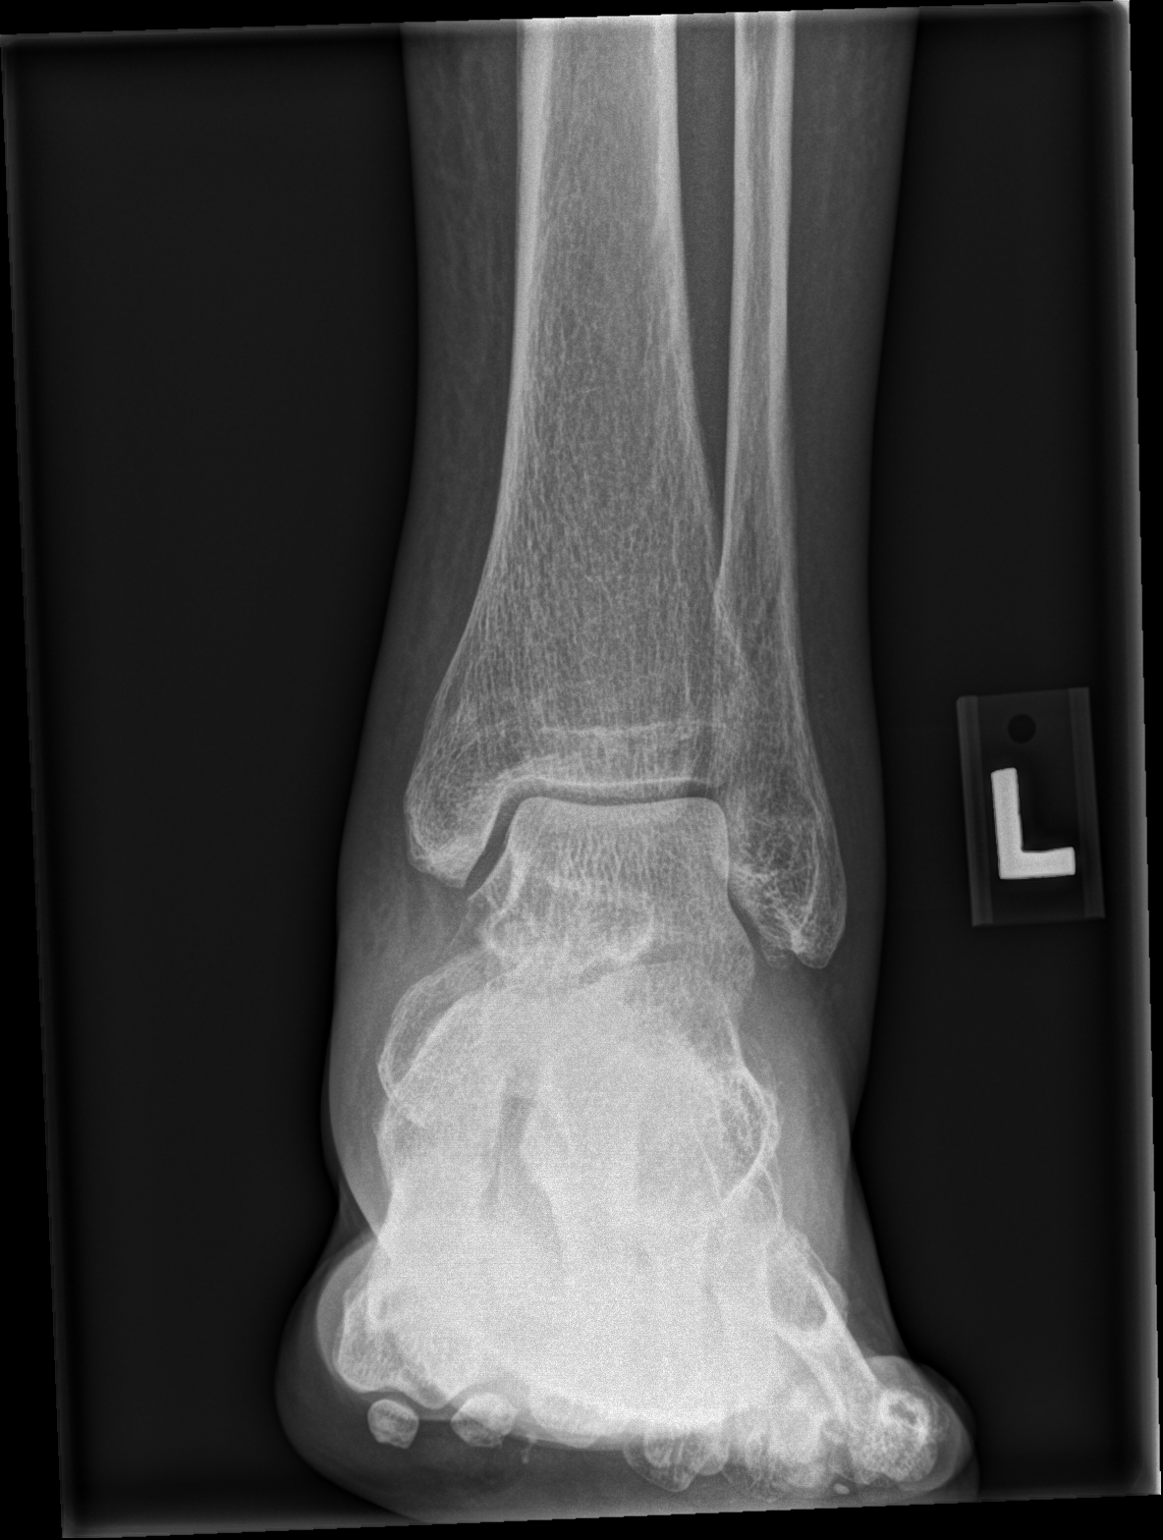

[x ankle obl left]
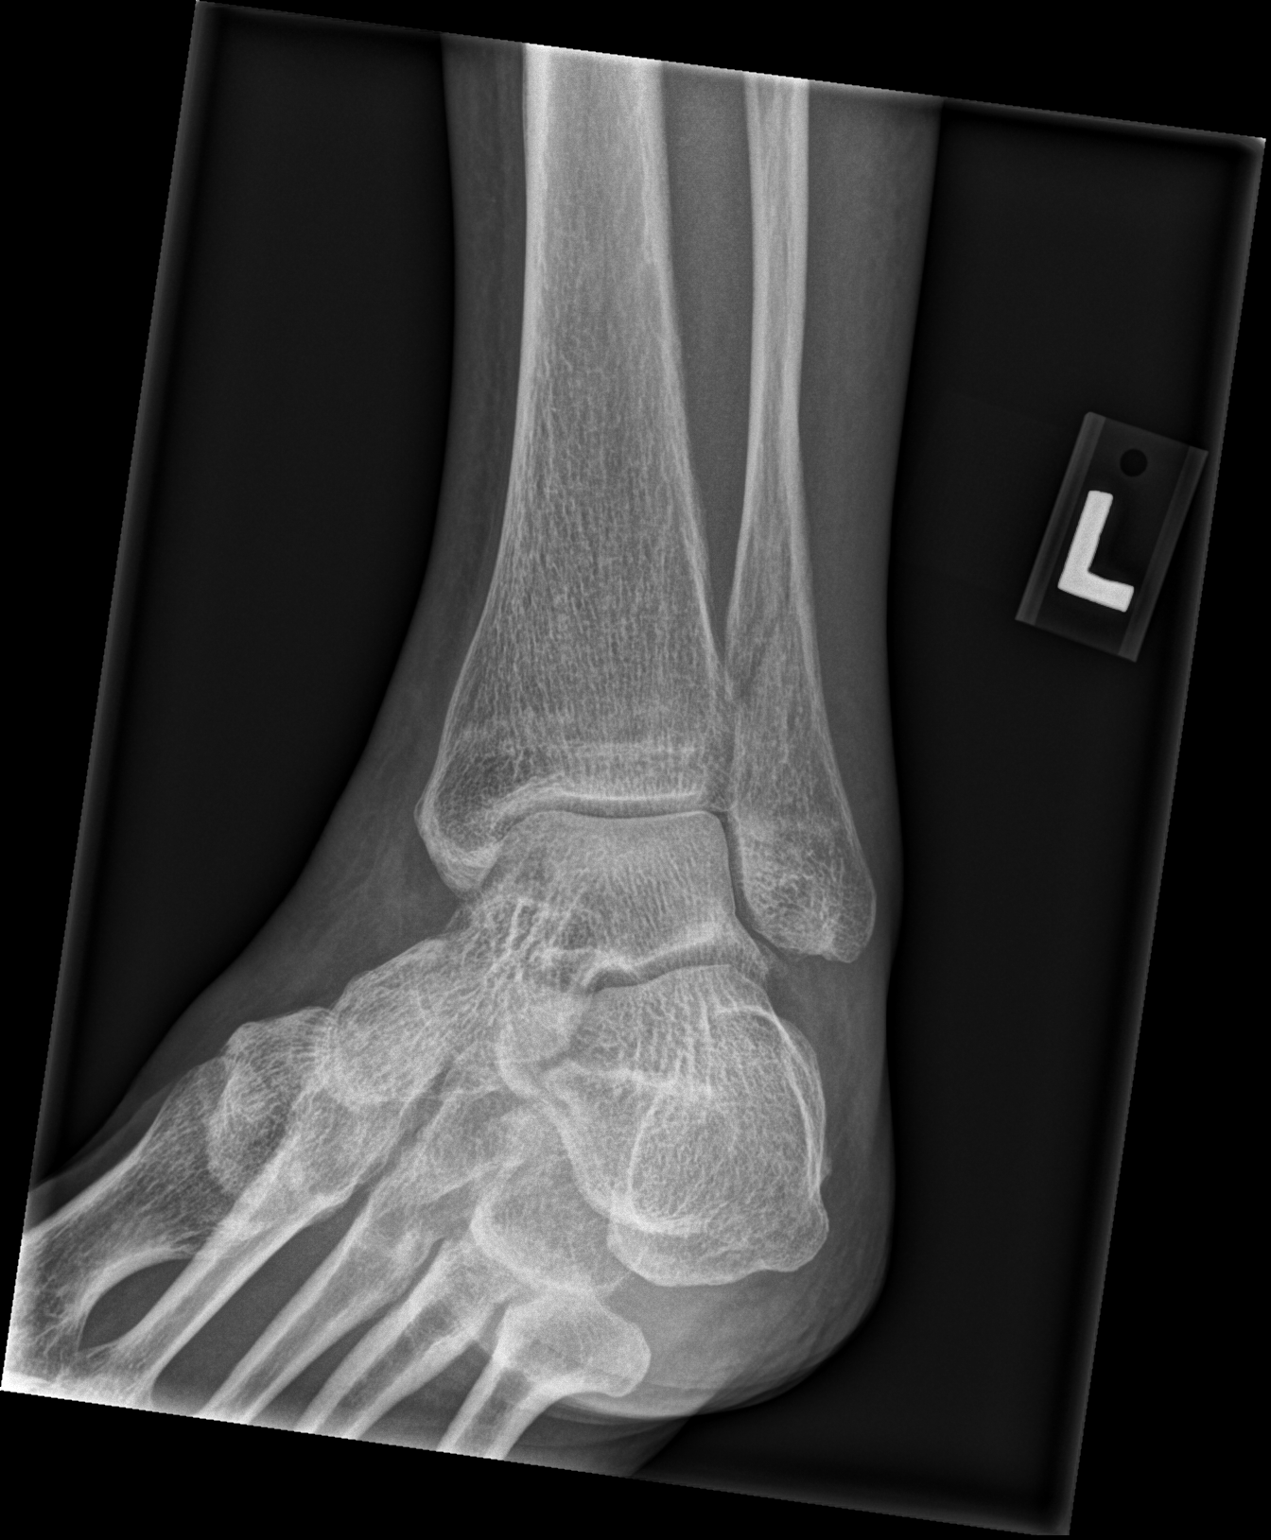

[x ankle lat left]
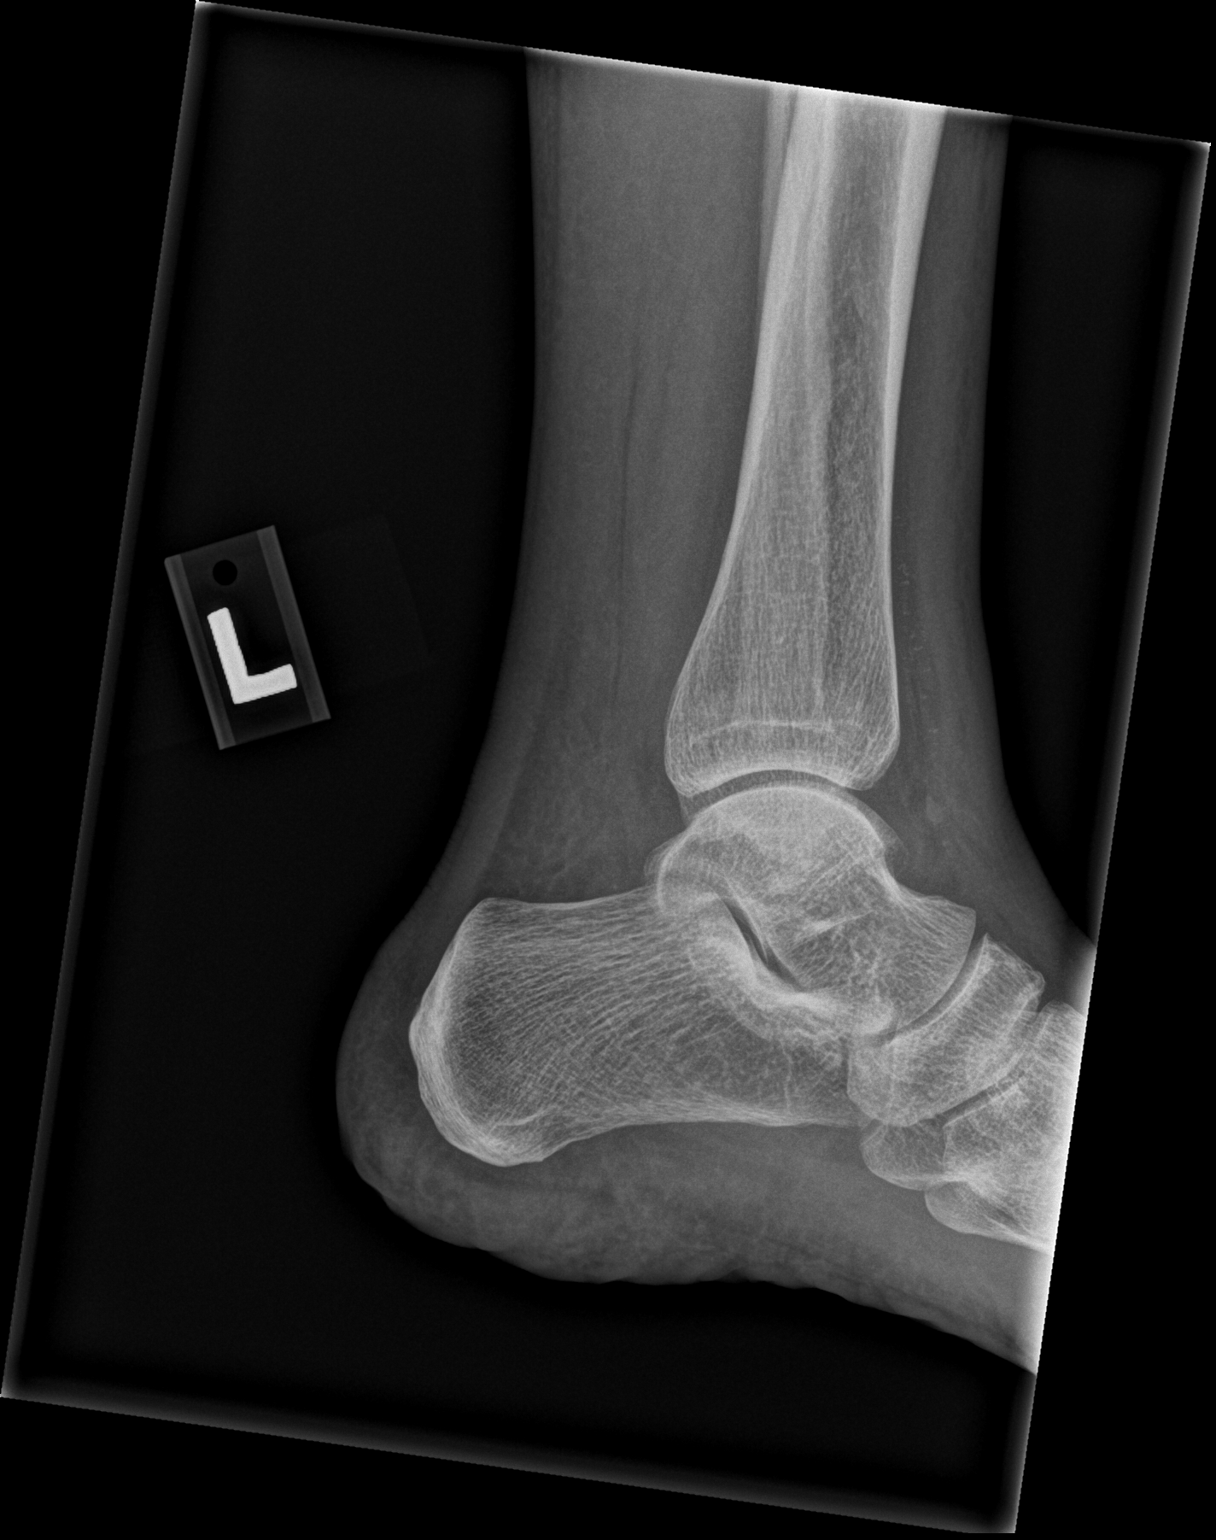

[3 of 3 positions shown; findings below may reference images not displayed]

FINDINGS: Frontal, oblique and lateral views were obtained. There is
generalized soft tissue swelling. There is an obliquely oriented
fracture of the distal fibular diaphysis with alignment essentially
anatomic. No other fracture evident. No joint effusion. The ankle
mortise appears intact. No appreciable joint space narrowing.
IMPRESSION: Nondisplaced obliquely oriented fracture distal fibular diaphysis.
Soft tissue swelling. Ankle mortise appears intact.

## 2018-04-22 ENCOUNTER — Telehealth: Payer: Self-pay | Admitting: Obstetrics and Gynecology

## 2018-04-22 NOTE — Telephone Encounter (Signed)
Left message to call Sharee Pimple at [phone].  Per review of Epic, last BMD 09/23/16 at [facility] showed osteoporosis. Screening recommended in 9yrs. Next BMD due 09/2018

## 2018-04-22 NOTE — Telephone Encounter (Signed)
Probably ok to wait until mid 2020 to do BMD.  We will make the final decision when she comes to see me in November 2019.

## 2018-04-22 NOTE — Telephone Encounter (Signed)
Spoke with patient, advised as seen below per Dr. Silva.  Patient verbalizes understanding and is agreeable.  Encounter closed.  

## 2018-04-22 NOTE — Telephone Encounter (Signed)
Spoke with patient. Patient due for BMD 09/2018, last BMD 09/23/16 Osteoporosis, repeat in 2 yrs.   Patient requesting to repeat BMD mid year 2020, stating she is tired and has a lot of appointments.   Advised patient 09/2018 is recommended and encouraged, ultimately her decision when she repeats BMD. Patient states she wants Dr. Quincy Simmonds to tell her ok to repeat mid year 2020. Again, reviewed recommendations with patient, and her choice to repeat BMD. Patient states she understands, but request Dr. Quincy Simmonds to "give ok". Patient requesting return call. Advised will review with Dr. Quincy Simmonds and return call.  Dr. Quincy Simmonds -please review and advise?

## 2018-04-22 NOTE — Telephone Encounter (Signed)
Patient would like to know how often should she get bone density testing.

## 2018-06-11 ENCOUNTER — Encounter: Payer: Self-pay | Admitting: Neurology

## 2018-06-11 ENCOUNTER — Telehealth: Payer: Self-pay | Admitting: Neurology

## 2018-06-11 ENCOUNTER — Ambulatory Visit: Payer: BLUE CROSS/BLUE SHIELD | Admitting: Neurology

## 2018-06-11 VITALS — BP 105/70 | HR 69 | Ht 62.5 in | Wt 97.4 lb

## 2018-06-11 DIAGNOSIS — R413 Other amnesia: Secondary | ICD-10-CM | POA: Diagnosis not present

## 2018-06-11 DIAGNOSIS — M6250 Muscle wasting and atrophy, not elsewhere classified, unspecified site: Secondary | ICD-10-CM | POA: Diagnosis not present

## 2018-06-11 DIAGNOSIS — Z87898 Personal history of other specified conditions: Secondary | ICD-10-CM

## 2018-06-11 DIAGNOSIS — W19XXXA Unspecified fall, initial encounter: Secondary | ICD-10-CM

## 2018-06-11 DIAGNOSIS — G20 Parkinson's disease: Secondary | ICD-10-CM | POA: Diagnosis not present

## 2018-06-11 DIAGNOSIS — G3281 Cerebellar ataxia in diseases classified elsewhere: Secondary | ICD-10-CM

## 2018-06-11 DIAGNOSIS — R634 Abnormal weight loss: Secondary | ICD-10-CM

## 2018-06-11 NOTE — Progress Notes (Signed)
[facility]    Provider:  Dr Jaynee Eagles Referring Provider: Darcus Austin, MD Primary Care Physician:  Darcus Austin, MD  CC:  weakness  HPI:  Connie Chase is a 72 y.o. female here as a referral from Dr. Inda Merlin for weakness. PMHx prediabetes, afib on Eliquis. Chronic hep b, hypercholesterolemia, CAD, Cirrhosis, weight loss and weakness possibly due to her hepatitis B medication now on Glucerna as well as a high calorie diet. Weakness started 3 years ago, inciting events unknown or none. Weakness started "all over", she noticed it in the legs unable to stand for long periods of time and needed support to walk then needed a walker, hand also started getting weak. Weight loss over the last year. Progressively weakening, no facial weakness, she is having difficulty swallowing or drinks too much she can choke or cough which is more recent. Weakness is symmetric, possibly left is stronger than right but unclear but started bilaterally in the limbs. No muscle twitching. She is urinating more often. No neck or back pain. She has fallen. No pain just weakness. No numbness or tingling. No alcohol use. No family history of muscle disease. Feels like her memory is worsening and she may have dementia. Husband is here and provides information. Husband noticed slowly progressive process, he provides much information. She can't get up by herself. No tremors. No drooling. No other focal neurologic deficits, associated symptoms, inciting events or modifiable factors.  Reviewed notes, labs and imaging from outside physicians, which showed:  Patient's weakness started about 3 years ago, she did not use a cane or walker then, she uses a cane but says she really needs a walker and she does use 1 at home.  She can walk about 5 feet without a cane but needs support of the wall.  She had a left foot fracture in 2017 from tripping and falling it made the weakness worse.  Neuro exam normal at PCP office.  She has  chronic hepatitis B but follows and she is fine from a liver standpoint nevertheless the ultrasound suggests nodular contour of the liver consistent with cirrhosis but her liver enzymes remain in the normal range and she follows with regular ultrasounds.  She has been drinking Glucerna, has muscle weakness may be eating a little bit more, feels as though her weight is a side effect to the medication she is taking for chronic hepatitis B infection. Imbalance.   Labs showed glucose elevated 164 BUN 14 creatinine 0.63 white blood cell 3.3 MCV 101 normal platelets otherwise unremarkable labs drawn April 12, 2018.   Review of Systems: Patient complains of symptoms per HPI as well as the following symptoms: weight loss, fatigue, easy fatigue, weakness, dizziness, insomnia, not enough sleep, decreased energy. . Pertinent negatives and positives per HPI. All others negative.   Social History   Socioeconomic History  . Marital status: Married    Spouse name: Not on file  . Number of children: 2  . Years of education: Not on file  . Highest education level: Bachelor's degree (e.g., BA, AB, BS)  Occupational History  . Not on file  Social Needs  . Financial resource strain: Not on file  . Food insecurity:    Worry: Not on file    Inability: Not on file  . Transportation needs:    Medical: Not on file    Non-medical: Not on file  Tobacco Use  . Smoking status: Never Smoker  . Smokeless tobacco: Never Used  Substance and Sexual  Activity  . Alcohol use: No    Alcohol/week: 0.0 oz  . Drug use: No  . Sexual activity: Never    Partners: Male    Birth control/protection: Other-see comments    Comment: husband had vasectomy   Lifestyle  . Physical activity:    Days per week: Not on file    Minutes per session: Not on file  . Stress: Not on file  Relationships  . Social connections:    Talks on phone: Not on file    Gets together: Not on file    Attends religious service: Not on file     Active member of club or organization: Not on file    Attends meetings of clubs or organizations: Not on file    Relationship status: Not on file  . Intimate partner violence:    Fear of current or ex partner: Not on file    Emotionally abused: Not on file    Physically abused: Not on file    Forced sexual activity: Not on file  Other Topics Concern  . Not on file  Social History Narrative   Lives at home with her husband   Right handed   Regular exercise on a stationary bike   Caffeine: none    Family History  Problem Relation Age of Onset  . Osteoporosis Mother   . Other Unknown        Neg cardiac family Hx  . Neuromuscular disorder Neg Hx     Past Medical History:  Diagnosis Date  . 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 Chronic viral hepatitis B without delta-agent ([place])   . Cirrhosis of liver ([place])   . Fibroid 2004, 2016  . Hemorrhage anterior chamber eye    On pradaxa  . Hepatitis B infection    Chronic Hep B carrier, on suppressive Tx  . Osteoarthritis        . Osteoporosis   . Osteoporosis   . Persistent atrial fibrillation (HCC)    a. s/p DCCV b. intolerant of Flecainide  . Prediabetes     Past Surgical History:  Procedure Laterality Date  . ABLATION  09/06/2015  . CARDIAC ELECTROPHYSIOLOGY MAPPING AND ABLATION  06/2015  . CARDIOVERSION  04/02/2012   Procedure: CARDIOVERSION;  Surgeon: Deboraha Sprang, MD;  Location: [place];  Service: Cardiovascular;  Laterality: N/A;  . CARDIOVERSION N/A 09/12/2013   Procedure: CARDIOVERSION;  Surgeon: Deboraha Sprang, MD;  Location: [place];  Service: Cardiovascular;  Laterality: N/A;  . CARDIOVERSION N/A 01/26/2014   Procedure: CARDIOVERSION;  Surgeon: Deboraha Sprang, MD;  Location: [facility] ENDOSCOPY;  Service: Cardiovascular;  Laterality: N/A;  . CARDIOVERSION N/A 03/30/2015   Procedure: CARDIOVERSION;  Surgeon: Thompson Grayer, MD;  Location: [facility] ENDOSCOPY;  Service: Cardiovascular;  Laterality: N/A;  . CATARACT EXTRACTION  W/ INTRAOCULAR LENS IMPLANT Left 03/2011  . CATARACT EXTRACTION W/ INTRAOCULAR LENS IMPLANT Right 2013  . RADIOFREQUENCY ABLATION      Current Outpatient Medications  Medication Sig Dispense Refill  . apixaban (ELIQUIS) 2.5 MG TABS tablet Take 2.5 mg by mouth 2 (two) times daily.    . beta carotene w/minerals (OCUVITE) tablet Take 1 tablet by mouth daily.     . cholecalciferol (VITAMIN D) 1000 UNITS tablet Take by mouth daily. 2000 units at breakfast and 1000 units at lunch    . diltiazem (CARDIZEM CD) 120 MG 24 hr capsule TAKE 1 CAPSULE (120 MG TOTAL) BY MOUTH DAILY. 60 capsule 0  . diltiazem (  CARDIZEM) 30 MG tablet Take 30 mg by mouth as needed.     . dofetilide (TIKOSYN) 125 MCG capsule Take 1 capsule by mouth daily.    . dorzolamide (TRUSOPT) 2 % ophthalmic solution Place 1 drop into both eyes 2 (two) times daily.     Marland Kitchen entecavir (BARACLUDE) 1 MG tablet Take 1 mg by mouth daily.    Marland Kitchen KLOR-CON M20 20 MEQ tablet TAKE 1 TABLET (20 MEQ TOTAL) BY MOUTH DAILY. 60 tablet 0  . magnesium oxide (MAG-OX) 400 MG tablet Take 400 mg by mouth at bedtime.    . Multiple Vitamins-Minerals (CENTRUM SILVER PO) Take 1 tablet by mouth daily.     . Omega-3 Fatty Acids (OMEGA 3 PO) Take 1 capsule by mouth 2 (two) times daily.     . risedronate (ACTONEL) 150 MG tablet TAKE 1 TABLET EVERY 30 DAYS WITH WATER ON EMPTY STOMACH NOTHING BY MOUTH OR LIE DOWN FOR NEXT 30 MIN 1 tablet 11   No current facility-administered medications for this visit.     Allergies as of 06/11/2018  . (No Known Allergies)    Vitals: BP 105/70 (BP Location: Left Arm, Patient Position: Sitting)   Pulse 69   Ht 5' 2.5" (1.588 m)   Wt 97 lb 6.4 oz (44.2 kg)   LMP 11/10/2006 (Approximate)   BMI 17.53 kg/m  Last Weight:  Wt Readings from Last 1 Encounters:  06/11/18 97 lb 6.4 oz (44.2 kg)   Last Height:   Ht Readings from Last 1 Encounters:  06/11/18 5' 2.5" (1.588 m)    Physical exam: Exam: Gen: NAD, conversant, thin                 CV: RRR, no MRG. No Carotid Bruits. No peripheral edema, warm, nontender Eyes: Conjunctivae clear without exudates or hemorrhage  Neuro: Detailed Neurologic Exam  Speech:    Speech is normal; fluent and spontaneous with normal comprehension.  Cognition:    The patient is oriented to person, place, and time;     recent and remote memory intact;     language fluent;     normal attention, concentration,     fund of knowledge Cranial Nerves: hypomimia    The pupils are equal, round, and reactive to light. Attempted could not visualize fundi.  Visual fields are full to finger confrontation. Extraocular movements are intact. Trigeminal sensation is intact and the muscles of mastication are normal. The face is symmetric. The palate elevates in the midline. Hearing intact. Voice is normal. Shoulder shrug is normal. The tongue has normal motion without fasciculations.   Coordination:    No dysmetria mild tremor on end point  Gait:    Shuffling, stooped, decreased arm swing  Motor Observation:    Generalized atrophy more pronounced in the distal hands and FDI  Tone:    cogwheeling in the uppers   Posture:    stooped    Strength:    Generalized weakness throughout 3+-4+ more pronounced prox lower extremities but diffuse mostly symmetric     Sensation: intact to LT     Reflex Exam:  DTR's: Absent AJs, 1+ patellars and bicepc     Toes:    The toes are equiv bilaterally.   Clonus:    Clonus is absent.      Assessment/Plan:  72 y.o. female here as a referral from Dr. Inda Merlin for weakness. PMHx prediabetes, afib on Eliquis. Chronic hep b, hypercholesterolemia, CAD, Cirrhosis, weight loss and weakness possibly due  to her hepatitis B medication now on Glucerna as well as a high calorie diet. Slowly progressive weakness and parkinsonism on exam. Needs complete workup however suspect a parkinsonian disorder. Ataxia, falls, shuffling, hypomimia, imbalance, generalized weakness,    MRI brain w/wo contrast MRI cervical spine w/ wo contrast Extensive lab testing - they declined Emg/ncs one arm and one leg - declined If above negative recommend DAT Scan Fall risk, discussed prevention  **Patient declined lab testing and emg/ncs at lab, I offered to decrease labs and delete several if cost an issue. They would like to proceed with MRI testing first.  Orders Placed This Encounter  Procedures  . MR BRAIN W WO CONTRAST  . MR CERVICAL SPINE W WO CONTRAST  . Vitamin B1  . Methylmalonic acid, serum  . B. burgdorfi Antibody  . HIV antibody  . Sedimentation rate  . Sjogren's syndrome antibods(ssa + ssb)  . Pan-ANCA  . B12 and Folate Panel  . Heavy metals, blood  . Vitamin B6  . Multiple Myeloma Panel (SPEP&IFE w/QIG)  . Cryoglobulin  . Copper, serum  . ANA  . ANA, IFA (with reflex)  . Basic Metabolic Panel  . CK  . NCV with EMG(electromyography)     Sarina Ill, MD  [facility] [address] [place], [place] [address]  Phone [phone] Fax [phone]

## 2018-06-11 NOTE — Telephone Encounter (Signed)
Patient refused to get her NCV / EMG test scheduled. Patient and her husband state that they only want to do an MRI right now. Patient also refused labs.

## 2018-06-11 NOTE — Patient Instructions (Signed)
MRI brain and MRI cervical spine Labs today EMG/NCS A DAT Scan to examine the parts of the brain that control motor movement   Electromyoneurogram Electromyoneurogram is a test to check how well your muscles and nerves are working. This procedure includes the combined use of electromyogram (EMG) and nerve conduction study (NCS). EMG is used to look for muscular disorders. NCS, which is also called electroneurogram, measures how well your nerves are controlling your muscles. The procedures are usually performed together to check if your muscles and nerves are healthy. If the reaction to testing is abnormal, this can indicate disease or injury, such as peripheral nerve damage. Tell a health care provider about:  Any allergies you have.  All medicines you are taking, including vitamins, herbs, eye drops, creams, and over-the-counter medicines.  Any problems you or family members have had with anesthetic medicines.  Any blood disorders you have.  Any surgeries you have had.  Any medical conditions you have.  Any pacemaker you have. What are the risks? Generally, this is a safe procedure. However, problems may occur, including:  Infection where the electrodes were inserted.  Bleeding.  What happens before the procedure?  Ask your health care provider about: ? Changing or stopping your regular medicines. This is especially important if you are taking diabetes medicines or blood thinners. ? Taking medicines such as aspirin and ibuprofen. These medicines can thin your blood. Do not take these medicines before your procedure if your health care provider instructs you not to.  Your health care provider may ask you to avoid: ? Caffeine, such as coffee and tea. ? Nicotine. This includes cigarettes and anything with tobacco.  Do not use lotions or creams on the same day that you will be having the procedure. What happens during the procedure? For EMG:  Your health care provider will  ask you to stay in a position so that he or she can access the muscle that will be studied. You may be standing, sitting down, or lying down.  You may be given a medicine that numbs the area (local anesthetic).  A very thin needle that has an electrode on it will be inserted into your muscle.  Another small electrode will be placed on your skin near the muscle.  Your health care provider will ask you to continue to remain still.  The electrodes will send a signal that tells about the electrical activity of your muscles. You may see this on a monitor or hear it in the room.  After your muscles have been studied at rest, your health care provider will ask you to contract or flex your muscles. The electrodes will send a signal that tells about the electrical activity of your muscles.  Your health care provider will remove the electrodes and the electrode needles when the procedure is finished. The procedure may vary among health care providers and hospitals. For NCS:  An electrode that records your nerve activity (recording electrode) will be placed on your skin by the muscle that is being studied.  An electrode that is used as a reference (reference electrode) will be placed near the recording electrode.  A paste or gel will be applied to your skin between the recording electrode and the reference electrode.  Your nerve will be stimulated with a mild shock. Your health care provider will measure how much time it takes for your muscle to react.  Your health care provider will remove the electrodes and the gel when the procedure is  finished. The procedure may vary among health care providers and hospitals. What happens after the procedure?  It is your responsibility to obtain your test results. Ask your health care provider or the department performing the test when and how you will get your results.  Your health care provider may: ? Give you medicines for any pain. ? Monitor the  insertion sites to make sure that they stop bleeding. This information is not intended to replace advice given to you by your health care provider. Make sure you discuss any questions you have with your health care provider. Document Released: 02/27/2005 Document Revised: 04/03/2016 Document Reviewed: 12/18/2014 Elsevier Interactive Patient Education  2018 [employer].

## 2018-06-12 NOTE — Telephone Encounter (Signed)
Thank you :)

## 2018-06-14 ENCOUNTER — Encounter: Payer: Self-pay | Admitting: Neurology

## 2018-06-14 DIAGNOSIS — G20 Parkinson's disease: Secondary | ICD-10-CM | POA: Insufficient documentation

## 2018-06-15 ENCOUNTER — Telehealth: Payer: Self-pay | Admitting: Neurology

## 2018-06-15 NOTE — Telephone Encounter (Signed)
[employer] Auth: NPR Ref # [id number] order sent to [facility]. They will reach out to the pt to schedule.

## 2018-07-06 ENCOUNTER — Ambulatory Visit
Admission: RE | Admit: 2018-07-06 | Discharge: 2018-07-06 | Disposition: A | Discharge status: Home / Self Care | Payer: BLUE CROSS/BLUE SHIELD | Source: Ambulatory Visit | Attending: Neurology | Admitting: Neurology

## 2018-07-06 ENCOUNTER — Ambulatory Visit: Payer: BLUE CROSS/BLUE SHIELD | Admitting: Neurology

## 2018-07-06 DIAGNOSIS — W19XXXA Unspecified fall, initial encounter: Secondary | ICD-10-CM

## 2018-07-06 DIAGNOSIS — M6250 Muscle wasting and atrophy, not elsewhere classified, unspecified site: Secondary | ICD-10-CM

## 2018-07-06 DIAGNOSIS — R413 Other amnesia: Secondary | ICD-10-CM

## 2018-07-06 DIAGNOSIS — G3281 Cerebellar ataxia in diseases classified elsewhere: Secondary | ICD-10-CM

## 2018-07-06 DIAGNOSIS — Z87898 Personal history of other specified conditions: Secondary | ICD-10-CM

## 2018-07-06 DIAGNOSIS — R634 Abnormal weight loss: Secondary | ICD-10-CM

## 2018-07-06 DIAGNOSIS — G20 Parkinson's disease: Secondary | ICD-10-CM

## 2018-07-06 DIAGNOSIS — G20C Parkinsonism, unspecified: Secondary | ICD-10-CM

## 2018-07-06 MED ORDER — GADOBENATE DIMEGLUMINE 529 MG/ML IV SOLN
8.0000 mL | Freq: Once | INTRAVENOUS | Status: AC | PRN
  Administered 2018-07-06: 8 mL via INTRAVENOUS

## 2018-09-13 NOTE — Progress Notes (Signed)
72 y.o. G2P2 Married Connie Chase here for annual exam.    Husband is present for the discussion today.   Had vaginal bleeding and spotting 1.5 months ago.  Using pads and noticing spotting.  No pain or cramping.   Prior ultrasound in office 04/03/03 showed 4 fibroids - 2.5 x 3.0 cm, 2.7 x 2.9 cm, 4.1 x 3.7 cm, 1.3 x 1.8 cm. EMS was 8 mm then.  Ovaries not identified.   Had an office  EMB showing benign polyp, prolifrative endometrium with no hyperplasia or malignancy.  Pelvic US for postmenopausal bleeding 09/27/15 Uterus - 2 fibroids. Largest 41 x 37 mm. Smaller is 20 mm. EMS - 2.17 mm. Ovaries - normal. Free fluid - no No EMB done at that time.   Is on Eliquis for atrial fibrillation.   Has constipation and strains to have BMs.   Taking Actonel for osteoporosis.   PCP:   Jonathon Jordan, MD  Patient's last menstrual period was 11/10/2006 (approximate).           Sexually active: No.  The current method of family planning is vasectomy/postmenopausal.   Exercising: Yes.    Stationary bike  Smoker:  no  Health Maintenance: Pap: 09-02-17 Neg, 08-08-15 Neg History of abnormal Pap:  no MMG:  07-21-14 density D/33mm asymmetry Rt.Br.--diag.Rt.MMG Neg/Birads2 Colonoscopy:  05/21/2001  BMD: 09-23-16 Result :Osteoporosis TDaP:  no Gardasil:   no HIV: --- Hep C:  --- Screening Labs: Urine today: none   reports that she has never smoked. She has never used smokeless tobacco. She reports that she does not drink alcohol or use drugs.  Past Medical History:  Diagnosis Date  . 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 Chronic viral hepatitis B without delta-agent ([place])   . Cirrhosis of liver ([place])   . Fibroid 2004, 2016  . Hemorrhage anterior chamber eye    On pradaxa  . Hepatitis B infection    Chronic Hep B carrier, on suppressive Tx  . Osteoarthritis        . Osteoporosis   . Osteoporosis   . Persistent atrial fibrillation ([facility])    a. s/p DCCV b. intolerant of Flecainide   . Prediabetes     Past Surgical History:  Procedure Laterality Date  . ABLATION  09/06/2015  . CARDIAC ELECTROPHYSIOLOGY MAPPING AND ABLATION  06/2015  . CARDIOVERSION  04/02/2012   Procedure: CARDIOVERSION;  Surgeon: Deboraha Sprang, MD;  Location: [place];  Service: Cardiovascular;  Laterality: N/A;  . CARDIOVERSION N/A 09/12/2013   Procedure: CARDIOVERSION;  Surgeon: Deboraha Sprang, MD;  Location: [place];  Service: Cardiovascular;  Laterality: N/A;  . CARDIOVERSION N/A 01/26/2014   Procedure: CARDIOVERSION;  Surgeon: Deboraha Sprang, MD;  Location: [facility] ENDOSCOPY;  Service: Cardiovascular;  Laterality: N/A;  . CARDIOVERSION N/A 03/30/2015   Procedure: CARDIOVERSION;  Surgeon: Thompson Grayer, MD;  Location: [facility] ENDOSCOPY;  Service: Cardiovascular;  Laterality: N/A;  . CATARACT EXTRACTION W/ INTRAOCULAR LENS IMPLANT Left 03/2011  . CATARACT EXTRACTION W/ INTRAOCULAR LENS IMPLANT Right 2013  . RADIOFREQUENCY ABLATION      Current Outpatient Medications  Medication Sig Dispense Refill  . apixaban (ELIQUIS) 2.5 MG TABS tablet Take 2.5 mg by mouth 2 (two) times daily.    . beta carotene w/minerals (OCUVITE) tablet Take 1 tablet by mouth daily.     . cholecalciferol (VITAMIN D) 1000 UNITS tablet Take by mouth daily. 2000 units at breakfast and 1000 units at lunch    . diltiazem (  CARDIZEM CD) 120 MG 24 hr capsule TAKE 1 CAPSULE (120 MG TOTAL) BY MOUTH DAILY. 60 capsule 0  . diltiazem (CARDIZEM) 30 MG tablet Take 30 mg by mouth as needed.     . dofetilide (TIKOSYN) 125 MCG capsule Take 1 capsule by mouth daily.    . dorzolamide (TRUSOPT) 2 % ophthalmic solution Place 1 drop into both eyes 2 (two) times daily.     Marland Kitchen entecavir (BARACLUDE) 1 MG tablet Take 1 mg by mouth daily.    Marland Kitchen KLOR-CON M20 20 MEQ tablet TAKE 1 TABLET (20 MEQ TOTAL) BY MOUTH DAILY. 60 tablet 0  . magnesium oxide (MAG-OX) 400 MG tablet Take 400 mg by mouth at bedtime.    . Multiple Vitamins-Minerals (CENTRUM SILVER PO) Take 1  tablet by mouth daily.     . Omega-3 Fatty Acids (OMEGA 3 PO) Take 1 capsule by mouth 2 (two) times daily.     . risedronate (ACTONEL) 150 MG tablet TAKE 1 TABLET EVERY 30 DAYS WITH WATER ON EMPTY STOMACH NOTHING BY MOUTH OR LIE DOWN FOR NEXT 30 MIN 1 tablet 11   No current facility-administered medications for this visit.     Family History  Problem Relation Age of Onset  . Osteoporosis Mother   . Other Unknown        Neg cardiac family Hx  . Neuromuscular disorder Neg Hx     Review of Systems  Gastrointestinal: Positive for constipation.  Genitourinary:       Abnormal bleeding  unscheduled bleeding Night urination   Neurological: Positive for weakness.    Exam:   BP 108/62   Pulse 64   Temp 98.7 F (37.1 C)   Resp 16   Ht 5' 2.5" (1.588 m)   Wt 97 lb 12.8 oz (44.4 kg)   LMP 11/10/2006 (Approximate)   BMI 17.60 kg/m     General appearance: alert, cooperative and appears stated age Head: Normocephalic, without obvious abnormality, atraumatic Neck: no adenopathy, supple, symmetrical, trachea midline and thyroid normal to inspection and palpation Lungs: clear to auscultation bilaterally Breasts: normal appearance, no masses or tenderness, No nipple retraction or dimpling, No nipple discharge or bleeding, No axillary or supraclavicular adenopathy Heart: regular rate and rhythm Abdomen: soft, non-tender; no masses, no organomegaly Extremities: extremities normal, atraumatic, no cyanosis or edema Skin: Skin color, texture, turgor normal. No rashes or lesions Lymph nodes: Cervical, supraclavicular, and axillary nodes normal. No abnormal inguinal nodes palpated Neurologic: Grossly normal  Pelvic: External genitalia:  no lesions              Urethra:  normal appearing urethra with no masses, tenderness or lesions              Bartholins and Skenes: normal                 Vagina: normal appearing vagina with normal color and discharge, no lesions              Cervix: no  lesions              Pap taken: Yes.   Bimanual Exam:  Uterus:  normal size, contour, position, consistency, mobility, non-tender              Adnexa: no mass, fullness, tenderness              Rectal exam: Yes.  .  Confirms.              Anus:  normal sphincter tone, no lesions.  Stool in the rectal vault.  Chaperone was present for exam.  Assessment:   Well woman visit with normal exam. Postmenopausal bleeding. Osteoporosis.  On Actonel.  Chronic hepatitis B with cirrhosis.  Atrial fibrillation.  On Eliquis.   Plan: Declines mammogram screening. Unable to do examination due to limited ability to stand.  Recommended self breast awareness. Pap and HR HPV as above. Guidelines for Calcium, Vitamin D, regular exercise program including cardiovascular and weight bearing exercise. Refill of Actonel for one year.  BMD due this month.  Needs pelvic US and possible EMB, but will need to stop Eliquis first.  Follow up annually and prn.   After visit summary provided.

## 2018-09-14 ENCOUNTER — Telehealth: Payer: Self-pay | Admitting: Obstetrics and Gynecology

## 2018-09-14 DIAGNOSIS — N950 Postmenopausal bleeding: Secondary | ICD-10-CM

## 2018-09-14 NOTE — Telephone Encounter (Signed)
Reviewed with Dr. Quincy Simmonds.  PUS today, keep AEX 09/15/18. Patient is on eliquis, will need to stop prior to EMB.   Call returned to patient. Patient declined PUS for today at 2pm. Patient does not have a ride. Patient will keep AEX as scheduled for 09/15/18 at 10:30am with Dr. Quincy Simmonds, will assist with scheduling PUS and EMB while in office.   Orders placed for EMB and PUS fpr precert.   Routing to Dr. Quincy Simmonds.

## 2018-09-14 NOTE — Telephone Encounter (Signed)
Thank you for the update!

## 2018-09-14 NOTE — Telephone Encounter (Signed)
Patient called and reported some vaginal bleeding she is concerned about. She has an AEX scheduled with Dr. Quincy Simmonds tomorrow.  Last seen: 09/02/17

## 2018-09-14 NOTE — Telephone Encounter (Signed)
Spoke with patient. Patient reports intermittent spotting that started "couple of months ago". Denies pain, urinary symptoms, fever/chills, N/V.   Hx of fibroids, last PUS 09/27/2015 Patient is scheduled for AEX tomorrow at 10:30am.   Advised patient to keep AEX as scheduled, will review plan of care with Dr. Quincy Simmonds and return call. Patient is agreeable.

## 2018-09-14 NOTE — Telephone Encounter (Signed)
Encounter closed

## 2018-09-15 ENCOUNTER — Ambulatory Visit (INDEPENDENT_AMBULATORY_CARE_PROVIDER_SITE_OTHER): Payer: BLUE CROSS/BLUE SHIELD | Admitting: Obstetrics and Gynecology

## 2018-09-15 ENCOUNTER — Other Ambulatory Visit: Payer: Self-pay

## 2018-09-15 ENCOUNTER — Encounter: Payer: Self-pay | Admitting: Obstetrics and Gynecology

## 2018-09-15 ENCOUNTER — Ambulatory Visit: Payer: BLUE CROSS/BLUE SHIELD | Admitting: Obstetrics and Gynecology

## 2018-09-15 ENCOUNTER — Other Ambulatory Visit (HOSPITAL_COMMUNITY)
Admission: RE | Admit: 2018-09-15 | Discharge: 2018-09-15 | Disposition: A | Discharge status: Home / Self Care | Payer: BLUE CROSS/BLUE SHIELD | Source: Ambulatory Visit | Attending: Obstetrics and Gynecology | Admitting: Obstetrics and Gynecology

## 2018-09-15 ENCOUNTER — Telehealth: Payer: Self-pay | Admitting: Obstetrics and Gynecology

## 2018-09-15 VITALS — BP 108/62 | HR 64 | Temp 98.7°F | Resp 16 | Ht 62.5 in | Wt 97.8 lb

## 2018-09-15 DIAGNOSIS — N950 Postmenopausal bleeding: Secondary | ICD-10-CM | POA: Diagnosis not present

## 2018-09-15 DIAGNOSIS — Z01419 Encounter for gynecological examination (general) (routine) without abnormal findings: Secondary | ICD-10-CM | POA: Diagnosis present

## 2018-09-15 DIAGNOSIS — M810 Age-related osteoporosis without current pathological fracture: Secondary | ICD-10-CM | POA: Diagnosis not present

## 2018-09-15 MED ORDER — RISEDRONATE SODIUM 150 MG PO TABS: ORAL_TABLET | ORAL | 11 refills | Status: DC

## 2018-09-15 NOTE — Telephone Encounter (Signed)
Order faxed to [facility]   Call to patient to notify, left detailed message, ok per dpr. Advised new order for BMD sent to [facility], may call them directly at [phone] to schedule. Return call to office if any additional questions.   Encounter closed.

## 2018-09-15 NOTE — Telephone Encounter (Signed)
[facility] called and said that patient goes to [facility]. Stated that Bone Density order would need to be changed to [facility].

## 2018-09-15 NOTE — Telephone Encounter (Signed)
BMD order cancelled at [facility]. Last BMD at [facility] on 09/23/16: Osteoporosis   Written order for BMD for Connie Chase to Dr. Quincy Simmonds for signature.

## 2018-09-15 NOTE — Patient Instructions (Signed)

## 2018-09-15 NOTE — Progress Notes (Signed)
Spoke with patient and spouse while in office. Patient scheduled for Pelvic US Complete with transvaginal on 09/20/18 at 9:30am, arrive at 9:10am at [facility] [place] location. Patient declines PUS in office, requesting morning appointment. Advised patient to arrive with full bladder, drink 32oz of fluid 1 hour prior to appt. Patient declines to schedule BMD, order placed for [facility], to schedule after 09/23/18. Patient states she will call directly to schedule. Patient and spouse verbalize understanding and is agreeable.

## 2018-09-17 LAB — CYTOLOGY - PAP: Diagnosis: NEGATIVE

## 2018-09-20 ENCOUNTER — Ambulatory Visit
Admission: RE | Admit: 2018-09-20 | Discharge: 2018-09-20 | Disposition: A | Discharge status: Home / Self Care | Payer: BLUE CROSS/BLUE SHIELD | Source: Ambulatory Visit | Attending: Obstetrics and Gynecology | Admitting: Obstetrics and Gynecology

## 2018-09-20 ENCOUNTER — Telehealth: Payer: Self-pay | Admitting: *Deleted

## 2018-09-20 DIAGNOSIS — N950 Postmenopausal bleeding: Secondary | ICD-10-CM

## 2018-09-20 NOTE — Telephone Encounter (Signed)
Notes recorded by Burnice Logan, RN on 09/20/2018 at 4:02 PM EST Left message to call Sharee Pimple, RN at [facility].   ------

## 2018-09-20 NOTE — Telephone Encounter (Signed)
-----   Message from Nunzio Cobbs, MD sent at 09/20/2018 12:40 PM EST ----- Please report results of pelvic ultrasound to patient.  She had postmenopausal bleeding and she is on Eliquis. Her uterus shows 2 fibroids that are stable in size.  Her lining, the endometrium, is thin and normal.  Her right ovary was not seen, due to bowel in the area.  Her left ovary is normal.   No further evaluation of her bleeding is needed at this time.  If the bleeding recurs, please have her call the office.

## 2018-09-22 NOTE — Telephone Encounter (Signed)
Spoke with patient, advised as seen below per Dr. Quincy Simmonds. Patient is scheduled for BMD 11/19 at [facility]. Patient verbalizes understanding and is agreeable.

## 2018-09-22 NOTE — Telephone Encounter (Signed)
Spoke with patients spouse, Korynn Kenedy. Patient is at an appointment, will return call to office later today.

## 2018-10-08 ENCOUNTER — Telehealth: Payer: Self-pay | Admitting: Obstetrics and Gynecology

## 2018-10-08 NOTE — Telephone Encounter (Signed)
Please report results of BMD from [facility] from 09/28/18.   She now has osteopenia of both hips and a normal spine bone density.   She had significant improvement in her bone density of the left hip and spine.  Her right hip is stable.   I recommend she continue on the Actonel, her MVI, and vitamin D.   Will recheck her BMD in 2 years.

## 2018-10-12 NOTE — Telephone Encounter (Signed)
Left message to call Tamber Burtch, RN at [facility] [phone].   

## 2018-10-12 NOTE — Telephone Encounter (Signed)
Spoke with patients husband, Cindie Crumbly, ok per dpr. Advised as seen below per Dr. Quincy Simmonds. Spouse verbalizes understanding and is agreeable.   BMD results to scan.   Encounter closed.

## 2018-11-29 ENCOUNTER — Other Ambulatory Visit: Payer: Self-pay | Admitting: Gastroenterology

## 2018-11-29 DIAGNOSIS — B181 Chronic viral hepatitis B without delta-agent: Secondary | ICD-10-CM

## 2019-03-01 ENCOUNTER — Other Ambulatory Visit: Payer: BLUE CROSS/BLUE SHIELD

## 2019-07-20 ENCOUNTER — Telehealth: Payer: Self-pay | Admitting: Obstetrics and Gynecology

## 2019-07-20 NOTE — Telephone Encounter (Signed)
Left message on voicemail to call and reschedule cancelled appointment. °

## 2019-09-28 ENCOUNTER — Ambulatory Visit: Payer: BLUE CROSS/BLUE SHIELD | Admitting: Obstetrics and Gynecology

## 2019-09-30 ENCOUNTER — Ambulatory Visit: Payer: BLUE CROSS/BLUE SHIELD | Admitting: Obstetrics and Gynecology

## 2019-12-31 ENCOUNTER — Ambulatory Visit: Payer: BC Managed Care – PPO | Attending: Internal Medicine

## 2019-12-31 DIAGNOSIS — Z23 Encounter for immunization: Secondary | ICD-10-CM

## 2019-12-31 NOTE — Progress Notes (Signed)
   Covid-19 Vaccination Clinic  Name:  Connie Chase    MRN: [id number] DOB: 03-19-1946  12/31/2019  [name] was observed post Covid-19 immunization for 15 minutes without incidence. She was provided with Vaccine Information Sheet and instruction to access the V-Safe system.   [name] was instructed to call 911 with any severe reactions post vaccine: Marland Kitchen Difficulty breathing  . Swelling of your face and throat  . A fast heartbeat  . A bad rash all over your body  . Dizziness and weakness    Immunizations Administered    Name Date Dose VIS Date Route   Pfizer COVID-19 Vaccine 12/31/2019  2:09 PM 0.3 mL 10/21/2019 Intramuscular   Manufacturer: [place]   Lot: [id number]   [place]: [id number]

## 2020-01-11 ENCOUNTER — Other Ambulatory Visit: Payer: Self-pay | Admitting: Obstetrics and Gynecology

## 2020-01-11 NOTE — Telephone Encounter (Signed)
Medication refill request: Risedronate Last AEX:  09-02-17 BS Next AEX: 10-15-20 Last MMG (if hormonal medication request): n/a Refill authorized: Today, please advise.   Spoke with patient's husband, Nancy Fetter, okay per DPR. Advised patient needs to schedule aex, Nancy Fetter declines to schedule prior to December stating wife is concerned about coming into office during pandemic. Annual exam scheduled for 10-15-20 at 1400. Agreeable to date and time of appointment. Advised would send request to Dr. Quincy Simmonds. Patient's husband agreeable to date and time of appointment.   Medication pended for #1, 8 RF. Please refill if appropriate.

## 2020-01-11 NOTE — Telephone Encounter (Signed)
Patient's husband is calling to request a refill for risedronate. Pharmacy on file confirmed as [facility] [address].

## 2020-01-12 MED ORDER — RISEDRONATE SODIUM 150 MG PO TABS
ORAL_TABLET | ORAL | 8 refills | Status: DC
Start: 2020-01-12 — End: 2020-10-15

## 2020-01-23 ENCOUNTER — Ambulatory Visit: Payer: BC Managed Care – PPO | Attending: Internal Medicine

## 2020-01-23 DIAGNOSIS — Z23 Encounter for immunization: Secondary | ICD-10-CM

## 2020-01-23 NOTE — Progress Notes (Signed)
   Covid-19 Vaccination Clinic  Name:  Connie Chase    MRN: [id number] DOB: [birth year]  01/23/2020  [name] was observed post Covid-19 immunization for 15 minutes without incident. She was provided with Vaccine Information Sheet and instruction to access the V-Safe system.   [name] was instructed to call 911 with any severe reactions post vaccine: Marland Kitchen Difficulty breathing  . Swelling of face and throat  . A fast heartbeat  . A bad rash all over body  . Dizziness and weakness   Immunizations Administered    Name Date Dose VIS Date Route   Pfizer COVID-19 Vaccine 01/23/2020  2:17 PM 0.3 mL 10/21/2019 Intramuscular   Manufacturer: [place]   Lot: [id number]   [place]: [id number]

## 2020-09-08 ENCOUNTER — Ambulatory Visit: Payer: BC Managed Care – PPO | Attending: Internal Medicine

## 2020-09-08 ENCOUNTER — Ambulatory Visit: Payer: BC Managed Care – PPO

## 2020-09-08 DIAGNOSIS — Z23 Encounter for immunization: Secondary | ICD-10-CM

## 2020-09-08 NOTE — Progress Notes (Signed)
° °  Covid-19 Vaccination Clinic  Name:  Connie Chase    MRN: [id number] DOB: [birth year]  09/08/2020  Connie Chase was observed post Covid-19 immunization for 15 minutes without incident. She was provided with Vaccine Information Sheet and instruction to access the V-Safe system.   Connie Chase was instructed to call 911 with any severe reactions post vaccine:  Difficulty breathing   Swelling of face and throat   A fast heartbeat   A bad rash all over body   Dizziness and weakness

## 2020-10-11 NOTE — Progress Notes (Signed)
74 y.o. G2P2 Married [place] female here for annual exam.    Seeing physicians at [facility] for muscle weakness. Uses a walker at home.  Using a wheel chair today in the office.   Denies vaginal bleeding.  Not sexually active.   Drinks Glucerna.   Did her booster for Covid.  Did her flu vaccine.   PCP:  Jonathon Jordan, MD  Patient's last menstrual period was 11/10/2006 (approximate).           Sexually active: No.  The current method of family planning is vasectomy/PMP.    Exercising: Yes.    stationary bik Smoker:  no  Health Maintenance: Pap: 09-15-18 Neg, 09-02-17 Neg, 08-08-15 Neg History of abnormal Pap:  no MMG:  07-21-14 density D/47mm asymmetry Rt.Br.--diag.Rt.MMG Neg/Birads2--patient declines--too painful Colonoscopy:  05/21/2001  BMD: 09-28-18  Result :Improved to Osteopenia from Osteoporosis TDaP: PCP Gardasil:   no HIV: no Hep C:no Screening Labs:  Today.    reports that she has never smoked. She has never used smokeless tobacco. She reports that she does not drink alcohol and does not use drugs.  Past Medical History:  Diagnosis Date  . 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 Chronic viral hepatitis B without delta-agent ([place])   . Cirrhosis of liver ([place])   . Fibroid 2004, 2016  . Hemorrhage anterior chamber eye    On pradaxa  . Hepatitis B infection    Chronic Hep B carrier, on suppressive Tx  . Osteoarthritis        . Osteoporosis   . Osteoporosis   . Persistent atrial fibrillation (HCC)    a. s/p DCCV b. intolerant of Flecainide  . Prediabetes     Past Surgical History:  Procedure Laterality Date  . ABLATION  09/06/2015  . CARDIAC ELECTROPHYSIOLOGY MAPPING AND ABLATION  06/2015  . CARDIOVERSION  04/02/2012   Procedure: CARDIOVERSION;  Surgeon: Deboraha Sprang, MD;  Location: [place];  Service: Cardiovascular;  Laterality: N/A;  . CARDIOVERSION N/A 09/12/2013   Procedure: CARDIOVERSION;  Surgeon: Deboraha Sprang, MD;  Location: [place];   Service: Cardiovascular;  Laterality: N/A;  . CARDIOVERSION N/A 01/26/2014   Procedure: CARDIOVERSION;  Surgeon: Deboraha Sprang, MD;  Location: [facility] ENDOSCOPY;  Service: Cardiovascular;  Laterality: N/A;  . CARDIOVERSION N/A 03/30/2015   Procedure: CARDIOVERSION;  Surgeon: Thompson Grayer, MD;  Location: [facility] ENDOSCOPY;  Service: Cardiovascular;  Laterality: N/A;  . CATARACT EXTRACTION W/ INTRAOCULAR LENS IMPLANT Left 03/2011  . CATARACT EXTRACTION W/ INTRAOCULAR LENS IMPLANT Right 2013  . RADIOFREQUENCY ABLATION      Current Outpatient Medications  Medication Sig Dispense Refill  . apixaban (ELIQUIS) 2.5 MG TABS tablet Take 2.5 mg by mouth 2 (two) times daily.    Marland Kitchen b complex vitamins capsule Take 1 capsule by mouth daily.    . beta carotene w/minerals (OCUVITE) tablet Take 1 tablet by mouth daily.     . carbidopa-levodopa (SINEMET IR) 25-100 MG tablet Take 1 tablet by mouth 3 (three) times daily.    . cholecalciferol (VITAMIN D) 1000 UNITS tablet Take by mouth daily. 2000 units at breakfast and 1000 units at lunch    . diltiazem (CARDIZEM CD) 120 MG 24 hr capsule TAKE 1 CAPSULE (120 MG TOTAL) BY MOUTH DAILY. 60 capsule 0  . diltiazem (CARDIZEM) 30 MG tablet Take 30 mg by mouth as needed.     . dofetilide (TIKOSYN) 125 MCG capsule Take 1 capsule by mouth daily.    . dorzolamide (  TRUSOPT) 2 % ophthalmic solution Place 1 drop into both eyes 2 (two) times daily.     Marland Kitchen entecavir (BARACLUDE) 1 MG tablet Take 1 mg by mouth daily.    Marland Kitchen KLOR-CON M20 20 MEQ tablet TAKE 1 TABLET (20 MEQ TOTAL) BY MOUTH DAILY. 60 tablet 0  . magnesium oxide (MAG-OX) 400 MG tablet Take 400 mg by mouth at bedtime.    . Multiple Vitamins-Minerals (CENTRUM SILVER PO) Take 1 tablet by mouth daily.     . Omega-3 Fatty Acids (OMEGA 3 PO) Take 1 capsule by mouth 2 (two) times daily.     . risedronate (ACTONEL) 150 MG tablet TAKE 1 TABLET EVERY 30 DAYS WITH WATER ON EMPTY STOMACH NOTHING BY MOUTH OR LIE DOWN FOR NEXT 30 MIN 1 tablet 8    No current facility-administered medications for this visit.    Family History  Problem Relation Age of Onset  . Osteoporosis Mother   . Other Other        Neg cardiac family Hx  . Neuromuscular disorder Neg Hx     Review of Systems  All other systems reviewed and are negative.   Exam:   BP 108/62   Pulse 83   LMP 11/10/2006 (Approximate)   SpO2 99%     General appearance: alert, cooperative and appears stated age Head: normocephalic, without obvious abnormality, atraumatic Neck: no adenopathy, supple, symmetrical, trachea midline and thyroid normal to inspection and palpation Lungs: clear to auscultation bilaterally Breasts: normal appearance, no masses or tenderness, No nipple retraction or dimpling, No nipple discharge or bleeding, No axillary adenopathy Heart: regular rate and rhythm Abdomen: soft, non-tender; no masses, no organomegaly Extremities: extremities normal, atraumatic, no cyanosis or edema Skin: skin color, texture, turgor normal. No rashes or lesions Lymph nodes: cervical, supraclavicular, and axillary nodes normal. Neurologic: grossly normal  Pelvic: External genitalia:  no lesions              No abnormal inguinal nodes palpated.              Urethra:  normal appearing urethra with no masses, tenderness or lesions              Bartholins and Skenes: normal                 Vagina: normal appearing vagina with normal color and discharge, no lesions              Cervix: no lesions              Pap taken: No. Bimanual Exam:  Uterus:  normal size, contour, position, consistency, mobility, non-tender              Adnexa: no mass, fullness, tenderness              Rectal exam: Yes.  .  Confirms.              Anus:  normal sphincter tone, no lesions  Chaperone was present for exam.  Assessment:   Well woman visit with normal exam. Postmenopausal bleeding. Osteoporosis. On Actonel.  Chronic hepatitis B with cirrhosis.  Atrial fibrillation. On  Eliquis.   Plan: Mammogram screening discussed. Self breast awareness reviewed. Pap not needed. Guidelines for Calcium, Vitamin D, regular exercise program including cardiovascular and weight bearing exercise. CMP, vit D.  Refill of Actonel for 3 months.  Needs BMD. Follow up annually and prn.

## 2020-10-15 ENCOUNTER — Ambulatory Visit: Payer: BC Managed Care – PPO | Admitting: Obstetrics and Gynecology

## 2020-10-15 ENCOUNTER — Encounter: Payer: Self-pay | Admitting: Obstetrics and Gynecology

## 2020-10-15 ENCOUNTER — Other Ambulatory Visit: Payer: Self-pay

## 2020-10-15 VITALS — BP 108/62 | HR 83

## 2020-10-15 DIAGNOSIS — M8580 Other specified disorders of bone density and structure, unspecified site: Secondary | ICD-10-CM | POA: Diagnosis not present

## 2020-10-15 DIAGNOSIS — Z01419 Encounter for gynecological examination (general) (routine) without abnormal findings: Secondary | ICD-10-CM

## 2020-10-15 MED ORDER — RISEDRONATE SODIUM 150 MG PO TABS
ORAL_TABLET | ORAL | 2 refills | Status: DC
Start: 2020-10-15 — End: 2021-03-13

## 2020-10-15 NOTE — Patient Instructions (Signed)

## 2020-10-16 LAB — COMPREHENSIVE METABOLIC PANEL
ALT: 21 IU/L (ref 0–32)
AST: 23 IU/L (ref 0–40)
Albumin/Globulin Ratio: 1.7 (ref 1.2–2.2)
Albumin: 4.3 g/dL (ref 3.7–4.7)
Alkaline Phosphatase: 46 IU/L (ref 44–121)
BUN/Creatinine Ratio: 27 (ref 12–28)
BUN: 14 mg/dL (ref 8–27)
Bilirubin Total: <0.2 mg/dL (ref 0.0–1.2)
CO2: 29 mmol/L (ref 20–29)
Calcium: 9.4 mg/dL (ref 8.7–10.3)
Chloride: 103 mmol/L (ref 96–106)
Creatinine, Ser: 0.51 mg/dL — ABNORMAL LOW (ref 0.57–1.00)
GFR calc Af Amer: 109 mL/min/{1.73_m2} (ref >59)
GFR calc non Af Amer: 94 mL/min/{1.73_m2} (ref >59)
Globulin, Total: 2.6 g/dL (ref 1.5–4.5)
Glucose: 142 mg/dL — ABNORMAL HIGH (ref 65–99)
Potassium: 4.9 mmol/L (ref 3.5–5.2)
Sodium: 143 mmol/L (ref 134–144)
Total Protein: 6.9 g/dL (ref 6.0–8.5)

## 2020-10-16 LAB — VITAMIN D 25 HYDROXY (VIT D DEFICIENCY, FRACTURES): Vit D, 25-Hydroxy: 71.7 ng/mL (ref 30.0–100.0)

## 2020-11-22 ENCOUNTER — Other Ambulatory Visit: Payer: Self-pay

## 2020-11-22 NOTE — Telephone Encounter (Signed)
Patient was prescribed this for a 3 mos supply on 10/15/20 and picked it up on 11/03/20.  Needs to have BD test result.

## 2021-03-03 ENCOUNTER — Other Ambulatory Visit: Payer: Self-pay

## 2021-03-03 ENCOUNTER — Ambulatory Visit (HOSPITAL_COMMUNITY): Payer: Self-pay

## 2021-03-03 ENCOUNTER — Emergency Department (HOSPITAL_COMMUNITY): Payer: Medicare Other

## 2021-03-03 ENCOUNTER — Ambulatory Visit (HOSPITAL_COMMUNITY)
Admission: EM | Admit: 2021-03-03 | Discharge: 2021-03-03 | Disposition: A | Discharge status: Home / Self Care | Payer: Self-pay

## 2021-03-03 ENCOUNTER — Inpatient Hospital Stay (HOSPITAL_COMMUNITY)
Admission: EM | Admit: 2021-03-03 | Discharge: 2021-03-13 | DRG: 199 | Disposition: A | Discharge status: Home / Self Care | Payer: Medicare Other | Attending: Internal Medicine | Admitting: Internal Medicine

## 2021-03-03 ENCOUNTER — Encounter (HOSPITAL_COMMUNITY): Payer: Self-pay | Admitting: Emergency Medicine

## 2021-03-03 DIAGNOSIS — R296 Repeated falls: Secondary | ICD-10-CM | POA: Diagnosis not present

## 2021-03-03 DIAGNOSIS — J942 Hemothorax: Secondary | ICD-10-CM

## 2021-03-03 DIAGNOSIS — Z7901 Long term (current) use of anticoagulants: Secondary | ICD-10-CM

## 2021-03-03 DIAGNOSIS — S272XXA Traumatic hemopneumothorax, initial encounter: Principal | ICD-10-CM | POA: Diagnosis present

## 2021-03-03 DIAGNOSIS — S271XXA Traumatic hemothorax, initial encounter: Secondary | ICD-10-CM | POA: Diagnosis not present

## 2021-03-03 DIAGNOSIS — K5900 Constipation, unspecified: Secondary | ICD-10-CM | POA: Diagnosis not present

## 2021-03-03 DIAGNOSIS — I6529 Occlusion and stenosis of unspecified carotid artery: Secondary | ICD-10-CM | POA: Diagnosis not present

## 2021-03-03 DIAGNOSIS — Z9889 Other specified postprocedural states: Secondary | ICD-10-CM

## 2021-03-03 DIAGNOSIS — R531 Weakness: Secondary | ICD-10-CM | POA: Diagnosis not present

## 2021-03-03 DIAGNOSIS — J918 Pleural effusion in other conditions classified elsewhere: Secondary | ICD-10-CM | POA: Diagnosis not present

## 2021-03-03 DIAGNOSIS — R64 Cachexia: Secondary | ICD-10-CM | POA: Diagnosis present

## 2021-03-03 DIAGNOSIS — S8012XA Contusion of left lower leg, initial encounter: Secondary | ICD-10-CM | POA: Diagnosis present

## 2021-03-03 DIAGNOSIS — J90 Pleural effusion, not elsewhere classified: Secondary | ICD-10-CM | POA: Diagnosis not present

## 2021-03-03 DIAGNOSIS — S60011A Contusion of right thumb without damage to nail, initial encounter: Secondary | ICD-10-CM | POA: Diagnosis not present

## 2021-03-03 DIAGNOSIS — M47812 Spondylosis without myelopathy or radiculopathy, cervical region: Secondary | ICD-10-CM | POA: Diagnosis not present

## 2021-03-03 DIAGNOSIS — K7460 Unspecified cirrhosis of liver: Secondary | ICD-10-CM | POA: Diagnosis not present

## 2021-03-03 DIAGNOSIS — Z741 Need for assistance with personal care: Secondary | ICD-10-CM | POA: Diagnosis not present

## 2021-03-03 DIAGNOSIS — M810 Age-related osteoporosis without current pathological fracture: Secondary | ICD-10-CM | POA: Diagnosis present

## 2021-03-03 DIAGNOSIS — R0902 Hypoxemia: Secondary | ICD-10-CM | POA: Diagnosis not present

## 2021-03-03 DIAGNOSIS — Z713 Dietary counseling and surveillance: Secondary | ICD-10-CM

## 2021-03-03 DIAGNOSIS — Z9181 History of falling: Secondary | ICD-10-CM

## 2021-03-03 DIAGNOSIS — S2242XA Multiple fractures of ribs, left side, initial encounter for closed fracture: Secondary | ICD-10-CM | POA: Diagnosis present

## 2021-03-03 DIAGNOSIS — G9389 Other specified disorders of brain: Secondary | ICD-10-CM | POA: Diagnosis not present

## 2021-03-03 DIAGNOSIS — M1990 Unspecified osteoarthritis, unspecified site: Secondary | ICD-10-CM | POA: Diagnosis present

## 2021-03-03 DIAGNOSIS — J9811 Atelectasis: Secondary | ICD-10-CM | POA: Diagnosis not present

## 2021-03-03 DIAGNOSIS — R279 Unspecified lack of coordination: Secondary | ICD-10-CM | POA: Diagnosis not present

## 2021-03-03 DIAGNOSIS — Y92039 Unspecified place in apartment as the place of occurrence of the external cause: Secondary | ICD-10-CM | POA: Diagnosis not present

## 2021-03-03 DIAGNOSIS — S2220XA Unspecified fracture of sternum, initial encounter for closed fracture: Secondary | ICD-10-CM | POA: Diagnosis not present

## 2021-03-03 DIAGNOSIS — S2242XG Multiple fractures of ribs, left side, subsequent encounter for fracture with delayed healing: Secondary | ICD-10-CM | POA: Diagnosis not present

## 2021-03-03 DIAGNOSIS — S2242XD Multiple fractures of ribs, left side, subsequent encounter for fracture with routine healing: Secondary | ICD-10-CM | POA: Diagnosis not present

## 2021-03-03 DIAGNOSIS — S20212A Contusion of left front wall of thorax, initial encounter: Secondary | ICD-10-CM | POA: Diagnosis present

## 2021-03-03 DIAGNOSIS — J0130 Acute sphenoidal sinusitis, unspecified: Secondary | ICD-10-CM | POA: Diagnosis not present

## 2021-03-03 DIAGNOSIS — Z20822 Contact with and (suspected) exposure to covid-19: Secondary | ICD-10-CM | POA: Diagnosis not present

## 2021-03-03 DIAGNOSIS — I4819 Other persistent atrial fibrillation: Secondary | ICD-10-CM | POA: Diagnosis not present

## 2021-03-03 DIAGNOSIS — E43 Unspecified severe protein-calorie malnutrition: Secondary | ICD-10-CM | POA: Diagnosis present

## 2021-03-03 DIAGNOSIS — R410 Disorientation, unspecified: Secondary | ICD-10-CM | POA: Diagnosis not present

## 2021-03-03 DIAGNOSIS — W19XXXA Unspecified fall, initial encounter: Secondary | ICD-10-CM

## 2021-03-03 DIAGNOSIS — G20 Parkinson's disease: Secondary | ICD-10-CM | POA: Diagnosis not present

## 2021-03-03 DIAGNOSIS — R0602 Shortness of breath: Secondary | ICD-10-CM | POA: Diagnosis not present

## 2021-03-03 DIAGNOSIS — S270XXA Traumatic pneumothorax, initial encounter: Secondary | ICD-10-CM

## 2021-03-03 DIAGNOSIS — Z681 Body mass index (BMI) 19 or less, adult: Secondary | ICD-10-CM

## 2021-03-03 DIAGNOSIS — S0003XA Contusion of scalp, initial encounter: Secondary | ICD-10-CM | POA: Diagnosis not present

## 2021-03-03 DIAGNOSIS — W1830XA Fall on same level, unspecified, initial encounter: Secondary | ICD-10-CM | POA: Diagnosis present

## 2021-03-03 DIAGNOSIS — Z743 Need for continuous supervision: Secondary | ICD-10-CM | POA: Diagnosis not present

## 2021-03-03 DIAGNOSIS — R509 Fever, unspecified: Secondary | ICD-10-CM | POA: Diagnosis not present

## 2021-03-03 DIAGNOSIS — B181 Chronic viral hepatitis B without delta-agent: Secondary | ICD-10-CM | POA: Diagnosis present

## 2021-03-03 DIAGNOSIS — K838 Other specified diseases of biliary tract: Secondary | ICD-10-CM | POA: Diagnosis not present

## 2021-03-03 DIAGNOSIS — Z8262 Family history of osteoporosis: Secondary | ICD-10-CM

## 2021-03-03 DIAGNOSIS — S60222A Contusion of left hand, initial encounter: Secondary | ICD-10-CM | POA: Diagnosis present

## 2021-03-03 DIAGNOSIS — K7689 Other specified diseases of liver: Secondary | ICD-10-CM | POA: Diagnosis not present

## 2021-03-03 DIAGNOSIS — S8011XA Contusion of right lower leg, initial encounter: Secondary | ICD-10-CM | POA: Diagnosis present

## 2021-03-03 DIAGNOSIS — S3991XA Unspecified injury of abdomen, initial encounter: Secondary | ICD-10-CM | POA: Diagnosis not present

## 2021-03-03 DIAGNOSIS — R269 Unspecified abnormalities of gait and mobility: Secondary | ICD-10-CM | POA: Diagnosis present

## 2021-03-03 DIAGNOSIS — M6281 Muscle weakness (generalized): Secondary | ICD-10-CM | POA: Diagnosis not present

## 2021-03-03 DIAGNOSIS — I4891 Unspecified atrial fibrillation: Secondary | ICD-10-CM | POA: Diagnosis not present

## 2021-03-03 DIAGNOSIS — Z79899 Other long term (current) drug therapy: Secondary | ICD-10-CM

## 2021-03-03 DIAGNOSIS — Y92009 Unspecified place in unspecified non-institutional (private) residence as the place of occurrence of the external cause: Secondary | ICD-10-CM

## 2021-03-03 DIAGNOSIS — S0990XA Unspecified injury of head, initial encounter: Secondary | ICD-10-CM | POA: Diagnosis not present

## 2021-03-03 DIAGNOSIS — J939 Pneumothorax, unspecified: Secondary | ICD-10-CM | POA: Diagnosis not present

## 2021-03-03 DIAGNOSIS — R404 Transient alteration of awareness: Secondary | ICD-10-CM | POA: Diagnosis not present

## 2021-03-03 DIAGNOSIS — R2681 Unsteadiness on feet: Secondary | ICD-10-CM | POA: Diagnosis not present

## 2021-03-03 DIAGNOSIS — N281 Cyst of kidney, acquired: Secondary | ICD-10-CM | POA: Diagnosis not present

## 2021-03-03 DIAGNOSIS — S2249XA Multiple fractures of ribs, unspecified side, initial encounter for closed fracture: Secondary | ICD-10-CM | POA: Diagnosis present

## 2021-03-03 DIAGNOSIS — E876 Hypokalemia: Secondary | ICD-10-CM | POA: Diagnosis not present

## 2021-03-03 DIAGNOSIS — R29898 Other symptoms and signs involving the musculoskeletal system: Secondary | ICD-10-CM

## 2021-03-03 DIAGNOSIS — R6889 Other general symptoms and signs: Secondary | ICD-10-CM | POA: Diagnosis not present

## 2021-03-03 DIAGNOSIS — J690 Pneumonitis due to inhalation of food and vomit: Secondary | ICD-10-CM | POA: Diagnosis not present

## 2021-03-03 LAB — TYPE AND SCREEN
ABO/RH(D): A POS
Antibody Screen: NEGATIVE

## 2021-03-03 LAB — URINALYSIS, ROUTINE W REFLEX MICROSCOPIC
Bilirubin Urine: NEGATIVE
Glucose, UA: NEGATIVE mg/dL
Hgb urine dipstick: NEGATIVE
Ketones, ur: NEGATIVE mg/dL
Leukocytes,Ua: NEGATIVE
Nitrite: NEGATIVE
Protein, ur: NEGATIVE mg/dL
Specific Gravity, Urine: 1.008 (ref 1.005–1.030)
pH: 9 — ABNORMAL HIGH (ref 5.0–8.0)

## 2021-03-03 LAB — CBC WITH DIFFERENTIAL/PLATELET
Abs Immature Granulocytes: 0.06 10*3/uL (ref 0.00–0.07)
Basophils Absolute: 0 10*3/uL (ref 0.0–0.1)
Basophils Relative: 1 %
Eosinophils Absolute: 0.1 10*3/uL (ref 0.0–0.5)
Eosinophils Relative: 2 %
HCT: 36.9 % (ref 36.0–46.0)
Hemoglobin: 12.6 g/dL (ref 12.0–15.0)
Immature Granulocytes: 1 %
Lymphocytes Relative: 8 %
Lymphs Abs: 0.5 10*3/uL — ABNORMAL LOW (ref 0.7–4.0)
MCH: 35.1 pg — ABNORMAL HIGH (ref 26.0–34.0)
MCHC: 34.1 g/dL (ref 30.0–36.0)
MCV: 102.8 fL — ABNORMAL HIGH (ref 80.0–100.0)
Monocytes Absolute: 0.7 10*3/uL (ref 0.1–1.0)
Monocytes Relative: 11 %
Neutro Abs: 4.9 10*3/uL (ref 1.7–7.7)
Neutrophils Relative %: 77 %
Platelets: 225 10*3/uL (ref 150–400)
RBC: 3.59 MIL/uL — ABNORMAL LOW (ref 3.87–5.11)
RDW: 12.3 % (ref 11.5–15.5)
WBC: 6.2 10*3/uL (ref 4.0–10.5)
nRBC: 0 % (ref 0.0–0.2)

## 2021-03-03 LAB — COMPREHENSIVE METABOLIC PANEL
ALT: 24 U/L (ref 0–44)
AST: 32 U/L (ref 15–41)
Albumin: 2.9 g/dL — ABNORMAL LOW (ref 3.5–5.0)
Alkaline Phosphatase: 42 U/L (ref 38–126)
Anion gap: 9 (ref 5–15)
BUN: 13 mg/dL (ref 8–23)
CO2: 23 mmol/L (ref 22–32)
Calcium: 8.6 mg/dL — ABNORMAL LOW (ref 8.9–10.3)
Chloride: 108 mmol/L (ref 98–111)
Creatinine, Ser: 0.44 mg/dL (ref 0.44–1.00)
GFR, Estimated: >60 mL/min (ref >60)
Glucose, Bld: 122 mg/dL — ABNORMAL HIGH (ref 70–99)
Potassium: 4 mmol/L (ref 3.5–5.1)
Sodium: 140 mmol/L (ref 135–145)
Total Bilirubin: 0.6 mg/dL (ref 0.3–1.2)
Total Protein: 5.6 g/dL — ABNORMAL LOW (ref 6.5–8.1)

## 2021-03-03 LAB — I-STAT CREATININE, ED: Creatinine, Ser: 0.4 mg/dL — ABNORMAL LOW (ref 0.44–1.00)

## 2021-03-03 LAB — LACTIC ACID, PLASMA: Lactic Acid, Venous: 1.1 mmol/L (ref 0.5–1.9)

## 2021-03-03 MED ORDER — IOHEXOL 300 MG/ML  SOLN
100.0000 mL | Freq: Once | INTRAMUSCULAR | Status: AC | PRN
  Administered 2021-03-03: 100 mL via INTRAVENOUS

## 2021-03-03 NOTE — ED Triage Notes (Signed)
Pt states she fell on Friday due to "muscle weakness" that is chronic.  States she has frequent falls.  Reports hitting her head.  Takes Eliquis.  Denies LOC.  Reports pain all over body.

## 2021-03-03 NOTE — ED Provider Notes (Signed)
[facility] Provider Note   CSN: [id number] Arrival date & time: 03/03/21  1557     History Chief Complaint  Patient presents with  . Fall    [facility] is a 75 y.o. female.  Patient with history of persistent atrial fibrillation on anticoagulation, history of gait abnormality/parkinsonism, chronic muscle weakness --presents to the emergency department today for evaluation of head injury after a fall occurring 2 nights ago.  Patient states that she has been weaker than baseline.  She states that she had a fall from standing and struck the top of her head causing a bump.  Yesterday she felt fatigued.  No severe headache, vomiting, confusion.  She states that she is eating and drinking well.  No urinary symptoms.  On arrival to the emergency department today her temperature is 100.3 F.  No treatments prior to arrival.  Onset of symptoms acute.  Nothing make symptoms better or worse.  She denies other significant injuries but did sustained some mild abrasions and bruising to her left hand and bilateral lower extremities.        Past Medical History:  Diagnosis Date  . 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 Chronic viral hepatitis B without delta-agent ([place])   . Cirrhosis of liver ([place])   . Fibroid 2004, 2016  . Hemorrhage anterior chamber eye    On pradaxa  . Hepatitis B infection    Chronic Hep B carrier, on suppressive Tx  . Osteoarthritis        . Osteoporosis   . Osteoporosis   . Persistent atrial fibrillation (HCC)    a. s/p DCCV b. intolerant of Flecainide  . Prediabetes     Patient Active Problem List   Diagnosis Date Noted  . Parkinsonism ([place]) 06/14/2018  . Persistent atrial fibrillation ([place]) 03/27/2015  . Alopecia 02/09/2013  . Palpitations 06/13/2011  . Chest pain 06/13/2011  . ATRIAL FIBRILLATION 12/31/2009  . FATIGUE 12/31/2009    Past Surgical History:  Procedure Laterality Date  . ABLATION  09/06/2015  .  CARDIAC ELECTROPHYSIOLOGY MAPPING AND ABLATION  06/2015  . CARDIOVERSION  04/02/2012   Procedure: CARDIOVERSION;  Surgeon: Deboraha Sprang, MD;  Location: [place];  Service: Cardiovascular;  Laterality: N/A;  . CARDIOVERSION N/A 09/12/2013   Procedure: CARDIOVERSION;  Surgeon: Deboraha Sprang, MD;  Location: [place];  Service: Cardiovascular;  Laterality: N/A;  . CARDIOVERSION N/A 01/26/2014   Procedure: CARDIOVERSION;  Surgeon: Deboraha Sprang, MD;  Location: [facility] ENDOSCOPY;  Service: Cardiovascular;  Laterality: N/A;  . CARDIOVERSION N/A 03/30/2015   Procedure: CARDIOVERSION;  Surgeon: Thompson Grayer, MD;  Location: [facility] ENDOSCOPY;  Service: Cardiovascular;  Laterality: N/A;  . CATARACT EXTRACTION W/ INTRAOCULAR LENS IMPLANT Left 03/2011  . CATARACT EXTRACTION W/ INTRAOCULAR LENS IMPLANT Right 2013  . RADIOFREQUENCY ABLATION       OB History    Gravida  2   Para  2   Term      Preterm      AB      Living  2     SAB      IAB      Ectopic      Multiple      Live Births              Family History  Problem Relation Age of Onset  . Osteoporosis Mother   . Other Other        Neg cardiac family Hx  .  Neuromuscular disorder Neg Hx     Social History   Tobacco Use  . Smoking status: Never Smoker  . Smokeless tobacco: Never Used  Vaping Use  . Vaping Use: Never used  Substance Use Topics  . Alcohol use: No    Alcohol/week: 0.0 standard drinks  . Drug use: No    Home Medications Prior to Admission medications   Medication Sig Start Date End Date Taking? Authorizing Provider  apixaban (ELIQUIS) 2.5 MG TABS tablet Take 2.5 mg by mouth 2 (two) times daily.    [provider]  b complex vitamins capsule Take 1 capsule by mouth daily.    [provider]  beta carotene w/minerals (OCUVITE) tablet Take 1 tablet by mouth daily.     [provider]  carbidopa-levodopa (SINEMET IR) 25-100 MG tablet Take 1 tablet by mouth 3 (three) times daily.  04/19/20   [provider]  cholecalciferol (VITAMIN D) 1000 UNITS tablet Take by mouth daily. 2000 units at breakfast and 1000 units at lunch    [provider]  diltiazem (CARDIZEM CD) 120 MG 24 hr capsule TAKE 1 CAPSULE (120 MG TOTAL) BY MOUTH DAILY. 02/12/16   Deboraha Sprang, MD  diltiazem (CARDIZEM) 30 MG tablet Take 30 mg by mouth as needed.  10/22/16   [provider]  dofetilide (TIKOSYN) 125 MCG capsule Take 1 capsule by mouth daily. 05/19/16   [provider]  dorzolamide (TRUSOPT) 2 % ophthalmic solution Place 1 drop into both eyes 2 (two) times daily.  05/09/15   [provider]  entecavir (BARACLUDE) 1 MG tablet Take 1 mg by mouth daily.    [provider]  KLOR-CON M20 20 MEQ tablet TAKE 1 TABLET (20 MEQ TOTAL) BY MOUTH DAILY. 02/12/16   Deboraha Sprang, MD  magnesium oxide (MAG-OX) 400 MG tablet Take 400 mg by mouth at bedtime. 06/18/15   [provider]  Multiple Vitamins-Minerals (CENTRUM SILVER PO) Take 1 tablet by mouth daily.     [provider]  Omega-3 Fatty Acids (OMEGA 3 PO) Take 1 capsule by mouth 2 (two) times daily.     [provider]  risedronate (ACTONEL) 150 MG tablet TAKE 1 TABLET EVERY 30 DAYS WITH WATER ON EMPTY STOMACH NOTHING BY MOUTH OR LIE DOWN FOR NEXT 30 MIN 10/15/20   Nunzio Cobbs, MD    Allergies    Patient has no known allergies.  Review of Systems   Review of Systems  Constitutional: Positive for fatigue. Negative for fever.  HENT: Negative for rhinorrhea and sore throat.   Eyes: Negative for redness.  Respiratory: Negative for cough.   Cardiovascular: Negative for chest pain.  Gastrointestinal: Negative for abdominal pain, diarrhea, nausea and vomiting.  Genitourinary: Negative for dysuria, frequency, hematuria and urgency.  Musculoskeletal: Negative for myalgias.  Skin: Negative for rash.  Neurological: Positive for weakness. Negative for headaches.     Physical Exam Updated Vital Signs BP 97/61 (BP Location: Right Arm)   Pulse 95   Temp 100.3 F (37.9 C) (Oral)   Resp 18   LMP 11/10/2006 (Approximate)   SpO2 96%   Physical Exam Vitals and nursing note reviewed.  Constitutional:      Appearance: She is well-developed.  HENT:     Head: Normocephalic and atraumatic. No raccoon eyes or Battle's sign.     Right Ear: Tympanic membrane, ear canal and external ear normal. No hemotympanum.     Left Ear: Tympanic membrane,  ear canal and external ear normal. No hemotympanum.     Nose: Nose normal.     Mouth/Throat:     Pharynx: Uvula midline.  Eyes:     General: Lids are normal.     Extraocular Movements:     Right eye: No nystagmus.     Left eye: No nystagmus.     Conjunctiva/sclera: Conjunctivae normal.     Pupils: Pupils are equal, round, and reactive to light.     Comments: No visible hyphema noted  Cardiovascular:     Rate and Rhythm: Normal rate and regular rhythm.     Comments: L lateral chest wall contusion and violaceous ecchymosis with associated tenderness.   Pulmonary:     Effort: Pulmonary effort is normal. No respiratory distress.     Breath sounds: Normal breath sounds.  Abdominal:     Palpations: Abdomen is soft.     Tenderness: There is no abdominal tenderness. There is no guarding or rebound.  Musculoskeletal:     Cervical back: Normal range of motion and neck supple. No tenderness or bony tenderness.     Thoracic back: No tenderness or bony tenderness.     Lumbar back: No tenderness or bony tenderness.     Comments: Full ROM upper and lower extremities including hips. Mild contusion bilateral shins. There is a small ecchymosis without tenderness base of right thumb. Full active ROM in all joints of hand.   Skin:    General: Skin is warm and dry.  Neurological:     Mental Status: She is alert and oriented to person, place, and time.     GCS: GCS eye subscore is 4. GCS verbal subscore is 5. GCS motor  subscore is 6.     Cranial Nerves: No cranial nerve deficit.     Sensory: No sensory deficit.     Coordination: Coordination normal.     Deep Tendon Reflexes: Reflexes are normal and symmetric.     ED Results / Procedures / Treatments   Labs (all labs ordered are listed, but only abnormal results are displayed) Labs Reviewed  CBC WITH DIFFERENTIAL/PLATELET - Abnormal; Notable for the following components:      Result Value   RBC 3.59 (*)    MCV 102.8 (*)    MCH 35.1 (*)    Lymphs Abs 0.5 (*)    All other components within normal limits  LACTIC ACID, PLASMA  URINALYSIS, ROUTINE W REFLEX MICROSCOPIC  COMPREHENSIVE METABOLIC PANEL    EKG EKG Interpretation  Date/Time:  Sunday March 03 2021 16:08:52 EDT Ventricular Rate:  105 PR Interval:    QRS Duration: 68 QT Interval:  284 QTC Calculation: 375 R Axis:   65 Text Interpretation: Atrial fibrillation with rapid ventricular response Abnormal ECG Confirmed by Thamas Jaegers ([id number]) on 03/03/2021 5:37:55 PM   Radiology DG Chest 2 View  Result Date: 03/03/2021 CLINICAL DATA:  Weakness.  Fall.  Fever. EXAM: CHEST - 2 VIEW COMPARISON:  Most recent chest radiograph 01/22/2010 FINDINGS: Patient is rotated. Left pleural effusion is at least moderate in size. Small left apical pneumothorax, visualized between the posterior second and third rib. Right lung is clear. Heart size grossly normal. Bones are diffusely under mineralized. Mildly displaced fracture of left lateral sixth rib. IMPRESSION: 1. Small left apical pneumothorax and at least moderate left pleural effusion. 2. Mildly displaced left lateral sixth rib fracture. These results were called by telephone at the time of interpretation on 03/03/2021 at 5:25 pm to provider [facility]  Petra Sargeant , who verbally acknowledged these results. Electronically Signed   By: Keith Rake M.D.   On: 03/03/2021 17:25   CT Head Wo Contrast  Result Date: 03/03/2021 CLINICAL DATA:  Fall with head trauma and  bleeding. EXAM: CT HEAD WITHOUT CONTRAST TECHNIQUE: Contiguous axial images were obtained from the base of the skull through the vertex without intravenous contrast. COMPARISON:  Report from a brain MR dated 07/06/2018. FINDINGS: Brain: No evidence of acute infarction, hemorrhage, hydrocephalus, extra-axial collection or mass lesion/mass effect. There is mild cerebral volume loss with associated ex vacuo dilatation. Periventricular white matter hypoattenuation likely represents chronic small vessel ischemic disease. Encephalomalacia is seen in the right frontal parasagittal region. Vascular: There are vascular calcifications in the carotid siphons. Skull: Normal. Negative for fracture or focal lesion. Sinuses/Orbits: There is chronic sinusitis of the left sphenoid sinus. Other: Scalp soft tissue swelling is seen overlying the right parietal bone. IMPRESSION: 1. No acute intracranial process. Electronically Signed   By: Zerita Boers M.D.   On: 03/03/2021 17:35    Procedures Procedures   Medications Ordered in ED Medications - No data to display  ED Course  I have reviewed the triage vital signs and the nursing notes.  Pertinent labs & imaging results that were available during my care of the patient were reviewed by me and considered in my medical decision making (see chart for details).  Patient seen and examined. She is in no distress.  She is mainly worried about her head injury.  Labs/CXR/UA ordered due to increasing weakess and borderline temp.    Vital signs reviewed and are as follows: BP 107/75   Pulse 97   Temp 100.3 F (37.9 C) (Oral)   Resp 16   LMP 11/10/2006 (Approximate)   SpO2 96%   5:48 PM called by radiology in regards to the patient's chest x-ray findings.  I went and obtained additional history from the patient and husband who is now at bedside.  Prior to Friday, patient has had multiple previous falls.  They help the patient and have to pick her up off the floor very  frequently.  Question as to whether chest injury happened on Friday when she hit her head or with a previous fall.  CT imaging of the chest, abdomen, pelvis ordered.  From a respiratory standpoint, patient is no distress.  She has normal oxygen saturations on room air.  Discussed patient with Dr. Almyra Free.   BP 107/75   Pulse 97   Temp 100.3 F (37.9 C) (Oral)   Resp 16   LMP 11/10/2006 (Approximate)   SpO2 96%   8:40 PM There has been a delay in imaging due to being unable to obtain chemistry. CMP now on third re-draw. I have asked for istat creatinine/chem 8 to be drawn but have not yet seen results for this.   9:06 PM Creatinine returned at 0.40.   10:27 PM CT imaging performed with findings as above.  I discussed these findings with Dr. Dema Severin of trauma surgery.  Recommends admission to medicine for observation, they will consult.  I went and had a detailed discussion with the patient's husband and the patient regarding these findings.  Discussed recommendations of inpatient admission due to high risk of worsening, including development of pneumonia, potential worsening hemothorax or pneumothorax requiring emergent intervention.  Patient's husband asks me several questions and does not feel that much benefit will be gained by staying in the hospital.  He states that he would like to  monitor patient carefully at home and is willing to return if she gets worse.  We had a prolonged discussion regarding leaving and he does not change his mind.  I did asked that he wait to discuss with trauma surgery and he agrees.   Updated Dr. Dema Severin on discussion. Discussed with Dr. Almyra Free.   12:05 AM Trauma has seen. Pt and family agree to admission.   Signout to [employer] who will call for medical admission.    MDM Rules/Calculators/A&P                          Admit.    Final Clinical Impression(s) / ED Diagnoses Final diagnoses:  Hemothorax on left  Traumatic pneumothorax, initial encounter   Closed fracture of multiple ribs of left side, initial encounter  Injury of head, initial encounter    Rx / DC Orders ED Discharge Orders    None       Carlisle Cater, Hershal Coria 03/04/21 0006    Luna Fuse, MD 03/04/21 [phone]

## 2021-03-03 NOTE — ED Notes (Signed)
Trauma MD at bedside.

## 2021-03-03 NOTE — ED Triage Notes (Signed)
Verna Czech, PA into traige to assess pt status  Pt advised to be evaluated in the ED for head scans due to fall 2 days ago and pt being on blood thinners

## 2021-03-03 NOTE — Consult Note (Addendum)
Trauma consult  HPI: Connie Chase is an 75 y.o. female with hx of multiple recent falls at home; afib (on PO anticoagulation), parkinson's, cirrhosis, chronic muscle weakness, presented to ED with fall from standing 48 hrs ago and believes she struck her head. She has undergone workup in ed and we are asked to see  Currently, does not interact much with providers - her husband has been very involved and speaking much for her. But she will speak when directly spoken to and answers questions appropriately.  Past Medical History:  Diagnosis Date  . 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 Chronic viral hepatitis B without delta-agent ([place])   . Cirrhosis of liver ([place])   . Fibroid 2004, 2016  . Hemorrhage anterior chamber eye    On pradaxa  . Hepatitis B infection    Chronic Hep B carrier, on suppressive Tx  . Osteoarthritis        . Osteoporosis   . Osteoporosis   . Persistent atrial fibrillation (H[facility])    a. s/p DCCV b. intolerant of Flecainide  . Prediabetes     Past Surgical History:  Procedure Laterality Date  . ABLATION  09/06/2015  . CARDIAC ELECTROPHYSIOLOGY MAPPING AND ABLATION  06/2015  . CARDIOVERSION  04/02/2012   Procedure: CARDIOVERSION;  Surgeon: Deboraha Sprang, MD;  Location: [facility];  Service: Cardiovascular;  Laterality: N/A;  . CARDIOVERSION N/A 09/12/2013   Procedure: CARDIOVERSION;  Surgeon: Deboraha Sprang, MD;  Location: [facility];  Service: Cardiovascular;  Laterality: N/A;  . CARDIOVERSION N/A 01/26/2014   Procedure: CARDIOVERSION;  Surgeon: Deboraha Sprang, MD;  Location: [facility] ENDOSCOPY;  Service: Cardiovascular;  Laterality: N/A;  . CARDIOVERSION N/A 03/30/2015   Procedure: CARDIOVERSION;  Surgeon: Thompson Grayer, MD;  Location: [facility] ENDOSCOPY;  Service: Cardiovascular;  Laterality: N/A;  . CATARACT EXTRACTION W/ INTRAOCULAR LENS IMPLANT Left 03/2011  . CATARACT EXTRACTION W/ INTRAOCULAR LENS IMPLANT Right 2013  . RADIOFREQUENCY ABLATION      Family History   Problem Relation Age of Onset  . Osteoporosis Mother   . Other Other        Neg cardiac family Hx  . Neuromuscular disorder Neg Hx     Social:  reports that she has never smoked. She has never used smokeless tobacco. She reports that she does not drink alcohol and does not use drugs.  Allergies: No Known Allergies  Medications: I have reviewed the patient's current medications.  Results for orders placed or performed during the hospital encounter of 03/03/21 (from the past 48 hour(s))  CBC with Differential/Platelet     Status: Abnormal   Collection Time: 03/03/21  4:29 PM  Result Value Ref Range   WBC 6.2 4.0 - 10.5 K/uL   RBC 3.59 (L) 3.87 - 5.11 MIL/uL   Hemoglobin 12.6 12.0 - 15.0 g/dL   HCT 36.9 36.0 - 46.0 %   MCV 102.8 (H) 80.0 - 100.0 fL   MCH 35.1 (H) 26.0 - 34.0 pg   MCHC 34.1 30.0 - 36.0 g/dL   RDW 12.3 11.5 - 15.5 %   Platelets 225 150 - 400 K/uL   nRBC 0.0 0.0 - 0.2 %   Neutrophils Relative % 77 %   Neutro Abs 4.9 1.7 - 7.7 K/uL   Lymphocytes Relative 8 %   Lymphs Abs 0.5 (L) 0.7 - 4.0 K/uL   Monocytes Relative 11 %   Monocytes Absolute 0.7 0.1 - 1.0 K/uL   Eosinophils Relative 2 %  Eosinophils Absolute 0.1 0.0 - 0.5 K/uL   Basophils Relative 1 %   Basophils Absolute 0.0 0.0 - 0.1 K/uL   Immature Granulocytes 1 %   Abs Immature Granulocytes 0.06 0.00 - 0.07 K/uL    Comment: Performed at [facility] [address], [place], [place] [address]  Lactic acid, plasma     Status: None   Collection Time: 03/03/21  4:47 PM  Result Value Ref Range   Lactic Acid, Venous 1.1 0.5 - 1.9 mmol/L    Comment: Performed at [facility] [address], [place], [place] [address]  Type and screen     Status: None   Collection Time: 03/03/21  6:05 PM  Result Value Ref Range   ABO/RH(D) A POS    Antibody Screen NEG    Sample Expiration      03/06/2021,2359 Performed at [facility], [place] [address] [place], [place] [address]   Comprehensive  metabolic panel     Status: Abnormal   Collection Time: 03/03/21  8:34 PM  Result Value Ref Range   Sodium 140 135 - 145 mmol/L   Potassium 4.0 3.5 - 5.1 mmol/L   Chloride 108 98 - 111 mmol/L   CO2 23 22 - 32 mmol/L   Glucose, Bld 122 (H) 70 - 99 mg/dL    Comment: Glucose reference range applies only to samples taken after fasting for at least 8 hours.   BUN 13 8 - 23 mg/dL   Creatinine, Ser 0.44 0.44 - 1.00 mg/dL   Calcium 8.6 (L) 8.9 - 10.3 mg/dL   Total Protein 5.6 (L) 6.5 - 8.1 g/dL   Albumin 2.9 (L) 3.5 - 5.0 g/dL   AST 32 15 - 41 U/L   ALT 24 0 - 44 U/L   Alkaline Phosphatase 42 38 - 126 U/L   Total Bilirubin 0.6 0.3 - 1.2 mg/dL   GFR, Estimated >60 >60 mL/min    Comment: (NOTE) Calculated using the CKD-EPI Creatinine Equation (2021)    Anion gap 9 5 - 15    Comment: Performed at [facility], [place] [address], [place], [place] [address]  I-Stat Creatinine, ED (not at [facility])     Status: Abnormal   Collection Time: 03/03/21  8:55 PM  Result Value Ref Range   Creatinine, Ser 0.40 (L) 0.44 - 1.00 mg/dL  Urinalysis, Routine w reflex microscopic     Status: Abnormal   Collection Time: 03/03/21  9:11 PM  Result Value Ref Range   Color, Urine YELLOW YELLOW   APPearance CLOUDY (A) CLEAR   Specific Gravity, Urine 1.008 1.005 - 1.030   pH 9.0 (H) 5.0 - 8.0   Glucose, UA NEGATIVE NEGATIVE mg/dL   Hgb urine dipstick NEGATIVE NEGATIVE   Bilirubin Urine NEGATIVE NEGATIVE   Ketones, ur NEGATIVE NEGATIVE mg/dL   Protein, ur NEGATIVE NEGATIVE mg/dL   Nitrite NEGATIVE NEGATIVE   Leukocytes,Ua NEGATIVE NEGATIVE    Comment: Performed at [facility] [address] [place], [place] [address]    DG Chest 2 View  Result Date: 03/03/2021 CLINICAL DATA:  Weakness.  Fall.  Fever. EXAM: CHEST - 2 VIEW COMPARISON:  Most recent chest radiograph 01/22/2010 FINDINGS: Patient is rotated. Left pleural effusion is at least moderate in size. Small left apical pneumothorax, visualized  between the posterior second and third rib. Right lung is clear. Heart size grossly normal. Bones are diffusely under mineralized. Mildly displaced fracture of left lateral  sixth rib. IMPRESSION: 1. Small left apical pneumothorax and at least moderate left pleural effusion. 2. Mildly displaced left lateral sixth rib fracture. These results were called by telephone at the time of interpretation on 03/03/2021 at 5:25 pm to provider [facility] , who verbally acknowledged these results. Electronically Signed   By: Narda RutherfordMelanie  Sanford M.D.   On: 03/03/2021 17:25   CT Head Wo Contrast  Result Date: 03/03/2021 CLINICAL DATA:  Fall with head trauma and bleeding. EXAM: CT HEAD WITHOUT CONTRAST TECHNIQUE: Contiguous axial images were obtained from the base of the skull through the vertex without intravenous contrast. COMPARISON:  Report from a brain MR dated 07/06/2018. FINDINGS: Brain: No evidence of acute infarction, hemorrhage, hydrocephalus, extra-axial collection or mass lesion/mass effect. There is mild cerebral volume loss with associated ex vacuo dilatation. Periventricular Ettel Albergo matter hypoattenuation likely represents chronic small vessel ischemic disease. Encephalomalacia is seen in the right frontal parasagittal region. Vascular: There are vascular calcifications in the carotid siphons. Skull: Normal. Negative for fracture or focal lesion. Sinuses/Orbits: There is chronic sinusitis of the left sphenoid sinus. Other: Scalp soft tissue swelling is seen overlying the right parietal bone. IMPRESSION: 1. No acute intracranial process. Electronically Signed   By: Romona Curlsyler  Litton M.D.   On: 03/03/2021 17:35   CT CHEST ABDOMEN PELVIS W CONTRAST  Result Date: 03/03/2021 CLINICAL DATA:  Rib fracture with traumatic hemopneumothorax. Fall 2 days ago with muscle weakness. EXAM: CT CHEST, ABDOMEN, AND PELVIS WITH CONTRAST TECHNIQUE: Multidetector CT imaging of the chest, abdomen and pelvis was performed following the  standard protocol during bolus administration of intravenous contrast. CONTRAST:  100mL OMNIPAQUE IOHEXOL 300 MG/ML  SOLN COMPARISON:  Chest radiograph earlier today. FINDINGS: CT CHEST FINDINGS Cardiovascular: No acute aortic or vascular injury. Mild aortic atherosclerosis. Upper normal heart size. There are coronary artery calcifications. No pericardial effusion. Mediastinum/Nodes: No mediastinal hemorrhage or hematoma. No pneumomediastinum. Heterogeneous thyroid gland without dominant nodule by CT. Patulous esophagus. Lungs/Pleura: Hemo pneumothorax, with moderate fluid component and small pneumothorax component. Pneumothorax component is less than 10%, greatest at the apex. No active extravasation within hemothorax. There is associated compressive atelectasis in the left lower lobe. Lingular consolidation is also likely atelectasis. No right pneumothorax. Trachea and central bronchi are patent. Musculoskeletal: Left lateral rib fractures 5 through 10, many of which are mildly displaced and comminuted. Tenth rib fracture may be subacute. The seventh rib fracture is segmental. No right rib fracture. Buckling of the lower sternal body with sclerosis suggestive of subacute or chronic sternal body fracture. No acute fracture of the thoracic spine. No acute fracture of the included clavicles or shoulder girdles. There is left chest wall soft tissue edema as well as intramuscular edema of the intercostal cell. No subcutaneous gas. CT ABDOMEN PELVIS FINDINGS Hepatobiliary: No hepatic injury or perihepatic hematoma. Tiny cyst in the right hepatic dome. Mild hepatic steatosis. Prominent common bile duct measuring 8 mm. No visualized choledocholithiasis. Gallbladder is unremarkable. Pancreas: No evidence of injury. No ductal dilatation or inflammation. Spleen: No splenic injury or perisplenic hematoma. Tiny low-density splenic lesions are too small to characterize, likely cysts. Adrenals/Urinary Tract: No adrenal  hemorrhage or renal injury identified. Punctate right adrenal calcification likely sequela of prior hemorrhage. Small left renal cyst. Bladder is unremarkable. Stomach/Bowel: No obvious bowel injury, wall thickening or inflammation. Decompressed stomach. Large volume of stool throughout the colon. There is fecalization of distal small bowel contents. Normal appendix. Vascular/Lymphatic: No acute vascular injury. Aortic atherosclerosis and tortuosity. Retroperitoneal fluid. No  bulky adenopathy. Reproductive: Retroverted uterus which is mildly bulky and contains scattered calcifications, suggestive of fibroids. No visualized adnexal mass. Other: No intra-abdominal free air or free fluid. Musculoskeletal: No fracture of the lumbar spine or pelvis. Scoliosis and degenerative change throughout the lumbar spine. IMPRESSION: 1. Left lateral rib fractures 5 through 10, many of which are mildly displaced and comminuted. The seventh rib fracture is segmental. Tenth rib fracture may be subacute. 2. Hemo pneumothorax with moderate fluid component and small pneumothorax component. Pneumothorax component is less than 10%, greatest at the apex. No active extravasation. 3. Buckling of the lower sternal body with sclerosis suggestive of subacute or chronic sternal body fracture. 4. No evidence of acute traumatic injury to the abdomen or pelvis. 5. Large volume of stool throughout the colon with fecalization of distal small bowel contents, suggesting constipation. 6. Prominent common bile duct measuring 8 mm, no visualized choledocholithiasis. Recommend correlation with LFTs. If LFTs are elevated or there is clinical concern for biliary pathology, consider further evaluation with MRCP, on an elective basis after resolution of acute event. Aortic Atherosclerosis (ICD10-I70.0). Electronically Signed   By: Keith Rake M.D.   On: 03/03/2021 21:41    ROS - Unable to obtain - did not wish to answer additional questions  PE Blood  pressure 99/67, pulse (!) 102, temperature 98.1 F (36.7 C), temperature source Oral, resp. rate 15, last menstrual period 11/10/2006, SpO2 97 %. Physical Exam Constitutional: NAD; conversant; no deformities Eyes: Moist conjunctiva; no lid lag; anicteric Neck: Trachea midline; no thyromegaly Lungs: Normal respiratory effort; no tactile fremitus CV: RRR; no palpable thrills; no pitting edema GI: Abd soft, NT/ND; no palpable hepatosplenomegaly MSK: Normal range of motion of extremities; no clubbing/cyanosis; no deformities Psychiatric: Appropriate affect; alert and oriented x3 Lymphatic: No palpable cervical or axillary lymphadenopathy  Results for orders placed or performed during the hospital encounter of 03/03/21 (from the past 48 hour(s))  CBC with Differential/Platelet     Status: Abnormal   Collection Time: 03/03/21  4:29 PM  Result Value Ref Range   WBC 6.2 4.0 - 10.5 K/uL   RBC 3.59 (L) 3.87 - 5.11 MIL/uL   Hemoglobin 12.6 12.0 - 15.0 g/dL   HCT 36.9 36.0 - 46.0 %   MCV 102.8 (H) 80.0 - 100.0 fL   MCH 35.1 (H) 26.0 - 34.0 pg   MCHC 34.1 30.0 - 36.0 g/dL   RDW 12.3 11.5 - 15.5 %   Platelets 225 150 - 400 K/uL   nRBC 0.0 0.0 - 0.2 %   Neutrophils Relative % 77 %   Neutro Abs 4.9 1.7 - 7.7 K/uL   Lymphocytes Relative 8 %   Lymphs Abs 0.5 (L) 0.7 - 4.0 K/uL   Monocytes Relative 11 %   Monocytes Absolute 0.7 0.1 - 1.0 K/uL   Eosinophils Relative 2 %   Eosinophils Absolute 0.1 0.0 - 0.5 K/uL   Basophils Relative 1 %   Basophils Absolute 0.0 0.0 - 0.1 K/uL   Immature Granulocytes 1 %   Abs Immature Granulocytes 0.06 0.00 - 0.07 K/uL    Comment: Performed at [facility], [address] [place], [place] [address]  Lactic acid, plasma     Status: None   Collection Time: 03/03/21  4:47 PM  Result Value Ref Range   Lactic Acid, Venous 1.1 0.5 - 1.9 mmol/L    Comment: Performed at [facility] [address], [place], [place] [address]  Type and screen  Status:  None   Collection Time: 03/03/21  6:05 PM  Result Value Ref Range   ABO/RH(D) A POS    Antibody Screen NEG    Sample Expiration      03/06/2021,2359 Performed at [facility], [place] [address] [place], [place] [address]   Comprehensive metabolic panel     Status: Abnormal   Collection Time: 03/03/21  8:34 PM  Result Value Ref Range   Sodium 140 135 - 145 mmol/L   Potassium 4.0 3.5 - 5.1 mmol/L   Chloride 108 98 - 111 mmol/L   CO2 23 22 - 32 mmol/L   Glucose, Bld 122 (H) 70 - 99 mg/dL    Comment: Glucose reference range applies only to samples taken after fasting for at least 8 hours.   BUN 13 8 - 23 mg/dL   Creatinine, Ser 0.44 0.44 - 1.00 mg/dL   Calcium 8.6 (L) 8.9 - 10.3 mg/dL   Total Protein 5.6 (L) 6.5 - 8.1 g/dL   Albumin 2.9 (L) 3.5 - 5.0 g/dL   AST 32 15 - 41 U/L   ALT 24 0 - 44 U/L   Alkaline Phosphatase 42 38 - 126 U/L   Total Bilirubin 0.6 0.3 - 1.2 mg/dL   GFR, Estimated >60 >60 mL/min    Comment: (NOTE) Calculated using the CKD-EPI Creatinine Equation (2021)    Anion gap 9 5 - 15    Comment: Performed at [facility], [place] [address] [place], [place] [address]  I-Stat Creatinine, ED (not at [facility])     Status: Abnormal   Collection Time: 03/03/21  8:55 PM  Result Value Ref Range   Creatinine, Ser 0.40 (L) 0.44 - 1.00 mg/dL  Urinalysis, Routine w reflex microscopic     Status: Abnormal   Collection Time: 03/03/21  9:11 PM  Result Value Ref Range   Color, Urine YELLOW YELLOW   APPearance CLOUDY (A) CLEAR   Specific Gravity, Urine 1.008 1.005 - 1.030   pH 9.0 (H) 5.0 - 8.0   Glucose, UA NEGATIVE NEGATIVE mg/dL   Hgb urine dipstick NEGATIVE NEGATIVE   Bilirubin Urine NEGATIVE NEGATIVE   Ketones, ur NEGATIVE NEGATIVE mg/dL   Protein, ur NEGATIVE NEGATIVE mg/dL   Nitrite NEGATIVE NEGATIVE   Leukocytes,Ua NEGATIVE NEGATIVE    Comment: Performed at [facility] [address] [place], [place] [address]    DG Chest 2 View  Result Date:  03/03/2021 CLINICAL DATA:  Weakness.  Fall.  Fever. EXAM: CHEST - 2 VIEW COMPARISON:  Most recent chest radiograph 01/22/2010 FINDINGS: Patient is rotated. Left pleural effusion is at least moderate in size. Small left apical pneumothorax, visualized between the posterior second and third rib. Right lung is clear. Heart size grossly normal. Bones are diffusely under mineralized. Mildly displaced fracture of left lateral sixth rib. IMPRESSION: 1. Small left apical pneumothorax and at least moderate left pleural effusion. 2. Mildly displaced left lateral sixth rib fracture. These results were called by telephone at the time of interpretation on 03/03/2021 at 5:25 pm to provider [facility] , who verbally acknowledged these results. Electronically Signed   By: Keith Rake M.D.   On: 03/03/2021 17:25   CT Head Wo Contrast  Result Date: 03/03/2021 CLINICAL DATA:  Fall with head trauma and bleeding. EXAM: CT HEAD WITHOUT CONTRAST TECHNIQUE: Contiguous axial images were obtained from the base of the skull through the vertex without intravenous contrast. COMPARISON:  Report from a brain MR dated  07/06/2018. FINDINGS: Brain: No evidence of acute infarction, hemorrhage, hydrocephalus, extra-axial collection or mass lesion/mass effect. There is mild cerebral volume loss with associated ex vacuo dilatation. Periventricular Derren Suydam matter hypoattenuation likely represents chronic small vessel ischemic disease. Encephalomalacia is seen in the right frontal parasagittal region. Vascular: There are vascular calcifications in the carotid siphons. Skull: Normal. Negative for fracture or focal lesion. Sinuses/Orbits: There is chronic sinusitis of the left sphenoid sinus. Other: Scalp soft tissue swelling is seen overlying the right parietal bone. IMPRESSION: 1. No acute intracranial process. Electronically Signed   By: Zerita Boers M.D.   On: 03/03/2021 17:35   CT CHEST ABDOMEN PELVIS W CONTRAST  Result Date:  03/03/2021 CLINICAL DATA:  Rib fracture with traumatic hemopneumothorax. Fall 2 days ago with muscle weakness. EXAM: CT CHEST, ABDOMEN, AND PELVIS WITH CONTRAST TECHNIQUE: Multidetector CT imaging of the chest, abdomen and pelvis was performed following the standard protocol during bolus administration of intravenous contrast. CONTRAST:  166mL OMNIPAQUE IOHEXOL 300 MG/ML  SOLN COMPARISON:  Chest radiograph earlier today. FINDINGS: CT CHEST FINDINGS Cardiovascular: No acute aortic or vascular injury. Mild aortic atherosclerosis. Upper normal heart size. There are coronary artery calcifications. No pericardial effusion. Mediastinum/Nodes: No mediastinal hemorrhage or hematoma. No pneumomediastinum. Heterogeneous thyroid gland without dominant nodule by CT. Patulous esophagus. Lungs/Pleura: Hemo pneumothorax, with moderate fluid component and small pneumothorax component. Pneumothorax component is less than 10%, greatest at the apex. No active extravasation within hemothorax. There is associated compressive atelectasis in the left lower lobe. Lingular consolidation is also likely atelectasis. No right pneumothorax. Trachea and central bronchi are patent. Musculoskeletal: Left lateral rib fractures 5 through 10, many of which are mildly displaced and comminuted. Tenth rib fracture may be subacute. The seventh rib fracture is segmental. No right rib fracture. Buckling of the lower sternal body with sclerosis suggestive of subacute or chronic sternal body fracture. No acute fracture of the thoracic spine. No acute fracture of the included clavicles or shoulder girdles. There is left chest wall soft tissue edema as well as intramuscular edema of the intercostal cell. No subcutaneous gas. CT ABDOMEN PELVIS FINDINGS Hepatobiliary: No hepatic injury or perihepatic hematoma. Tiny cyst in the right hepatic dome. Mild hepatic steatosis. Prominent common bile duct measuring 8 mm. No visualized choledocholithiasis. Gallbladder is  unremarkable. Pancreas: No evidence of injury. No ductal dilatation or inflammation. Spleen: No splenic injury or perisplenic hematoma. Tiny low-density splenic lesions are too small to characterize, likely cysts. Adrenals/Urinary Tract: No adrenal hemorrhage or renal injury identified. Punctate right adrenal calcification likely sequela of prior hemorrhage. Small left renal cyst. Bladder is unremarkable. Stomach/Bowel: No obvious bowel injury, wall thickening or inflammation. Decompressed stomach. Large volume of stool throughout the colon. There is fecalization of distal small bowel contents. Normal appendix. Vascular/Lymphatic: No acute vascular injury. Aortic atherosclerosis and tortuosity. Retroperitoneal fluid. No bulky adenopathy. Reproductive: Retroverted uterus which is mildly bulky and contains scattered calcifications, suggestive of fibroids. No visualized adnexal mass. Other: No intra-abdominal free air or free fluid. Musculoskeletal: No fracture of the lumbar spine or pelvis. Scoliosis and degenerative change throughout the lumbar spine. IMPRESSION: 1. Left lateral rib fractures 5 through 10, many of which are mildly displaced and comminuted. The seventh rib fracture is segmental. Tenth rib fracture may be subacute. 2. Hemo pneumothorax with moderate fluid component and small pneumothorax component. Pneumothorax component is less than 10%, greatest at the apex. No active extravasation. 3. Buckling of the lower sternal body with sclerosis suggestive of subacute or chronic sternal  body fracture. 4. No evidence of acute traumatic injury to the abdomen or pelvis. 5. Large volume of stool throughout the colon with fecalization of distal small bowel contents, suggesting constipation. 6. Prominent common bile duct measuring 8 mm, no visualized choledocholithiasis. Recommend correlation with LFTs. If LFTs are elevated or there is clinical concern for biliary pathology, consider further evaluation with MRCP, on  an elective basis after resolution of acute event. Aortic Atherosclerosis (ICD10-I70.0). Electronically Signed   By: Keith Rake M.D.   On: 03/03/2021 21:41   Assessment/Plan: 74yoF s/p fall  -Would recommend admission to medicine, we will follow closely with you -Hold Eliquis while we monitor -L rib fxs 5-10 with associated occult apical ptx + small/mod hemothorax on L- multimodal pain control, repeat cxr in am, monitor O2 sats, IS 10x/hr while awake -Multiple recent falls - PT/OT; ?neurology eval for this history to ensure meds optimized. Would also address whether she is candidate for long-term anticoagulation for afib with this history  Nadeen Landau, MD [facility] Use [email or web address] to contact on call provider

## 2021-03-03 NOTE — ED Provider Notes (Incomplete)
[facility] EMERGENCY DEPARTMENT Provider Note   CSN: [id number] Arrival date & time: 03/03/21  1557     History Chief Complaint  Patient presents with  . Fall    Connie Chase is a 75 y.o. female.  Patient with history of persistent atrial fibrillation on anticoagulation, history of gait abnormality/parkinsonism, chronic muscle weakness --presents to the emergency department today for evaluation of head injury after a fall occurring 2 nights ago.  Patient states that she has been weaker than baseline.  She states that she had a fall from standing and struck the top of her head causing a bump.  Yesterday she felt fatigued.  No severe headache, vomiting, confusion.  She states that she is eating and drinking well.  No urinary symptoms.  On arrival to the emergency department today her temperature is 100.3 F.  No treatments prior to arrival.  Onset of symptoms acute.  Nothing make symptoms better or worse.  She denies other significant injuries but did sustained some mild abrasions and bruising to her left hand and bilateral lower extremities.        Past Medical History:  Diagnosis Date  . 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 Chronic viral hepatitis B without delta-agent ([place])   . Cirrhosis of liver ([place])   . Fibroid 2004, 2016  . Hemorrhage anterior chamber eye    On pradaxa  . Hepatitis B infection    Chronic Hep B carrier, on suppressive Tx  . Osteoarthritis        . Osteoporosis   . Osteoporosis   . Persistent atrial fibrillation ([facility])    a. s/p DCCV b. intolerant of Flecainide  . Prediabetes     Patient Active Problem List   Diagnosis Date Noted  . Parkinsonism ([place]) 06/14/2018  . Persistent atrial fibrillation ([place]) 03/27/2015  . Alopecia 02/09/2013  . Palpitations 06/13/2011  . Chest pain 06/13/2011  . ATRIAL FIBRILLATION 12/31/2009  . FATIGUE 12/31/2009    Past Surgical History:  Procedure Laterality Date  . ABLATION  09/06/2015  .  CARDIAC ELECTROPHYSIOLOGY MAPPING AND ABLATION  06/2015  . CARDIOVERSION  04/02/2012   Procedure: CARDIOVERSION;  Surgeon: Deboraha Sprang, MD;  Location: [facility];  Service: Cardiovascular;  Laterality: N/A;  . CARDIOVERSION N/A 09/12/2013   Procedure: CARDIOVERSION;  Surgeon: Deboraha Sprang, MD;  Location: [facility];  Service: Cardiovascular;  Laterality: N/A;  . CARDIOVERSION N/A 01/26/2014   Procedure: CARDIOVERSION;  Surgeon: Deboraha Sprang, MD;  Location: [facility] ENDOSCOPY;  Service: Cardiovascular;  Laterality: N/A;  . CARDIOVERSION N/A 03/30/2015   Procedure: CARDIOVERSION;  Surgeon: Thompson Grayer, MD;  Location: [facility] ENDOSCOPY;  Service: Cardiovascular;  Laterality: N/A;  . CATARACT EXTRACTION W/ INTRAOCULAR LENS IMPLANT Left 03/2011  . CATARACT EXTRACTION W/ INTRAOCULAR LENS IMPLANT Right 2013  . RADIOFREQUENCY ABLATION       OB History    Gravida  2   Para  2   Term      Preterm      AB      Living  2     SAB      IAB      Ectopic      Multiple      Live Births              Family History  Problem Relation Age of Onset  . Osteoporosis Mother   . Other Other        Neg cardiac family Hx  .  Neuromuscular disorder Neg Hx     Social History   Tobacco Use  . Smoking status: Never Smoker  . Smokeless tobacco: Never Used  Vaping Use  . Vaping Use: Never used  Substance Use Topics  . Alcohol use: No    Alcohol/week: 0.0 standard drinks  . Drug use: No    Home Medications Prior to Admission medications   Medication Sig Start Date End Date Taking? Authorizing Provider  apixaban (ELIQUIS) 2.5 MG TABS tablet Take 2.5 mg by mouth 2 (two) times daily.    [provider]  b complex vitamins capsule Take 1 capsule by mouth daily.    [provider]  beta carotene w/minerals (OCUVITE) tablet Take 1 tablet by mouth daily.     [provider]  carbidopa-levodopa (SINEMET IR) 25-100 MG tablet Take 1 tablet by mouth 3 (three) times daily.  04/19/20   [provider]  cholecalciferol (VITAMIN D) 1000 UNITS tablet Take by mouth daily. 2000 units at breakfast and 1000 units at lunch    [provider]  diltiazem (CARDIZEM CD) 120 MG 24 hr capsule TAKE 1 CAPSULE (120 MG TOTAL) BY MOUTH DAILY. 02/12/16   Deboraha Sprang, MD  diltiazem (CARDIZEM) 30 MG tablet Take 30 mg by mouth as needed.  10/22/16   [provider]  dofetilide (TIKOSYN) 125 MCG capsule Take 1 capsule by mouth daily. 05/19/16   [provider]  dorzolamide (TRUSOPT) 2 % ophthalmic solution Place 1 drop into both eyes 2 (two) times daily.  05/09/15   [provider]  entecavir (BARACLUDE) 1 MG tablet Take 1 mg by mouth daily.    [provider]  KLOR-CON M20 20 MEQ tablet TAKE 1 TABLET (20 MEQ TOTAL) BY MOUTH DAILY. 02/12/16   Deboraha Sprang, MD  magnesium oxide (MAG-OX) 400 MG tablet Take 400 mg by mouth at bedtime. 06/18/15   [provider]  Multiple Vitamins-Minerals (CENTRUM SILVER PO) Take 1 tablet by mouth daily.     [provider]  Omega-3 Fatty Acids (OMEGA 3 PO) Take 1 capsule by mouth 2 (two) times daily.     [provider]  risedronate (ACTONEL) 150 MG tablet TAKE 1 TABLET EVERY 30 DAYS WITH WATER ON EMPTY STOMACH NOTHING BY MOUTH OR LIE DOWN FOR NEXT 30 MIN 10/15/20   Nunzio Cobbs, MD    Allergies    Patient has no known allergies.  Review of Systems   Review of Systems  Constitutional: Positive for fatigue. Negative for fever.  HENT: Negative for rhinorrhea and sore throat.   Eyes: Negative for redness.  Respiratory: Negative for cough.   Cardiovascular: Negative for chest pain.  Gastrointestinal: Negative for abdominal pain, diarrhea, nausea and vomiting.  Genitourinary: Negative for dysuria, frequency, hematuria and urgency.  Musculoskeletal: Negative for myalgias.  Skin: Negative for rash.  Neurological: Positive for weakness. Negative for headaches.     Physical Exam Updated Vital Signs BP 97/61 (BP Location: Right Arm)   Pulse 95   Temp 100.3 F (37.9 C) (Oral)   Resp 18   LMP 11/10/2006 (Approximate)   SpO2 96%   Physical Exam Vitals and nursing note reviewed.  Constitutional:      Appearance: She is well-developed.  HENT:     Head: Normocephalic and atraumatic. No raccoon eyes or Battle's sign.     Right Ear: Tympanic membrane, ear canal and external ear normal. No hemotympanum.     Left Ear: Tympanic membrane,  ear canal and external ear normal. No hemotympanum.     Nose: Nose normal.     Mouth/Throat:     Pharynx: Uvula midline.  Eyes:     General: Lids are normal.     Extraocular Movements:     Right eye: No nystagmus.     Left eye: No nystagmus.     Conjunctiva/sclera: Conjunctivae normal.     Pupils: Pupils are equal, round, and reactive to light.     Comments: No visible hyphema noted  Cardiovascular:     Rate and Rhythm: Normal rate and regular rhythm.     Comments: L lateral chest wall contusion and violaceous ecchymosis with associated tenderness.   Pulmonary:     Effort: Pulmonary effort is normal. No respiratory distress.     Breath sounds: Normal breath sounds.  Abdominal:     Palpations: Abdomen is soft.     Tenderness: There is no abdominal tenderness. There is no guarding or rebound.  Musculoskeletal:     Cervical back: Normal range of motion and neck supple. No tenderness or bony tenderness.     Thoracic back: No tenderness or bony tenderness.     Lumbar back: No tenderness or bony tenderness.     Comments: Full ROM upper and lower extremities including hips. Mild contusion bilateral shins. There is a small ecchymosis without tenderness base of right thumb. Full active ROM in all joints of hand.   Skin:    General: Skin is warm and dry.  Neurological:     Mental Status: She is alert and oriented to person, place, and time.     GCS: GCS eye subscore is 4. GCS verbal subscore is 5. GCS motor  subscore is 6.     Cranial Nerves: No cranial nerve deficit.     Sensory: No sensory deficit.     Coordination: Coordination normal.     Deep Tendon Reflexes: Reflexes are normal and symmetric.     ED Results / Procedures / Treatments   Labs (all labs ordered are listed, but only abnormal results are displayed) Labs Reviewed  CBC WITH DIFFERENTIAL/PLATELET - Abnormal; Notable for the following components:      Result Value   RBC 3.59 (*)    MCV 102.8 (*)    MCH 35.1 (*)    Lymphs Abs 0.5 (*)    All other components within normal limits  LACTIC ACID, PLASMA  URINALYSIS, ROUTINE W REFLEX MICROSCOPIC  COMPREHENSIVE METABOLIC PANEL    EKG EKG Interpretation  Date/Time:  Sunday March 03 2021 16:08:52 EDT Ventricular Rate:  105 PR Interval:    QRS Duration: 68 QT Interval:  284 QTC Calculation: 375 R Axis:   65 Text Interpretation: Atrial fibrillation with rapid ventricular response Abnormal ECG Confirmed by Thamas Jaegers ([id number]) on 03/03/2021 5:37:55 PM   Radiology DG Chest 2 View  Result Date: 03/03/2021 CLINICAL DATA:  Weakness.  Fall.  Fever. EXAM: CHEST - 2 VIEW COMPARISON:  Most recent chest radiograph 01/22/2010 FINDINGS: Patient is rotated. Left pleural effusion is at least moderate in size. Small left apical pneumothorax, visualized between the posterior second and third rib. Right lung is clear. Heart size grossly normal. Bones are diffusely under mineralized. Mildly displaced fracture of left lateral sixth rib. IMPRESSION: 1. Small left apical pneumothorax and at least moderate left pleural effusion. 2. Mildly displaced left lateral sixth rib fracture. These results were called by telephone at the time of interpretation on 03/03/2021 at 5:25 pm to provider [facility]  Ghassan Coggeshall , who verbally acknowledged these results. Electronically Signed   By: Keith Rake M.D.   On: 03/03/2021 17:25   CT Head Wo Contrast  Result Date: 03/03/2021 CLINICAL DATA:  Fall with head trauma and  bleeding. EXAM: CT HEAD WITHOUT CONTRAST TECHNIQUE: Contiguous axial images were obtained from the base of the skull through the vertex without intravenous contrast. COMPARISON:  Report from a brain MR dated 07/06/2018. FINDINGS: Brain: No evidence of acute infarction, hemorrhage, hydrocephalus, extra-axial collection or mass lesion/mass effect. There is mild cerebral volume loss with associated ex vacuo dilatation. Periventricular white matter hypoattenuation likely represents chronic small vessel ischemic disease. Encephalomalacia is seen in the right frontal parasagittal region. Vascular: There are vascular calcifications in the carotid siphons. Skull: Normal. Negative for fracture or focal lesion. Sinuses/Orbits: There is chronic sinusitis of the left sphenoid sinus. Other: Scalp soft tissue swelling is seen overlying the right parietal bone. IMPRESSION: 1. No acute intracranial process. Electronically Signed   By: Zerita Boers M.D.   On: 03/03/2021 17:35    Procedures Procedures {Remember to document critical care time when appropriate:1}  Medications Ordered in ED Medications - No data to display  ED Course  I have reviewed the triage vital signs and the nursing notes.  Pertinent labs & imaging results that were available during my care of the patient were reviewed by me and considered in my medical decision making (see chart for details).  Patient seen and examined. She is in no distress.  She is mainly worried about her head injury.  Labs/CXR/UA ordered due to increasing weakess and borderline temp.    Vital signs reviewed and are as follows: BP 107/75   Pulse 97   Temp 100.3 F (37.9 C) (Oral)   Resp 16   LMP 11/10/2006 (Approximate)   SpO2 96%   5:48 PM called by radiology in regards to the patient's chest x-ray findings.  I went and obtained additional history from the patient and husband who is now at bedside.  Prior to Friday, patient has had multiple previous falls.  They help  the patient and have to pick her up off the floor very frequently.  Question as to whether chest injury happened on Friday when she hit her head or with a previous fall.  CT imaging of the chest, abdomen, pelvis ordered.  From a respiratory standpoint, patient is no distress.  She has normal oxygen saturations on room air.  Discussed patient with Dr. Almyra Free.   BP 107/75   Pulse 97   Temp 100.3 F (37.9 C) (Oral)   Resp 16   LMP 11/10/2006 (Approximate)   SpO2 96%   8:40 PM There has been a delay in imaging due to being unable to obtain chemistry. CMP now on third re-draw. I have asked for istat creatinine/chem 8 to be drawn but have not yet seen results for this.   9:06 PM Creatinine returned at 0.40.   10:27 PM CT imaging performed with findings as above.  I discussed these findings with Dr. Dema Severin of trauma surgery.  Recommends admission to medicine for observation, they will consult.  I went and had a detailed discussion with the patient's husband and the patient regarding these findings.  Discussed recommendations of inpatient admission due to high risk of worsening, including development of pneumonia, potential worsening hemothorax or pneumothorax requiring emergent intervention.  Patient's husband asks me several questions and does not feel that much benefit will be gained by staying in the hospital.  He states that he would like to monitor patient carefully at home and is willing to return if she gets worse.  We had a prolonged discussion regarding leaving and he does not change his mind.  I did asked that he wait to discuss with trauma surgery and he agrees.   Updated Dr. Dema Severin on discussion. Discussed with Dr. Almyra Free.        MDM Rules/Calculators/A&P                          *** Final Clinical Impression(s) / ED Diagnoses Final diagnoses:  Hemothorax on left  Traumatic pneumothorax, initial encounter  Closed fracture of multiple ribs of left side, initial encounter  Injury of  head, initial encounter    Rx / DC Orders ED Discharge Orders    None

## 2021-03-03 NOTE — ED Notes (Signed)
Patient transported to X-ray 

## 2021-03-03 NOTE — ED Triage Notes (Signed)
Patient is being discharged from the Urgent Care and sent to the Emergency Department via POV . Per Verna Czech, PA, patient is in need of higher level of care due to fall w/ head injury 2 days ago, increased weakness. Patient is aware and verbalizes understanding of plan of care.  Vitals:   03/03/21 1546  BP: (!) 85/59  Pulse: (!) 101  SpO2: 96%

## 2021-03-03 NOTE — ED Notes (Signed)
Patient transported to CT 

## 2021-03-04 ENCOUNTER — Inpatient Hospital Stay (HOSPITAL_COMMUNITY): Payer: Medicare Other

## 2021-03-04 ENCOUNTER — Encounter (HOSPITAL_COMMUNITY): Payer: Self-pay | Admitting: Family Medicine

## 2021-03-04 DIAGNOSIS — M47812 Spondylosis without myelopathy or radiculopathy, cervical region: Secondary | ICD-10-CM | POA: Diagnosis not present

## 2021-03-04 DIAGNOSIS — J9811 Atelectasis: Secondary | ICD-10-CM | POA: Diagnosis not present

## 2021-03-04 DIAGNOSIS — R0902 Hypoxemia: Secondary | ICD-10-CM | POA: Diagnosis not present

## 2021-03-04 DIAGNOSIS — S8012XA Contusion of left lower leg, initial encounter: Secondary | ICD-10-CM | POA: Diagnosis present

## 2021-03-04 DIAGNOSIS — S2220XA Unspecified fracture of sternum, initial encounter for closed fracture: Secondary | ICD-10-CM | POA: Diagnosis present

## 2021-03-04 DIAGNOSIS — S271XXA Traumatic hemothorax, initial encounter: Secondary | ICD-10-CM | POA: Diagnosis not present

## 2021-03-04 DIAGNOSIS — S2249XA Multiple fractures of ribs, unspecified side, initial encounter for closed fracture: Secondary | ICD-10-CM | POA: Diagnosis present

## 2021-03-04 DIAGNOSIS — R269 Unspecified abnormalities of gait and mobility: Secondary | ICD-10-CM | POA: Diagnosis present

## 2021-03-04 DIAGNOSIS — Z7901 Long term (current) use of anticoagulants: Secondary | ICD-10-CM

## 2021-03-04 DIAGNOSIS — S272XXA Traumatic hemopneumothorax, initial encounter: Secondary | ICD-10-CM | POA: Diagnosis present

## 2021-03-04 DIAGNOSIS — J942 Hemothorax: Secondary | ICD-10-CM

## 2021-03-04 DIAGNOSIS — S2242XA Multiple fractures of ribs, left side, initial encounter for closed fracture: Secondary | ICD-10-CM

## 2021-03-04 DIAGNOSIS — K838 Other specified diseases of biliary tract: Secondary | ICD-10-CM | POA: Diagnosis present

## 2021-03-04 DIAGNOSIS — Y92009 Unspecified place in unspecified non-institutional (private) residence as the place of occurrence of the external cause: Secondary | ICD-10-CM

## 2021-03-04 DIAGNOSIS — S60222A Contusion of left hand, initial encounter: Secondary | ICD-10-CM | POA: Diagnosis present

## 2021-03-04 DIAGNOSIS — I4819 Other persistent atrial fibrillation: Secondary | ICD-10-CM | POA: Diagnosis present

## 2021-03-04 DIAGNOSIS — Y92039 Unspecified place in apartment as the place of occurrence of the external cause: Secondary | ICD-10-CM | POA: Diagnosis not present

## 2021-03-04 DIAGNOSIS — G20 Parkinson's disease: Secondary | ICD-10-CM | POA: Diagnosis present

## 2021-03-04 DIAGNOSIS — I4891 Unspecified atrial fibrillation: Secondary | ICD-10-CM | POA: Diagnosis not present

## 2021-03-04 DIAGNOSIS — S270XXA Traumatic pneumothorax, initial encounter: Secondary | ICD-10-CM | POA: Diagnosis not present

## 2021-03-04 DIAGNOSIS — S2242XG Multiple fractures of ribs, left side, subsequent encounter for fracture with delayed healing: Secondary | ICD-10-CM | POA: Diagnosis not present

## 2021-03-04 DIAGNOSIS — K7460 Unspecified cirrhosis of liver: Secondary | ICD-10-CM | POA: Diagnosis present

## 2021-03-04 DIAGNOSIS — E876 Hypokalemia: Secondary | ICD-10-CM | POA: Diagnosis not present

## 2021-03-04 DIAGNOSIS — S60011A Contusion of right thumb without damage to nail, initial encounter: Secondary | ICD-10-CM | POA: Diagnosis present

## 2021-03-04 DIAGNOSIS — J918 Pleural effusion in other conditions classified elsewhere: Secondary | ICD-10-CM | POA: Diagnosis present

## 2021-03-04 DIAGNOSIS — R296 Repeated falls: Secondary | ICD-10-CM | POA: Diagnosis not present

## 2021-03-04 DIAGNOSIS — B181 Chronic viral hepatitis B without delta-agent: Secondary | ICD-10-CM | POA: Diagnosis present

## 2021-03-04 DIAGNOSIS — S20212A Contusion of left front wall of thorax, initial encounter: Secondary | ICD-10-CM | POA: Diagnosis present

## 2021-03-04 DIAGNOSIS — W1830XA Fall on same level, unspecified, initial encounter: Secondary | ICD-10-CM | POA: Diagnosis present

## 2021-03-04 DIAGNOSIS — R64 Cachexia: Secondary | ICD-10-CM | POA: Diagnosis present

## 2021-03-04 DIAGNOSIS — S8011XA Contusion of right lower leg, initial encounter: Secondary | ICD-10-CM | POA: Diagnosis present

## 2021-03-04 DIAGNOSIS — J939 Pneumothorax, unspecified: Secondary | ICD-10-CM | POA: Diagnosis not present

## 2021-03-04 DIAGNOSIS — W19XXXA Unspecified fall, initial encounter: Secondary | ICD-10-CM

## 2021-03-04 DIAGNOSIS — K5900 Constipation, unspecified: Secondary | ICD-10-CM | POA: Diagnosis present

## 2021-03-04 DIAGNOSIS — E43 Unspecified severe protein-calorie malnutrition: Secondary | ICD-10-CM | POA: Diagnosis present

## 2021-03-04 DIAGNOSIS — Z681 Body mass index (BMI) 19 or less, adult: Secondary | ICD-10-CM | POA: Diagnosis not present

## 2021-03-04 DIAGNOSIS — S0003XA Contusion of scalp, initial encounter: Secondary | ICD-10-CM | POA: Diagnosis not present

## 2021-03-04 DIAGNOSIS — Z20822 Contact with and (suspected) exposure to covid-19: Secondary | ICD-10-CM | POA: Diagnosis present

## 2021-03-04 DIAGNOSIS — M810 Age-related osteoporosis without current pathological fracture: Secondary | ICD-10-CM | POA: Diagnosis present

## 2021-03-04 LAB — CBC
HCT: 37.5 % (ref 36.0–46.0)
Hemoglobin: 12.5 g/dL (ref 12.0–15.0)
MCH: 34.6 pg — ABNORMAL HIGH (ref 26.0–34.0)
MCHC: 33.3 g/dL (ref 30.0–36.0)
MCV: 103.9 fL — ABNORMAL HIGH (ref 80.0–100.0)
Platelets: 227 10*3/uL (ref 150–400)
RBC: 3.61 MIL/uL — ABNORMAL LOW (ref 3.87–5.11)
RDW: 12.3 % (ref 11.5–15.5)
WBC: 4.1 10*3/uL (ref 4.0–10.5)
nRBC: 0 % (ref 0.0–0.2)

## 2021-03-04 LAB — RESP PANEL BY RT-PCR (FLU A&B, COVID) ARPGX2
Influenza A by PCR: NEGATIVE
Influenza B by PCR: NEGATIVE
SARS Coronavirus 2 by RT PCR: NEGATIVE

## 2021-03-04 LAB — BASIC METABOLIC PANEL
Anion gap: 6 (ref 5–15)
BUN: 8 mg/dL (ref 8–23)
CO2: 28 mmol/L (ref 22–32)
Calcium: 8.3 mg/dL — ABNORMAL LOW (ref 8.9–10.3)
Chloride: 110 mmol/L (ref 98–111)
Creatinine, Ser: 0.43 mg/dL — ABNORMAL LOW (ref 0.44–1.00)
GFR, Estimated: >60 mL/min (ref >60)
Glucose, Bld: 118 mg/dL — ABNORMAL HIGH (ref 70–99)
Potassium: 3.4 mmol/L — ABNORMAL LOW (ref 3.5–5.1)
Sodium: 144 mmol/L (ref 135–145)

## 2021-03-04 LAB — ABO/RH: ABO/RH(D): A POS

## 2021-03-04 MED ORDER — SENNOSIDES-DOCUSATE SODIUM 8.6-50 MG PO TABS
1.0000 | ORAL_TABLET | Freq: Every evening | ORAL | Status: DC | PRN
  Administered 2021-03-05 – 2021-03-07 (×2): 1 via ORAL
  Filled 2021-03-04 (×2): qty 1

## 2021-03-04 MED ORDER — POTASSIUM CHLORIDE CRYS ER 20 MEQ PO TBCR
40.0000 meq | EXTENDED_RELEASE_TABLET | Freq: Once | ORAL | Status: AC
  Administered 2021-03-04: 40 meq via ORAL
  Filled 2021-03-04: qty 2

## 2021-03-04 MED ORDER — ONDANSETRON HCL 4 MG PO TABS: 4.0000 mg | ORAL_TABLET | Freq: Four times a day (QID) | ORAL | Status: DC | PRN

## 2021-03-04 MED ORDER — GADOBUTROL 1 MMOL/ML IV SOLN
3.5000 mL | Freq: Once | INTRAVENOUS | Status: AC | PRN
  Administered 2021-03-04: 3.5 mL via INTRAVENOUS

## 2021-03-04 MED ORDER — ACETAMINOPHEN 650 MG RE SUPP: 650.0000 mg | Freq: Four times a day (QID) | RECTAL | Status: DC | PRN

## 2021-03-04 MED ORDER — DILTIAZEM HCL-DEXTROSE 125-5 MG/125ML-% IV SOLN (PREMIX)
5.0000 mg/h | INTRAVENOUS | Status: DC
  Filled 2021-03-04 (×2): qty 125

## 2021-03-04 MED ORDER — ACETAMINOPHEN 325 MG PO TABS: 650.0000 mg | ORAL_TABLET | Freq: Four times a day (QID) | ORAL | Status: DC | PRN

## 2021-03-04 MED ORDER — DILTIAZEM HCL 25 MG/5ML IV SOLN
10.0000 mg | Freq: Once | INTRAVENOUS | Status: AC
  Administered 2021-03-04: 10 mg via INTRAVENOUS
  Filled 2021-03-04: qty 5

## 2021-03-04 MED ORDER — ENTECAVIR 1 MG PO TABS: 1.0000 mg | ORAL_TABLET | Freq: Every morning | ORAL | Status: DC

## 2021-03-04 MED ORDER — ONDANSETRON HCL 4 MG/2ML IJ SOLN: 4.0000 mg | Freq: Four times a day (QID) | INTRAMUSCULAR | Status: DC | PRN

## 2021-03-04 MED ORDER — DOCUSATE SODIUM 100 MG PO CAPS
100.0000 mg | ORAL_CAPSULE | Freq: Two times a day (BID) | ORAL | Status: DC
  Administered 2021-03-04 – 2021-03-13 (×18): 100 mg via ORAL
  Filled 2021-03-04 (×18): qty 1

## 2021-03-04 MED ORDER — TRAMADOL HCL 50 MG PO TABS: 50.0000 mg | ORAL_TABLET | Freq: Four times a day (QID) | ORAL | Status: DC | PRN

## 2021-03-04 MED ORDER — ACETAMINOPHEN 500 MG PO TABS: 1000.0000 mg | ORAL_TABLET | Freq: Four times a day (QID) | ORAL | Status: DC | PRN

## 2021-03-04 MED ORDER — HYDROMORPHONE HCL 1 MG/ML IJ SOLN: 0.5000 mg | INTRAMUSCULAR | Status: DC | PRN

## 2021-03-04 MED ORDER — DILTIAZEM HCL 30 MG PO TABS
30.0000 mg | ORAL_TABLET | Freq: Three times a day (TID) | ORAL | Status: DC
  Administered 2021-03-04 (×2): 30 mg via ORAL
  Filled 2021-03-04 (×2): qty 1

## 2021-03-04 MED ORDER — LACTATED RINGERS IV SOLN: INTRAVENOUS | Status: DC

## 2021-03-04 MED ORDER — ALBUTEROL SULFATE (2.5 MG/3ML) 0.083% IN NEBU: 2.5000 mg | INHALATION_SOLUTION | RESPIRATORY_TRACT | Status: DC | PRN

## 2021-03-04 MED ORDER — CARBIDOPA-LEVODOPA 25-100 MG PO TABS: 1.0000 | ORAL_TABLET | Freq: Three times a day (TID) | ORAL | Status: DC

## 2021-03-04 MED ORDER — POLYETHYLENE GLYCOL 3350 17 G PO PACK
17.0000 g | PACK | Freq: Every day | ORAL | Status: DC
  Administered 2021-03-04 – 2021-03-13 (×10): 17 g via ORAL
  Filled 2021-03-04 (×11): qty 1

## 2021-03-04 MED ORDER — DORZOLAMIDE HCL 2 % OP SOLN
1.0000 [drp] | Freq: Two times a day (BID) | OPHTHALMIC | Status: DC
  Administered 2021-03-04 – 2021-03-13 (×12): 1 [drp] via OPHTHALMIC
  Filled 2021-03-04 (×5): qty 10

## 2021-03-04 NOTE — Plan of Care (Signed)
?  Problem: Clinical Measurements: ?Goal: Ability to maintain clinical measurements within normal limits will improve ?Outcome: Progressing ?Goal: Will remain free from infection ?Outcome: Progressing ?Goal: Diagnostic test results will improve ?Outcome: Progressing ?  ?

## 2021-03-04 NOTE — ED Notes (Signed)
Pt states she does not need any oxygen at this time

## 2021-03-04 NOTE — Progress Notes (Signed)
HOSPITAL MEDICINE OVERNIGHT EVENT NOTE     Notified by nursing that patient has gradually developed increasing rapid ventricular rate throughout the course of the afternoon and evening.  This is presumably secondary to missed doses of her usual regimen of Cardizem.  Patient is currently exhibiting heart rates between 150 and 170 bpm appearing to be atrial fibrillation on telemetry.  Obtaining twelve-lead EKG now.  Patient denies chest pain, shortness of breath or any pain whatsoever.  Patient is awake alert and oriented, patient is exhibiting adequate urine output.  Patient was just given 30 mg of oral short acting Cardizem by the bedside nurse.  We will wait 15 minutes or so to see what effect this has on the patient's heart rate but I presume that the patient will likely need a Cardizem infusion.  We will transfer patient to a progressive bed and place orders for Cardizem infusion now.  Continue to monitor patient closely.  Vernelle Emerald  MD [facility]

## 2021-03-04 NOTE — ED Notes (Signed)
MD at bedside evaluating pt.

## 2021-03-04 NOTE — Consult Note (Signed)
NEUROLOGY CONSULTATION NOTE   Date of service: March 04, 2021 Patient Name: Connie Chase MRN:  [id number] DOB:  1946/09/29 Reason for consult: "weakness with multiple falls" _ _ _   _ __   _ __ _ _  __ __   _ __   __ _  History of Present Illness  Connie Chase is a 75 y.o. female with PMH significant for  has a past medical history of Allergic dermatitis due to ragweed, Chronic viral hepatitis B without delta-agent ([place]), Cirrhosis of liver ([place]), Fibroid (2004, 2016), Hemorrhage anterior chamber eye, Hepatitis B infection, Osteoarthritis ( ), Osteoporosis, Osteoporosis, Persistent atrial fibrillation ([place]), and Prediabetes. who presents with for evaluation of left chest wall pain with shortness of breath with exertion.  Patient has had multiple falls over the last 4 to 5 days.  She states that 2 nights ago she fell very hard landing on her left side on the hardwood floor of her kitchen. She was found to have Left rib fractures 5-10 and hemopneumothorax.  She reports that she has had weakness for several years now that has been progressive. She reports that over the last month or so she has had significant progression of her weakness. Her husband reports that before she was able to walk around the house by herself but that for the last year he had to retire to help take care of her because of her weakness. She can now only walk up a couple of stairs before she is too weak. She reports that she is more tired the last few years.  On chart review her last MRI was 06/2018. This revealed- Abnormal MRI scan of the brain showing area of encephalomalacia, gliosis in the right frontal parasagittal region which may represent remote age infarct or trauma. There are mild changes of chronic microvascular ischemia and moderate degree of generalized cerebral atrophy which is slightly age disproportionate. There are also incidental changes of chronic paranasal sinusitis. No acute abnormalities noted.  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 She had no response and so stopped it. She reports no side effects from it.   ROS   Constitutional Denies  fever and chills.   HEENT Denies changes in vision and hearing.   Respiratory Has some SOB with deep breaths  CV Denies palpitations and CP   GI Denies abdominal pain, nausea, vomiting and diarrhea.   GU Denies dysuria and urinary frequency.   MSK Denies myalgia and joint pain.   Skin Denies rash and pruritus.   Neurological Denies headache and syncope.   Psychiatric Denies recent changes in mood. Denies anxiety and depression.    Past History   Past Medical History:  Diagnosis Date  . 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 Chronic viral hepatitis B without delta-agent ([place])   . Cirrhosis of liver ([employer])   . Fibroid 2004, 2016  . Hemorrhage anterior chamber eye    On pradaxa  . Hepatitis B infection    Chronic Hep B carrier, on suppressive Tx  . Osteoarthritis        . Osteoporosis   . Osteoporosis   . Persistent atrial fibrillation ([facility])    a. s/p DCCV b. intolerant  of Flecainide  . Prediabetes    Past Surgical History:  Procedure Laterality Date  . ABLATION  09/06/2015  . CARDIAC ELECTROPHYSIOLOGY MAPPING AND ABLATION  06/2015  . CARDIOVERSION  04/02/2012   Procedure: CARDIOVERSION;  Surgeon: Deboraha Sprang, MD;  Location: [place];  Service: Cardiovascular;  Laterality: N/A;  . CARDIOVERSION N/A 09/12/2013   Procedure: CARDIOVERSION;  Surgeon: Deboraha Sprang, MD;  Location: [place];  Service: Cardiovascular;  Laterality: N/A;  . CARDIOVERSION N/A 01/26/2014   Procedure: CARDIOVERSION;  Surgeon: Deboraha Sprang, MD;   Location: [facility] ENDOSCOPY;  Service: Cardiovascular;  Laterality: N/A;  . CARDIOVERSION N/A 03/30/2015   Procedure: CARDIOVERSION;  Surgeon: Thompson Grayer, MD;  Location: [facility] ENDOSCOPY;  Service: Cardiovascular;  Laterality: N/A;  . CATARACT EXTRACTION W/ INTRAOCULAR LENS IMPLANT Left 03/2011  . CATARACT EXTRACTION W/ INTRAOCULAR LENS IMPLANT Right 2013  . RADIOFREQUENCY ABLATION     Family History  Problem Relation Age of Onset  . Osteoporosis Mother   . Other Other        Neg cardiac family Hx  . Neuromuscular disorder Neg Hx    Social History   Socioeconomic History  . Marital status: Married    Spouse name: Not on file  . Number of children: 2  . Years of education: Not on file  . Highest education level: Bachelor's degree (e.g., BA, AB, BS)  Occupational History  . Not on file  Tobacco Use  . Smoking status: Never Smoker  . Smokeless tobacco: Never Used  Vaping Use  . Vaping Use: Never used  Substance and Sexual Activity  . Alcohol use: No    Alcohol/week: 0.0 standard drinks  . Drug use: No  . Sexual activity: Not Currently    Partners: Male    Birth control/protection: Other-see comments    Comment: husband had vasectomy   Other Topics Concern  . Not on file  Social History Narrative   Lives at home with her husband   Right handed   Regular exercise on a stationary bike   Caffeine: none   Social Determinants of Health   Financial Resource Strain: Not on file  Food Insecurity: Not on file  Transportation Needs: Not on file  Physical Activity: Not on file  Stress: Not on file  Social Connections: Not on file   Allergies  Allergen Reactions  . Fosamax [Alendronate]     Other reaction(s): Muscle and joint pain    Medications   Medications Prior to Admission  Medication Sig Dispense Refill Last Dose  . apixaban (ELIQUIS) 2.5 MG TABS tablet Take 2.5 mg by mouth 2 (two) times daily.   03/03/2021 at 0700  . b complex vitamins capsule Take 1 capsule by mouth  daily.   03/02/2021  . cephALEXin (KEFLEX) 500 MG capsule Take 500 mg by mouth 2 (two) times daily.   03/02/2021  . cholecalciferol (VITAMIN D) 1000 UNITS tablet Take 1,000 Units by mouth daily with lunch.   03/03/2021 at Unknown time  . Cholecalciferol (VITAMIN D) 50 MCG (2000 UT) tablet Take 2,000 Units by mouth every morning.   03/03/2021  . diltiazem (CARDIZEM CD) 120 MG 24 hr capsule TAKE 1 CAPSULE (120 MG TOTAL) BY MOUTH DAILY. (Patient taking differently: Take 120 mg by mouth at bedtime.) 60 capsule 0 03/02/2021  . diltiazem (CARDIZEM) 30 MG tablet Take 30 mg by mouth 2 (two) times daily. Morning and lunch   03/02/2021  . entecavir (BARACLUDE) 1 MG tablet Take 1 mg by  mouth in the morning.   03/03/2021 at Unknown time  . KLOR-CON M20 20 MEQ tablet TAKE 1 TABLET (20 MEQ TOTAL) BY MOUTH DAILY. (Patient taking differently: Take 20 mEq by mouth daily.) 60 tablet 0 03/03/2021 at Unknown time  . magnesium oxide (MAG-OX) 400 MG tablet Take 400 mg by mouth at bedtime.   03/02/2021  . Multiple Vitamins-Minerals (ICAPS AREDS 2 PO) Take 1 tablet by mouth every morning.   03/02/2021  . Omega-3 Fatty Acids (OMEGA 3 PO) Take 1 capsule by mouth 2 (two) times daily.    03/03/2021 at Unknown time  . risedronate (ACTONEL) 150 MG tablet TAKE 1 TABLET EVERY 30 DAYS WITH WATER ON EMPTY STOMACH NOTHING BY MOUTH OR LIE DOWN FOR NEXT 30 MIN (Patient taking differently: Take 150 mg by mouth every 30 (thirty) days. TAKE WITH WATER ON EMPTY STOMACH NOTHING BY MOUTH OR LIE DOWN FOR NEXT 30 MIN) 1 tablet 2 02/08/2021  . dofetilide (TIKOSYN) 125 MCG capsule Take 1 capsule by mouth daily.     . dorzolamide (TRUSOPT) 2 % ophthalmic solution Place 1 drop into both eyes 2 (two) times daily.    03/02/2021     Vitals   Vitals:   03/04/21 0913 03/04/21 1150 03/04/21 1245 03/04/21 1456  BP: 116/78 111/82 120/78 122/83  Pulse: (!) 106 (!) 123 (!) 109 93  Resp: 16     Temp: 98.6 F (37 C)     TempSrc: Oral     SpO2: 97%     Weight:       Height:         Body mass index is 15.39 kg/m.  Physical Exam   General: Laying comfortably in bed; in no acute distress.  HENT: Normal oropharynx and mucosa. Normal external appearance of ears and nose.  Neck: Supple, no pain or tenderness  CV: No JVD. No peripheral edema.  Pulmonary: Symmetric Chest rise. Normal respiratory effort.  Abdomen: Soft to touch, non-tender.  Ext: No cyanosis, edema, or deformity  Skin: No rash. Normal palpation of skin.   Musculoskeletal: Normal digits and nails by inspection. No clubbing.   Neurologic Examination  Mental status/Cognition: Alert, oriented to self, place, month and year, good attention.  Speech/language: Fluent, comprehension intact, object naming intact, repetition intact.  Cranial nerves:   CN II Pupils equal and reactive to light, no VF deficits    CN III,IV,VI EOM intact, Left sided amblyopia present   CN V normal sensation in V1, V2, and V3 segments bilaterally    CN VII Has Left sided facial droop and bilateral proptosis present   CN VIII normal hearing to speech    CN IX & X normal palatal elevation, no uvular deviation    CN XI 5/5 head turn and 5/5 shoulder shrug bilaterally    CN XII midline tongue protrusion    Motor:  Able to move all extremities against gravity Has generalized weakness with Left > Right Decreased grip strength with Left > Right Muscle bulk: decreased, tone mildly increased, no pronator drift or tremor present  Reflexes:  Right Left Comments  Pectoralis      Biceps (C5/6)     Brachioradialis (C5/6)      Triceps (C6/7)      Patellar (L3/4) 1+ 1+    Achilles (S1) 0 0    Hoffman      Plantar Absent Absent   Jaw jerk    Sensation:  Light touch Intact bilaterally   Pin prick  Temperature Intact bilaterally   Vibration   Proprioception    Coordination/Complex Motor:  Did not assess due to pain  Labs   CBC:  Recent Labs  Lab 03/03/21 1629 03/04/21 0532  WBC 6.2 4.1  NEUTROABS 4.9   --   HGB 12.6 12.5  HCT 36.9 37.5  MCV 102.8* 103.9*  PLT 225 578    Basic Metabolic Panel:  Lab Results  Component Value Date   NA 144 03/04/2021   K 3.4 (L) 03/04/2021   CO2 28 03/04/2021   GLUCOSE 118 (H) 03/04/2021   BUN 8 03/04/2021   CREATININE 0.43 (L) 03/04/2021   CALCIUM 8.3 (L) 03/04/2021   GFRNONAA >60 03/04/2021   GFRAA 109 10/15/2020   Lipid Panel: No results found for: LDLCALC HgbA1c: No results found for: HGBA1C Urine Drug Screen: No results found for: LABOPIA, COCAINSCRNUR, LABBENZ, AMPHETMU, THCU, LABBARB  Alcohol Level No results found for: [place]  CT Head without contrast: No acute intracranial process  MRI Brain - ordered   Impression   Connie Chase is a 75 y.o. female with PMH significant for atrial fibrillation on eliquis, Osteoporosis. Her neurologic examination is notable for significant muscular weakness. She reports Right > Left sided weakness but on testing has Left> Right weakness. At this time the exact cause of her neurological deficits is unknown. As she has not had imaging in 2 years and given sudden progression will repeat imaging. If no acute process is found recommend continued outpatient follow up and workup.   Recommendations  -MR Brain and Cervical Spine W WO Contrast ordered  -Continue outpatient workup with repeat EMG -Trial Sinemet again but allow time to get to therapeutic dose ______________________________________________________________________   Fatima Sanger MD Resident

## 2021-03-04 NOTE — Progress Notes (Addendum)
2117 Pt HR sustained between 140s-150s, Bp 145/78, RR 18, O2 94. Pt denies symptoms. Pt denies pain. Initially there was a confusion in which pt's husband stated that he had given her Cardizem already so RN shouldn't administer another dose but it was later established that he hadn't given so RN administered 30 mg Cardizem. Charge nurse was present during encounter. G. Shalhoub,MD notified.RN will continue to monitor pt. Awaiting orders  2138 Orders received. Vitals to be checked in 2mins.  2211 Pt Bp 119/73, P(Dinamap)-97,Tele 120s-130s,O2-96. Pt denies symptoms. Pt denies pain. G. Shalhoub,MD notified. RN will continue to monitor patient.  2259 MD called back. Orders put in to transfer pt to progressive. RN will continue to monitor pt.  1151 Call to 2W to give report no answer.  0130 Pt transported 2W21. Pt denies pain. Pt denies any symptoms. Pt belongings transported with her.  EKG done given to receiving nurse Quin Hoop, RN.

## 2021-03-04 NOTE — H&P (Signed)
History and Physical    Connie Chase GMW:[id number] DOB: January 29, 1946 DOA: 03/03/2021  PCP: Jonathon Jordan, MD   Patient coming from:  Home  Chief Complaint: Fall at home with left chest wall pain and SOB.   HPI: Connie Chase is a 75 y.o. female with medical history significant for atrial fibrillation on eliquis, Osteoporosis who presents for evaluation of left chest wall pain with shortness of breath with exertion.  Patient has had multiple falls over the last 4 to 5 days.  She states that 2 nights ago she fell very hard landing on her left side on the hardwood floor of her kitchen.  Feels weaker than she normally is and has had increasing difficulty ambulating the last few days.  She fell from a standing position.  She states she did hit her head but did not have any loss of consciousness.  She has not had any headache, nausea vomiting, neck stiffness or confusion since the fall.  She reports she has been eating and drinking normally.  She denies any chest pain or palpitations.  She denies any abdominal pain, urinary frequency or dysuria.  She has not had any fever or recent illness.  She states her legs are sore from bruising from the falls.  She states her left lateral chest wall hurts which is exacerbated by deep inspiration or twisting of her body.  ED Course: Hemodynamically stable in the emergency room.  She is found to have multiple rib fractures on CT of her chest.  She has no abdominal pathology on CT.  She has a pneumohemothorax on the left.  She has been seen by trauma surgery who asked that hospitalist service admit for further management.  Surgery will follow.  Review of Systems:  General: Denies fever, chills, weight loss, night sweats.  Denies dizziness.  Denies change in appetite HENT: Denies head trauma, headache, denies change in hearing, tinnitus.  Denies nasal congestion or bleeding.  Denies sore throat, sores in mouth.  Denies difficulty swallowing Eyes: Denies blurry vision,  pain in eye, drainage.  Denies discoloration of eyes. Neck: Denies pain.  Denies swelling.  Denies pain with movement. Cardiovascular: Denies chest pain, palpitations.  Denies edema.  Denies orthopnea Respiratory: Reports shortness of breath exertion.  Denies cough.  Denies wheezing.  Denies sputum production Gastrointestinal: Denies abdominal pain, swelling.  Denies nausea, vomiting, diarrhea.  Denies melena.  Denies hematemesis. Musculoskeletal: Denies limitation of movement.  Denies deformity or swelling.  Denies pain.  Denies arthralgias or myalgias. Genitourinary: Denies pelvic pain.  Denies urinary frequency or hesitancy.  Denies dysuria.  Skin: Denies rash.  Denies petechiae, purpura, ecchymosis. Neurological: Denies headache.  Denies syncope.  Denies seizure activity.  Denies weakness or paresthesia.  Denies slurred speech Psychiatry: Denies depression and anxiety.  Denies hallucinations.  Past Medical History:  Diagnosis Date  . 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 Chronic viral hepatitis B without delta-agent ([place])   . Cirrhosis of liver ([place])   . Fibroid 2004, 2016  . Hemorrhage anterior chamber eye    On pradaxa  . Hepatitis B infection    Chronic Hep B carrier, on suppressive Tx  . Osteoarthritis        . Osteoporosis   . Osteoporosis   . Persistent atrial fibrillation (HCC)    a. s/p DCCV b. intolerant of Flecainide  . Prediabetes     Past Surgical History:  Procedure Laterality Date  . ABLATION  09/06/2015  . CARDIAC ELECTROPHYSIOLOGY MAPPING AND ABLATION  06/2015  . CARDIOVERSION  04/02/2012   Procedure: CARDIOVERSION;  Surgeon: Deboraha Sprang, MD;  Location: [place];  Service: Cardiovascular;  Laterality: N/A;  . CARDIOVERSION N/A 09/12/2013   Procedure: CARDIOVERSION;  Surgeon: Deboraha Sprang, MD;  Location: [place];  Service: Cardiovascular;  Laterality: N/A;  . CARDIOVERSION N/A 01/26/2014   Procedure: CARDIOVERSION;  Surgeon: Deboraha Sprang, MD;   Location: [facility] ENDOSCOPY;  Service: Cardiovascular;  Laterality: N/A;  . CARDIOVERSION N/A 03/30/2015   Procedure: CARDIOVERSION;  Surgeon: Thompson Grayer, MD;  Location: [facility] ENDOSCOPY;  Service: Cardiovascular;  Laterality: N/A;  . CATARACT EXTRACTION W/ INTRAOCULAR LENS IMPLANT Left 03/2011  . CATARACT EXTRACTION W/ INTRAOCULAR LENS IMPLANT Right 2013  . RADIOFREQUENCY ABLATION      Social History  reports that she has never smoked. She has never used smokeless tobacco. She reports that she does not drink alcohol and does not use drugs.  No Known Allergies  Family History  Problem Relation Age of Onset  . Osteoporosis Mother   . Other Other        Neg cardiac family Hx  . Neuromuscular disorder Neg Hx      Prior to Admission medications   Medication Sig Start Date End Date Taking? Authorizing Provider  apixaban (ELIQUIS) 2.5 MG TABS tablet Take 2.5 mg by mouth 2 (two) times daily.    [provider]  b complex vitamins capsule Take 1 capsule by mouth daily.    [provider]  beta carotene w/minerals (OCUVITE) tablet Take 1 tablet by mouth daily.     [provider]  carbidopa-levodopa (SINEMET IR) 25-100 MG tablet Take 1 tablet by mouth 3 (three) times daily. 04/19/20   [provider]  cholecalciferol (VITAMIN D) 1000 UNITS tablet Take by mouth daily. 2000 units at breakfast and 1000 units at lunch    [provider]  diltiazem (CARDIZEM CD) 120 MG 24 hr capsule TAKE 1 CAPSULE (120 MG TOTAL) BY MOUTH DAILY. 02/12/16   Deboraha Sprang, MD  diltiazem (CARDIZEM) 30 MG tablet Take 30 mg by mouth as needed.  10/22/16   [provider]  dofetilide (TIKOSYN) 125 MCG capsule Take 1 capsule by mouth daily. 05/19/16   [provider]  dorzolamide (TRUSOPT) 2 % ophthalmic solution Place 1 drop into both eyes 2 (two) times daily.  05/09/15   [provider]  entecavir (BARACLUDE) 1 MG tablet Take 1 mg by mouth daily.    [provider]  KLOR-CON M20 20 MEQ tablet TAKE 1 TABLET (20 MEQ TOTAL) BY MOUTH DAILY. 02/12/16   Deboraha Sprang, MD  magnesium oxide (MAG-OX) 400 MG tablet Take 400 mg by mouth at bedtime. 06/18/15   [provider]  Multiple Vitamins-Minerals (CENTRUM SILVER PO) Take 1 tablet by mouth daily.     [provider]  Omega-3 Fatty Acids (OMEGA 3 PO) Take 1 capsule by mouth 2 (two) times daily.     [provider]  risedronate (ACTONEL) 150 MG tablet TAKE 1 TABLET EVERY 30 DAYS WITH WATER ON EMPTY STOMACH NOTHING BY MOUTH OR LIE DOWN FOR NEXT 30 MIN 10/15/20   Nunzio Cobbs, MD    Physical Exam: Vitals:   03/03/21 2000 03/03/21 2041 03/03/21 2154 03/03/21 2308  BP: 114/79 113/72 99/67 116/79  Pulse: (!) 102 93 (!) 102 (!) 106  Resp: 14 17 15 14   Temp:  98.4 F (36.9 C) 98.1 F (36.7 C) 98.4 F (36.9  C)  TempSrc:  Oral Oral Oral  SpO2: 95% 97% 97% 96%    Constitutional: NAD, calm, comfortable Vitals:   03/03/21 2000 03/03/21 2041 03/03/21 2154 03/03/21 2308  BP: 114/79 113/72 99/67 116/79  Pulse: (!) 102 93 (!) 102 (!) 106  Resp: 14 17 15 14   Temp:  98.4 F (36.9 C) 98.1 F (36.7 C) 98.4 F (36.9 C)  TempSrc:  Oral Oral Oral  SpO2: 95% 97% 97% 96%   General: WDWN, Alert and oriented x3.  Eyes: EOMI, PERRL, conjunctivae normal.  Sclera nonicteric HENT:  [place]/AT, external ears normal.  Nares patent without epistasis.  Mucous membranes are moist. Neck: Soft, normal range of motion, supple, no masses, Trachea midline Respiratory: Diminished breath sounds in left upper lung field, no wheezing, no crackles. Normal respiratory effort. No accessory muscle use.  Cardiovascular: Irregularly irregular rhythm. no murmurs / rubs / gallops. No extremity edema. 2+ pedal pulses.  Abdomen: Soft, no tenderness, nondistended, no rebound or guarding. No masses palpated. No hepatosplenomegaly. Bowel sounds normoactive Musculoskeletal: FROM.  Mild contusion of  bilateral shins.  Ecchymosis of right thumb.  Left lateral chest wall contusion noted.  Left lateral chest wall tender to palpation without crepitus.  No cyanosis. No joint deformity upper and lower extremities. Normal muscle tone.  Skin: Warm, dry, intact no rashes, lesions, ulcers. No induration Neurologic: CN 2-12 grossly intact.  Normal speech.  Sensation intact, Strength 4/5 in all extremities.   Psychiatric: Normal judgment and insight.  Normal mood.    Labs on Admission: I have personally reviewed following labs and imaging studies  CBC: Recent Labs  Lab 03/03/21 1629  WBC 6.2  NEUTROABS 4.9  HGB 12.6  HCT 36.9  MCV 102.8*  PLT 678    Basic Metabolic Panel: Recent Labs  Lab 03/03/21 2034 03/03/21 2055  NA 140  --   K 4.0  --   CL 108  --   CO2 23  --   GLUCOSE 122*  --   BUN 13  --   CREATININE 0.44 0.40*  CALCIUM 8.6*  --     GFR: CrCl cannot be calculated (Unknown ideal weight.).  Liver Function Tests: Recent Labs  Lab 03/03/21 2034  AST 32  ALT 24  ALKPHOS 42  BILITOT 0.6  PROT 5.6*  ALBUMIN 2.9*    Urine analysis:    Component Value Date/Time   COLORURINE YELLOW 03/03/2021 2111   APPEARANCEUR CLOUDY (A) 03/03/2021 2111   LABSPEC 1.008 03/03/2021 2111   PHURINE 9.0 (H) 03/03/2021 2111   GLUCOSEU NEGATIVE 03/03/2021 2111   HGBUR NEGATIVE 03/03/2021 2111   BILIRUBINUR NEGATIVE 03/03/2021 2111   [place] NEGATIVE 03/03/2021 2111   PROTEINUR NEGATIVE 03/03/2021 2111   NITRITE NEGATIVE 03/03/2021 2111   LEUKOCYTESUR NEGATIVE 03/03/2021 2111    Radiological Exams on Admission: DG Chest 2 View  Result Date: 03/03/2021 CLINICAL DATA:  Weakness.  Fall.  Fever. EXAM: CHEST - 2 VIEW COMPARISON:  Most recent chest radiograph 01/22/2010 FINDINGS: Patient is rotated. Left pleural effusion is at least moderate in size. Small left apical pneumothorax, visualized between the posterior second and third rib. Right lung is clear. Heart size grossly normal.  Bones are diffusely under mineralized. Mildly displaced fracture of left lateral sixth rib. IMPRESSION: 1. Small left apical pneumothorax and at least moderate left pleural effusion. 2. Mildly displaced left lateral sixth rib fracture. These results were called by telephone at the time of interpretation on 03/03/2021 at 5:25 pm to provider [facility]  GEIPLE , who verbally acknowledged these results. Electronically Signed   By: Keith Rake M.D.   On: 03/03/2021 17:25   CT Head Wo Contrast  Result Date: 03/03/2021 CLINICAL DATA:  Fall with head trauma and bleeding. EXAM: CT HEAD WITHOUT CONTRAST TECHNIQUE: Contiguous axial images were obtained from the base of the skull through the vertex without intravenous contrast. COMPARISON:  Report from a brain MR dated 07/06/2018. FINDINGS: Brain: No evidence of acute infarction, hemorrhage, hydrocephalus, extra-axial collection or mass lesion/mass effect. There is mild cerebral volume loss with associated ex vacuo dilatation. Periventricular white matter hypoattenuation likely represents chronic small vessel ischemic disease. Encephalomalacia is seen in the right frontal parasagittal region. Vascular: There are vascular calcifications in the carotid siphons. Skull: Normal. Negative for fracture or focal lesion. Sinuses/Orbits: There is chronic sinusitis of the left sphenoid sinus. Other: Scalp soft tissue swelling is seen overlying the right parietal bone. IMPRESSION: 1. No acute intracranial process. Electronically Signed   By: Zerita Boers M.D.   On: 03/03/2021 17:35   CT CHEST ABDOMEN PELVIS W CONTRAST  Result Date: 03/03/2021 CLINICAL DATA:  Rib fracture with traumatic hemopneumothorax. Fall 2 days ago with muscle weakness. EXAM: CT CHEST, ABDOMEN, AND PELVIS WITH CONTRAST TECHNIQUE: Multidetector CT imaging of the chest, abdomen and pelvis was performed following the standard protocol during bolus administration of intravenous contrast. CONTRAST:  157mL OMNIPAQUE  IOHEXOL 300 MG/ML  SOLN COMPARISON:  Chest radiograph earlier today. FINDINGS: CT CHEST FINDINGS Cardiovascular: No acute aortic or vascular injury. Mild aortic atherosclerosis. Upper normal heart size. There are coronary artery calcifications. No pericardial effusion. Mediastinum/Nodes: No mediastinal hemorrhage or hematoma. No pneumomediastinum. Heterogeneous thyroid gland without dominant nodule by CT. Patulous esophagus. Lungs/Pleura: Hemo pneumothorax, with moderate fluid component and small pneumothorax component. Pneumothorax component is less than 10%, greatest at the apex. No active extravasation within hemothorax. There is associated compressive atelectasis in the left lower lobe. Lingular consolidation is also likely atelectasis. No right pneumothorax. Trachea and central bronchi are patent. Musculoskeletal: Left lateral rib fractures 5 through 10, many of which are mildly displaced and comminuted. Tenth rib fracture may be subacute. The seventh rib fracture is segmental. No right rib fracture. Buckling of the lower sternal body with sclerosis suggestive of subacute or chronic sternal body fracture. No acute fracture of the thoracic spine. No acute fracture of the included clavicles or shoulder girdles. There is left chest wall soft tissue edema as well as intramuscular edema of the intercostal cell. No subcutaneous gas. CT ABDOMEN PELVIS FINDINGS Hepatobiliary: No hepatic injury or perihepatic hematoma. Tiny cyst in the right hepatic dome. Mild hepatic steatosis. Prominent common bile duct measuring 8 mm. No visualized choledocholithiasis. Gallbladder is unremarkable. Pancreas: No evidence of injury. No ductal dilatation or inflammation. Spleen: No splenic injury or perisplenic hematoma. Tiny low-density splenic lesions are too small to characterize, likely cysts. Adrenals/Urinary Tract: No adrenal hemorrhage or renal injury identified. Punctate right adrenal calcification likely sequela of prior  hemorrhage. Small left renal cyst. Bladder is unremarkable. Stomach/Bowel: No obvious bowel injury, wall thickening or inflammation. Decompressed stomach. Large volume of stool throughout the colon. There is fecalization of distal small bowel contents. Normal appendix. Vascular/Lymphatic: No acute vascular injury. Aortic atherosclerosis and tortuosity. Retroperitoneal fluid. No bulky adenopathy. Reproductive: Retroverted uterus which is mildly bulky and contains scattered calcifications, suggestive of fibroids. No visualized adnexal mass. Other: No intra-abdominal free air or free fluid. Musculoskeletal: No fracture of the lumbar spine or pelvis. Scoliosis and degenerative change throughout  the lumbar spine. IMPRESSION: 1. Left lateral rib fractures 5 through 10, many of which are mildly displaced and comminuted. The seventh rib fracture is segmental. Tenth rib fracture may be subacute. 2. Hemo pneumothorax with moderate fluid component and small pneumothorax component. Pneumothorax component is less than 10%, greatest at the apex. No active extravasation. 3. Buckling of the lower sternal body with sclerosis suggestive of subacute or chronic sternal body fracture. 4. No evidence of acute traumatic injury to the abdomen or pelvis. 5. Large volume of stool throughout the colon with fecalization of distal small bowel contents, suggesting constipation. 6. Prominent common bile duct measuring 8 mm, no visualized choledocholithiasis. Recommend correlation with LFTs. If LFTs are elevated or there is clinical concern for biliary pathology, consider further evaluation with MRCP, on an elective basis after resolution of acute event. Aortic Atherosclerosis (ICD10-I70.0). Electronically Signed   By: Keith Rake M.D.   On: 03/03/2021 21:41    EKG: Independently reviewed.  EKG shows atrial fibrillation.  No acute ST elevation or depression.  QTc 375  Assessment/Plan Principal Problem:   Hemopneumothorax on left Ms.  Hudman is admitted to medical telemetry floor.  Continue supplemental oxygen to keep O2 sat 92-96%.  Repeat PA and Lat CXR in am.  Surgery following.  Incentive spirometer every hour.   Active Problems:   Multiple rib fractures Pain control provided.     Persistent atrial fibrillation  Stable. Hold eliquis    Chronic anticoagulation eliquis held. Will need to assess and discuss risk vs benefits of anticoagulation with pt having falls with pt and her family.     Fall at home, initial encounter Consult PT for evaluation.     DVT prophylaxis: SCDs for DVT prophylaxis.  Eliquis is held with hemopneumothorax Code Status:   Full code Family Communication:  Diagnosis and plan discussed with patient.  She verbalized understanding agrees with plan.  Further recommendations to follow as clinically indicated Disposition Plan:   Patient is from:  Home  Anticipated DC to:  Home vs rehab, will be determined her hospitalization  Anticipated DC date:  Anticipate more than 2 midnight stay in the hospital  Anticipated DC barriers: If rehab bed is required, obtaining a bed could be a barrier to discharge  Consults called:  Surgery, Dr. Dema Severin Admission status:  Inpatient   Yevonne Aline Jonia Oakey MD [facility]  How to contact the [facility] Attending or Consulting provider [place] or covering provider during after hours [place], for this patient?   1. Check the care team in [facility] and look for a) attending/consulting [facility] provider listed and b) the [facility] team listed 2. Log into [email or web address] and use [place]'s universal password to access. If you do not have the password, please contact the hospital operator. 3. Locate the [facility] provider you are looking for under [facility] and page to a number that you can be directly reached. 4. If you still have difficulty reaching the provider, please page the [facility] (Director on Call) for the Hospitalists listed on amion for assistance.  03/04/2021, 1:10 AM

## 2021-03-04 NOTE — Progress Notes (Signed)
Physical Therapy Evaluation Patient Details Name: Connie Chase MRN: [id number] DOB: 1946/02/05 Today's Date: 03/04/2021   History of Present Illness  75 yo female with onset of falls at home on hard floors was admitted due to SOB, found to have hemopneumothorax and L side rib fractures from 5-10, due to legs giving out.  Has been struggling with gait, having increased difficulty in last few days prior to admission.  PMHx:  viral Hep B, cirrhosis, fibroids, osteoporosis, a-fib, prediabetes, atherosclerosis, cardioversion, ablation, cataract repair, OA  Clinical Impression  Pt was seen for mobility on RW during which she was able to maneuver with short distances and min assist, but also a short length of steps.  Pt is not safely able to get up stairs to get home, and talked with pt and husband about the options for her care.  Pt and husband are interested in seeing about SNF care, and would benefit to recover her independence for this task.  Pt is motivated and trying very hard to climb stairs, and will anticipate her timely recovery of mobility to go home from SNF.  Follow acutely for goals of PT.    Follow Up Recommendations SNF    Equipment Recommendations  3in1 (PT) (has walker at home)    Recommendations for Other Services       Precautions / Restrictions Precautions Precautions: Fall Precaution Comments: urinary frequency Restrictions Weight Bearing Restrictions: No      Mobility  Bed Mobility Overal bed mobility: Needs Assistance Bed Mobility: Supine to Sit     Supine to sit: Min assist     General bed mobility comments: assisted to side of bed with bed pad    Transfers Overall transfer level: Needs assistance Equipment used: Rolling walker (2 wheeled);1 person hand held assist Transfers: Sit to/from Stand Sit to Stand: Min assist         General transfer comment: reminders needed 100% of trials for hand placement  Ambulation/Gait Ambulation/Gait assistance: Min  assist Gait Distance (Feet): 5 Feet Assistive device: Rolling walker (2 wheeled);1 person hand held assist Gait Pattern/deviations: Step-through pattern;Step-to pattern;Decreased stride length;Trunk flexed;Wide base of support Gait velocity: reduced Gait velocity interpretation: <1.8 ft/sec, indicate of risk for recurrent falls General Gait Details: pt requires cues for maintaining control of walker and to use walker through completion of task to back to a chair  Stairs Stairs: Yes Stairs assistance: Min assist Stair Management: One rail Right;One rail Left;Forwards;Backwards Number of Stairs: 2 (x 2) General stair comments: weakly stepping up but cannot do more than 2 steps  Wheelchair Mobility    Modified Rankin (Stroke Patients Only)       Balance Overall balance assessment: Needs assistance Sitting-balance support: Feet supported Sitting balance-Leahy Scale: Fair     Standing balance support: Bilateral upper extremity supported;During functional activity Standing balance-Leahy Scale: Poor                               Pertinent Vitals/Pain Pain Assessment: No/denies pain    Home Living Family/patient expects to be discharged to:: Private residence Living Arrangements: Spouse/significant other Available Help at Discharge: Family Type of Home: Apartment Home Access: Stairs to enter Entrance Stairs-Rails: Right Entrance Stairs-Number of Steps: 12 Home Layout: One level Home Equipment: Environmental consultant - 2 wheels Additional Comments: husband asking for [facility]    Prior Function Level of Independence: Independent with assistive device(s) (prior to falls)  Hand Dominance   Dominant Hand: Right    Extremity/Trunk Assessment   Upper Extremity Assessment Upper Extremity Assessment: Generalized weakness    Lower Extremity Assessment Lower Extremity Assessment: Generalized weakness    Cervical / Trunk Assessment Cervical / Trunk Assessment:  Kyphotic  Communication   Communication: No difficulties  Cognition Arousal/Alertness: Awake/alert Behavior During Therapy: WFL for tasks assessed/performed Overall Cognitive Status: Within Functional Limits for tasks assessed                                 General Comments: attending to instructions but a little impulsive with transfers      General Comments General comments (skin integrity, edema, etc.): Pt was in bed with purwick but did not use the device, however could use BSC x 2 when OOB to walk and try stairs    Exercises     Assessment/Plan    PT Assessment Patient needs continued PT services  PT Problem List Decreased strength;Decreased range of motion;Decreased activity tolerance;Decreased balance;Decreased mobility;Decreased coordination;Decreased knowledge of use of DME;Decreased safety awareness       PT Treatment Interventions DME instruction;Gait training;Stair training;Functional mobility training;Therapeutic activities;Therapeutic exercise;Balance training;Neuromuscular re-education;Patient/family education    PT Goals (Current goals can be found in the Care Plan section)  Acute Rehab PT Goals Patient Stated Goal: to walk and get up stairs PT Goal Formulation: With patient/family Time For Goal Achievement: 03/11/21 Potential to Achieve Goals: Good    Frequency Min 3X/week   Barriers to discharge Inaccessible home environment;Decreased caregiver support requires help to get up stairs and husband is alone with her    Co-evaluation               AM-PAC PT "6 Clicks" Mobility  Outcome Measure Help needed turning from your back to your side while in a flat bed without using bedrails?: A Little Help needed moving from lying on your back to sitting on the side of a flat bed without using bedrails?: A Little Help needed moving to and from a bed to a chair (including a wheelchair)?: A Little Help needed standing up from a chair using your  arms (e.g., wheelchair or bedside chair)?: A Little Help needed to walk in hospital room?: A Little Help needed climbing 3-5 steps with a railing? : A Lot 6 Click Score: 17    End of Session Equipment Utilized During Treatment: Gait belt Activity Tolerance: Patient limited by fatigue;Treatment limited secondary to medical complications (Comment) Patient left: in chair;with call bell/phone within reach;with family/visitor present Nurse Communication: Mobility status PT Visit Diagnosis: Unsteadiness on feet (R26.81);Muscle weakness (generalized) (M62.81);Difficulty in walking, not elsewhere classified (R26.2);History of falling (Z91.81)    Time: 7096-2836 PT Time Calculation (min) (ACUTE ONLY): 45 min   Charges:   PT Evaluation $PT Eval Moderate Complexity: 1 Mod PT Treatments $Gait Training: 8-22 mins       Ramond Dial 03/04/2021, 1:48 PM Mee Hives, PT MS Acute Rehab Dept. Number: [place] and [place]

## 2021-03-04 NOTE — ED Notes (Signed)
Pt husband states he changed his mind and want wife to be admitted, this RN notified MD

## 2021-03-04 NOTE — Progress Notes (Signed)
[facility] PLAN OF CARE NOTE Patient: Connie Chase IEP:[id number]   PCP: Jonathon Jordan, MD DOB: [birth year]   DOA: 03/03/2021   DOS: 03/04/2021    Patient was admitted by my colleague earlier on 03/04/2021. I have reviewed the H&P as well as assessment and plan and agree with the same. Important changes in the plan are listed below.  Plan of care: Principal Problem:   Hemopneumothorax on left Active Problems:   Persistent atrial fibrillation (HCC)   Multiple rib fractures   Fall at home, initial encounter   Chronic anticoagulation A. fib with RVR. 1 dose of IV Cardizem. Cardizem 30 mg 3 times daily.  Home dose twice daily. Patient is not on Tikosyn verified and confirmed.  Chronic anticoagulation with recurrent fall. Discussed with family and the patient.  Informed them to discuss with primary cardiologist as patient remains at high 100% risk for fall and now currently in the hospital with hemopneumothorax. Recommended discontinue anticoagulation can consider low-dose aspirin.  Recurrent fall. Extensive work-up outpatient with neurology at ALS/mobility clinic and [facility] including an EMG. Due to ongoing complain we will consult neurology here.  Appreciate assistance.  Husband requested medical records.  Recommended him to access it via MyChart or officially request medical records from medical records office.  Severe constipation. Initiate bowel regimen.  Sternal fracture. Appears subacute versus chronic. We will get an echocardiogram to ensure there is no trauma to pericardium/myocardium.  Author: Berle Mull, MD [facility] 03/04/2021 9:04 PM   If 7PM-7AM, please contact night-coverage at [email or web address]

## 2021-03-04 NOTE — Progress Notes (Signed)
Subjective: No complaints.  States her rib fractures aren't really hurting her and she isn't taking any pain meds.  denies SOB.  Hasn't been out of bed since admission yet.  Waiting on breakfast.  ROS: See above, otherwise other systems negative  Objective: Vital signs in last 24 hours: Temp:  [98.1 F (36.7 C)-100.3 F (37.9 C)] 98.6 F (37 C) (04/25 0913) Pulse Rate:  [86-110] 106 (04/25 0913) Resp:  [13-20] 16 (04/25 0913) BP: (85-116)/(59-83) 116/78 (04/25 0913) SpO2:  [95 %-97 %] 97 % (04/25 0913) Weight:  [38.8 kg] 38.8 kg (04/25 0325) Last BM Date: 03/03/21  Intake/Output from previous day: No intake/output data recorded. Intake/Output this shift: No intake/output data recorded.  PE: Gen: NAD HEENT: PERRL Heart: regular Lungs: CTAB, but decrease breath sounds at left base.  Some left chest wall tenderness posteriorly Abd: soft, NT, ND Ext: MAE Psych: A&Ox2  Lab Results:  Recent Labs    03/03/21 1629 03/04/21 0532  WBC 6.2 4.1  HGB 12.6 12.5  HCT 36.9 37.5  PLT 225 227   BMET Recent Labs    03/03/21 2034 03/03/21 2055 03/04/21 0532  NA 140  --  144  K 4.0  --  3.4*  CL 108  --  110  CO2 23  --  28  GLUCOSE 122*  --  118*  BUN 13  --  8  CREATININE 0.44 0.40* 0.43*  CALCIUM 8.6*  --  8.3*   PT/INR No results for input(s): LABPROT, INR in the last 72 hours. CMP     Component Value Date/Time   NA 144 03/04/2021 0532   NA 143 10/15/2020 1459   K 3.4 (L) 03/04/2021 0532   CL 110 03/04/2021 0532   CO2 28 03/04/2021 0532   GLUCOSE 118 (H) 03/04/2021 0532   BUN 8 03/04/2021 0532   BUN 14 10/15/2020 1459   CREATININE 0.43 (L) 03/04/2021 0532   CREATININE 0.53 (L) 10/29/2016 1032   CALCIUM 8.3 (L) 03/04/2021 0532   PROT 5.6 (L) 03/03/2021 2034   PROT 6.9 10/15/2020 1459   ALBUMIN 2.9 (L) 03/03/2021 2034   ALBUMIN 4.3 10/15/2020 1459   AST 32 03/03/2021 2034   ALT 24 03/03/2021 2034   ALKPHOS 42 03/03/2021 2034   BILITOT 0.6  03/03/2021 2034   BILITOT <0.2 10/15/2020 1459   GFRNONAA >60 03/04/2021 0532   GFRNONAA >89 01/08/2012 1209   GFRAA 109 10/15/2020 1459   GFRAA >89 01/08/2012 1209   Lipase  No results found for: LIPASE     Studies/Results: DG Chest 2 View  Result Date: 03/03/2021 CLINICAL DATA:  Weakness.  Fall.  Fever. EXAM: CHEST - 2 VIEW COMPARISON:  Most recent chest radiograph 01/22/2010 FINDINGS: Patient is rotated. Left pleural effusion is at least moderate in size. Small left apical pneumothorax, visualized between the posterior second and third rib. Right lung is clear. Heart size grossly normal. Bones are diffusely under mineralized. Mildly displaced fracture of left lateral sixth rib. IMPRESSION: 1. Small left apical pneumothorax and at least moderate left pleural effusion. 2. Mildly displaced left lateral sixth rib fracture. These results were called by telephone at the time of interpretation on 03/03/2021 at 5:25 pm to provider [facility] , who verbally acknowledged these results. Electronically Signed   By: Keith Rake M.D.   On: 03/03/2021 17:25   CT Head Wo Contrast  Result Date: 03/03/2021 CLINICAL DATA:  Fall with head trauma and bleeding. EXAM: CT HEAD  WITHOUT CONTRAST TECHNIQUE: Contiguous axial images were obtained from the base of the skull through the vertex without intravenous contrast. COMPARISON:  Report from a brain MR dated 07/06/2018. FINDINGS: Brain: No evidence of acute infarction, hemorrhage, hydrocephalus, extra-axial collection or mass lesion/mass effect. There is mild cerebral volume loss with associated ex vacuo dilatation. Periventricular white matter hypoattenuation likely represents chronic small vessel ischemic disease. Encephalomalacia is seen in the right frontal parasagittal region. Vascular: There are vascular calcifications in the carotid siphons. Skull: Normal. Negative for fracture or focal lesion. Sinuses/Orbits: There is chronic sinusitis of the left  sphenoid sinus. Other: Scalp soft tissue swelling is seen overlying the right parietal bone. IMPRESSION: 1. No acute intracranial process. Electronically Signed   By: Zerita Boers M.D.   On: 03/03/2021 17:35   CT CHEST ABDOMEN PELVIS W CONTRAST  Result Date: 03/03/2021 CLINICAL DATA:  Rib fracture with traumatic hemopneumothorax. Fall 2 days ago with muscle weakness. EXAM: CT CHEST, ABDOMEN, AND PELVIS WITH CONTRAST TECHNIQUE: Multidetector CT imaging of the chest, abdomen and pelvis was performed following the standard protocol during bolus administration of intravenous contrast. CONTRAST:  149mL OMNIPAQUE IOHEXOL 300 MG/ML  SOLN COMPARISON:  Chest radiograph earlier today. FINDINGS: CT CHEST FINDINGS Cardiovascular: No acute aortic or vascular injury. Mild aortic atherosclerosis. Upper normal heart size. There are coronary artery calcifications. No pericardial effusion. Mediastinum/Nodes: No mediastinal hemorrhage or hematoma. No pneumomediastinum. Heterogeneous thyroid gland without dominant nodule by CT. Patulous esophagus. Lungs/Pleura: Hemo pneumothorax, with moderate fluid component and small pneumothorax component. Pneumothorax component is less than 10%, greatest at the apex. No active extravasation within hemothorax. There is associated compressive atelectasis in the left lower lobe. Lingular consolidation is also likely atelectasis. No right pneumothorax. Trachea and central bronchi are patent. Musculoskeletal: Left lateral rib fractures 5 through 10, many of which are mildly displaced and comminuted. Tenth rib fracture may be subacute. The seventh rib fracture is segmental. No right rib fracture. Buckling of the lower sternal body with sclerosis suggestive of subacute or chronic sternal body fracture. No acute fracture of the thoracic spine. No acute fracture of the included clavicles or shoulder girdles. There is left chest wall soft tissue edema as well as intramuscular edema of the intercostal  cell. No subcutaneous gas. CT ABDOMEN PELVIS FINDINGS Hepatobiliary: No hepatic injury or perihepatic hematoma. Tiny cyst in the right hepatic dome. Mild hepatic steatosis. Prominent common bile duct measuring 8 mm. No visualized choledocholithiasis. Gallbladder is unremarkable. Pancreas: No evidence of injury. No ductal dilatation or inflammation. Spleen: No splenic injury or perisplenic hematoma. Tiny low-density splenic lesions are too small to characterize, likely cysts. Adrenals/Urinary Tract: No adrenal hemorrhage or renal injury identified. Punctate right adrenal calcification likely sequela of prior hemorrhage. Small left renal cyst. Bladder is unremarkable. Stomach/Bowel: No obvious bowel injury, wall thickening or inflammation. Decompressed stomach. Large volume of stool throughout the colon. There is fecalization of distal small bowel contents. Normal appendix. Vascular/Lymphatic: No acute vascular injury. Aortic atherosclerosis and tortuosity. Retroperitoneal fluid. No bulky adenopathy. Reproductive: Retroverted uterus which is mildly bulky and contains scattered calcifications, suggestive of fibroids. No visualized adnexal mass. Other: No intra-abdominal free air or free fluid. Musculoskeletal: No fracture of the lumbar spine or pelvis. Scoliosis and degenerative change throughout the lumbar spine. IMPRESSION: 1. Left lateral rib fractures 5 through 10, many of which are mildly displaced and comminuted. The seventh rib fracture is segmental. Tenth rib fracture may be subacute. 2. Hemo pneumothorax with moderate fluid component and small pneumothorax component.  Pneumothorax component is less than 10%, greatest at the apex. No active extravasation. 3. Buckling of the lower sternal body with sclerosis suggestive of subacute or chronic sternal body fracture. 4. No evidence of acute traumatic injury to the abdomen or pelvis. 5. Large volume of stool throughout the colon with fecalization of distal small  bowel contents, suggesting constipation. 6. Prominent common bile duct measuring 8 mm, no visualized choledocholithiasis. Recommend correlation with LFTs. If LFTs are elevated or there is clinical concern for biliary pathology, consider further evaluation with MRCP, on an elective basis after resolution of acute event. Aortic Atherosclerosis (ICD10-I70.0). Electronically Signed   By: Keith Rake M.D.   On: 03/03/2021 21:41   DG Chest Port 1 View  Result Date: 03/04/2021 CLINICAL DATA:  Recent hemopneumothorax EXAM: PORTABLE CHEST 1 VIEW COMPARISON:  March 03, 2021 chest radiograph and chest CT. FINDINGS: Persistent small right apical pneumothorax that tension component. Pleural fluid on the left again noted. Left hemidiaphragm borders not appreciable by radiography. Multiple displaced rib fractures noted on the left. There is left base atelectasis. The right lung is clear. Heart size and pulmonary vascularity are normal. No adenopathy. There is aortic atherosclerosis. IMPRESSION: Persistent small left apical pneumothorax without tension component. Left pleural fluid, perhaps slightly less than 1 day prior with left base atelectasis. Left hemidiaphragm borders not appreciable on this exam. Note that injury to the left hemidiaphragm cannot be excluded by this examination. Multiple rib fractures on the left noted. Right lung clear. Stable cardiac silhouette. Aortic Atherosclerosis (ICD10-I70.0). Electronically Signed   By: Lowella Grip III M.D.   On: 03/04/2021 08:53    Anti-infectives: Anti-infectives (From admission, onward)   None       Assessment/Plan Fall L rib fxs 5-10 with associated occult apical ptx + small/mod hemothorax on L- multimodal pain control, repeat cxr in am, O2 sats stable.  Repeat CXR in am.  PT/OT for mobilization Multiple recent falls - PT/OT; ?neurology eval for this history to ensure meds optimized. Would also address whether she is candidate for long-term  anticoagulation for afib with this history A fib - per medicine, ? Risk vs benefit of anticoagulation Hep B/cirrhosis - per medicine Parkinson's disease - home meds ordered FEN - regular diet VTE - eliquis on hold ID - none   LOS: 0 days    Henreitta Cea , [facility] Surgery 03/04/2021, 11:03 AM Please see Amion for pager number during day hours 7:00am-4:30pm or 7:00am -11:30am on weekends

## 2021-03-04 NOTE — Progress Notes (Signed)
   03/04/21 2117  Assess: MEWS Score  Temp 98.4 F (36.9 C)  BP (!) 145/78  Pulse Rate (!) 140  Resp 18  Level of Consciousness Alert  SpO2 94 %  O2 Device Room Air  Assess: MEWS Score  MEWS Temp 0  MEWS Systolic 0  MEWS Pulse 3  MEWS RR 0  MEWS LOC 0  MEWS Score 3  MEWS Score Color Yellow  Assess: if the MEWS score is Yellow or Red  Were vital signs taken at a resting state? Yes  Focused Assessment No change from prior assessment  Early Detection of Sepsis Score *See Row Information* Low  MEWS guidelines implemented *See Row Information* Yes  Treat  MEWS Interventions Administered scheduled meds/treatments  Pain Scale 0-10  Pain Score 0  Take Vital Signs  Increase Vital Sign Frequency  Yellow: Q 2hr X 2 then Q 4hr X 2, if remains yellow, continue Q 4hrs  Escalate  MEWS: Escalate Yellow: discuss with charge nurse/RN and consider discussing with provider and RRT  Notify: Charge Nurse/RN  Name of Charge Nurse/RN Notified Eric,RN  Date Charge Nurse/RN Notified 03/04/21  Time Charge Nurse/RN Notified 2117  Notify: Provider  Provider Name/Title G. Shalhoub,MD  Date Provider Notified 03/04/21  Time Provider Notified 2117  Notification Type Page  Notification Reason Critical result  Provider response See new orders;Other (Comment)  Date of Provider Response 03/04/21  Time of Provider Response 2138  Document  Patient Outcome Transferred/level of care increased  Progress note created (see row info) Yes  Pt notified MD. Awaiting orders.  Orders received.

## 2021-03-05 ENCOUNTER — Inpatient Hospital Stay (HOSPITAL_COMMUNITY): Payer: Medicare Other

## 2021-03-05 DIAGNOSIS — S2242XA Multiple fractures of ribs, left side, initial encounter for closed fracture: Secondary | ICD-10-CM | POA: Diagnosis not present

## 2021-03-05 DIAGNOSIS — J942 Hemothorax: Secondary | ICD-10-CM | POA: Diagnosis not present

## 2021-03-05 DIAGNOSIS — I4891 Unspecified atrial fibrillation: Secondary | ICD-10-CM

## 2021-03-05 DIAGNOSIS — E43 Unspecified severe protein-calorie malnutrition: Secondary | ICD-10-CM | POA: Insufficient documentation

## 2021-03-05 LAB — ECHOCARDIOGRAM COMPLETE
AR max vel: 1.3 cm2
AV Area VTI: 1.05 cm2
AV Area mean vel: 1.21 cm2
AV Mean grad: 3 mmHg
AV Peak grad: 6.3 mmHg
Ao pk vel: 1.25 m/s
Area-P 1/2: 3.65 cm2
Height: 62.5 in
S' Lateral: 1.6 cm
Single Plane A4C EF: 58.9 %
Weight: 1368 oz

## 2021-03-05 MED ORDER — ADULT MULTIVITAMIN W/MINERALS CH
1.0000 | ORAL_TABLET | Freq: Every day | ORAL | Status: DC
  Administered 2021-03-06 – 2021-03-13 (×7): 1 via ORAL
  Filled 2021-03-05 (×8): qty 1

## 2021-03-05 MED ORDER — DILTIAZEM HCL ER COATED BEADS 120 MG PO CP24
120.0000 mg | ORAL_CAPSULE | Freq: Every day | ORAL | Status: DC
  Administered 2021-03-05 – 2021-03-06 (×2): 120 mg via ORAL
  Filled 2021-03-05 (×2): qty 1

## 2021-03-05 MED ORDER — POTASSIUM CHLORIDE CRYS ER 20 MEQ PO TBCR
40.0000 meq | EXTENDED_RELEASE_TABLET | ORAL | Status: AC
  Administered 2021-03-05 (×2): 40 meq via ORAL
  Filled 2021-03-05 (×2): qty 2

## 2021-03-05 MED ORDER — ENSURE ENLIVE PO LIQD: 237.0000 mL | Freq: Two times a day (BID) | ORAL | Status: DC

## 2021-03-05 MED ORDER — DILTIAZEM HCL 30 MG PO TABS
30.0000 mg | ORAL_TABLET | Freq: Once | ORAL | Status: AC
  Administered 2021-03-05: 30 mg via ORAL
  Filled 2021-03-05: qty 1

## 2021-03-05 MED ORDER — DILTIAZEM HCL 30 MG PO TABS
30.0000 mg | ORAL_TABLET | Freq: Two times a day (BID) | ORAL | Status: DC
  Administered 2021-03-05 – 2021-03-13 (×17): 30 mg via ORAL
  Filled 2021-03-05 (×18): qty 1

## 2021-03-05 NOTE — Progress Notes (Signed)
[facility] Progress Note  Patient: Connie Chase    [id number]  DOA: 03/03/2021     Date of Service: the patient was seen and examined on 03/05/2021  Brief hospital course: Past medical history of chronic A. 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  Presents with complaints of recurrent fall and found to have right apical pneumothorax, left hemopneumothorax.  Also has A. fib with RVR and severe constipation. Currently plan is continue current therapy.  Assessment and Plan: 1.  Recurrent fall. Patient reports gait disorder with recurrent fall, she tells that her legs give out. She has extensive evaluation including EMG done at [facility]. Neurology was consulted for further assistance CT C-spine unremarkable for any acute abnormality. Currently no further inpatient work-up, outpatient follow-up recommended.  2.  Right small apical pneumothorax. Left hemopneumothorax Left rib fractures, acute, multiple other chronic rib fractures. Appreciate trauma surgery assistance. Chest x-ray shows no evidence of pneumothorax but still shows effusion and patient will be undergoing IR guided paracentesis.  Will monitor response.  3.  Chronic A. fib with RVR Patient is on 30 mg Cardizem breakfast and lunch and 120 mg Cardizem at bedtime. She was supposed to be on Tikosyn but since she identified that she remained in A. fib despite being on Tikosyn she stopped taking the medicine on her own somewhere in the beginning of 2022. Continues to take anticoagulation at home. Overnight had RVR episode was transferred to progressive care unit. Never was started on IV Cardizem infusion. Currently rate controlled with home regimen.  Monitor. Will not reinitiate Tikosyn.  4.  Chronic anticoagulation with recurrent fall Patient has multiple falls and remains at risk for bleeding. Patient is on therapeutic anticoagulation  with Eliquis for her A. fib. At present the risk of stroke is significantly less then her risk for bleeding from a fall. Recommend to discontinue anticoagulation indefinitely. Patient can consider low-dose aspirin. Recommend her to discuss with her primary cardiologist.  5.  Severe constipation Seen on x-ray. Continue with IV fluids and bowel regimen.  6.  Sternal fracture.  Chronic appearing RVR. X-ray shows subacute versus chronic appearing lower xiphoid fracture as well. Patient does not have any symptoms of chest pain there. Echocardiogram reassuring, normal EF, no pericardial effusion.  7.  Hypokalemia. Replacing.  8.  Underweight/cachexia. Dietary consulted. Currently on regular diet.  Also on MVI.  Monitor. Body mass index is 15.39 kg/m.  Nutrition Problem: Severe Malnutrition Etiology: chronic illness (cirrhosis) Interventions: Interventions: MVI,Liberalize Diet,Refer to RD note for recommendations  9.  Right scalp injury. From her prior fall but it actually had mild oozing during her current fall as well. No further work-up recommended as there is no acute bleeding or swelling or acute abnormality on the MRI brain with and without contrast.  10.  Dilated CBD. Incidentally seen on the CT scan.  Currently no evidence of acute abdominal pain or symptoms. Also LFTs are stable. No further work-up.  11.  Goals of care conversation. Patient has recurrent fall and has multiple comorbidities and appears to be severely cachectic. In the setting of cardiac arrest patient will not have good outcome. Recommended patient and husband to consider among themselves discussion about her goals of care.  Diet: Regular diet DVT Prophylaxis:   Place and maintain sequential compression device Start: 03/04/21 1431    Advance goals of care discussion: Full code  Family Communication: no family was present at bedside, at the  time of interview.   Disposition:  Status is:  Inpatient  Remains inpatient appropriate because:Ongoing active pain requiring inpatient pain management and Ongoing diagnostic testing needed not appropriate for outpatient work up   Dispo: The patient is from: Home              Anticipated d/c is to: SNF              Patient currently is not medically stable to d/c.   Difficult to place patient No  Subjective: No nausea no vomiting.  Overnight heart rate was elevated.  No chest pain.  No shortness of breath.  No diarrhea.  Continues to have constipation.  Physical Exam:  General: Appear in mild distress, no Rash; Oral Mucosa Clear, dry, poor dentition. no Abnormal Neck Mass Or lumps, Conjunctiva normal  Cardiovascular: S1 and S2 Present, no Murmur, Respiratory: good respiratory effort, Bilateral Air entry present and CTA, bilateral Crackles, no wheezes Abdomen: Bowel Sound present, Soft and no tenderness Extremities: no Pedal edema Neurology: alert and oriented to time, place, and person affect appropriate. no new focal deficit Gait not checked due to patient safety concerns  Vitals:   03/05/21 0734 03/05/21 0900 03/05/21 1143 03/05/21 1400  BP: 117/69 95/63 98/63  126/69  Pulse: 96 98 97 (!) 107  Resp: 20 20 16 12   Temp: 98 F (36.7 C)  98.3 F (36.8 C)   TempSrc: Oral  Oral   SpO2: 96% 96% 96% 97%  Weight:      Height:        Intake/Output Summary (Last 24 hours) at 03/05/2021 1908 Last data filed at 03/05/2021 1016 Gross per 24 hour  Intake 240 ml  Output 850 ml  Net -610 ml   Filed Weights   03/04/21 0325  Weight: 38.8 kg    Data Reviewed: I have personally reviewed and interpreted daily labs, tele strips, imaging. I reviewed all nursing notes, pharmacy notes, vitals, pertinent old records I have discussed plan of care as described above with RN and patient/family.  CBC: Recent Labs  Lab 03/03/21 1629 03/04/21 0532  WBC 6.2 4.1  NEUTROABS 4.9  --   HGB 12.6 12.5  HCT 36.9 37.5  MCV 102.8* 103.9*  PLT  225 [id number]   Basic Metabolic Panel: Recent Labs  Lab 03/03/21 2034 03/03/21 2055 03/04/21 0532  NA 140  --  144  K 4.0  --  3.4*  CL 108  --  110  CO2 23  --  28  GLUCOSE 122*  --  118*  BUN 13  --  8  CREATININE 0.44 0.40* 0.43*  CALCIUM 8.6*  --  8.3*    Studies: MR BRAIN W WO CONTRAST  Result Date: 03/04/2021 CLINICAL DATA:  Initial evaluation for slowly progressive weakness in all 4 extremities for several years with recent acceleration, left greater than right. EXAM: MRI HEAD WITHOUT AND WITH CONTRAST MRI CERVICAL SPINE WITHOUT AND WITH CONTRAST TECHNIQUE: Multiplanar, multiecho pulse sequences of the brain and surrounding structures, and cervical spine, to include the craniocervical junction and cervicothoracic junction, were obtained without and with intravenous contrast. CONTRAST:  3.53mL GADAVIST GADOBUTROL 1 MMOL/ML IV SOLN COMPARISON:  Comparison made with previous MRI from 07/06/2018. FINDINGS: MRI HEAD FINDINGS Brain: Mild diffuse prominence of the CSF containing spaces compatible generalized age-related cerebral atrophy, similar to previous. Patchy T2/FLAIR hyperintensity within the periventricular white matter and pons, most consistent with chronic small vessel ischemic disease, mild in nature, but mildly progressed as compared  to 2019. Area of encephalomalacia and gliosis at the parasagittal anterior right frontal lobe again seen, which could be related to prior ischemia or possibly trauma, stable. No abnormal foci of restricted diffusion to suggest acute or subacute ischemia. Gray-white matter differentiation otherwise maintained. No other areas of encephalomalacia to suggest chronic cortical infarction. No acute intracranial hemorrhage. Few small chronic micro hemorrhages noted within the subcortical right parietal region, likely small vessel related. No mass lesion, midline shift or mass effect. No hydrocephalus or extra-axial fluid collection. Pituitary gland suprasellar region  within normal limits. Midline structures intact. No abnormal enhancement. Vascular: Major intracranial vascular flow voids are maintained. Skull and upper cervical spine: Craniocervical junction within normal limits. Bone marrow signal intensity normal. No focal marrow replacing lesion. Soft tissue contusion present at the right parietal scalp. 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 Globes and orbital soft tissues demonstrate no acute finding. Paranasal sinuses are clear. No significant mastoid effusion. Inner ear structures grossly normal. Other: None. MRI CERVICAL SPINE FINDINGS Alignment: Underlying mild levoscoliosis. Reversal of the normal cervical lordosis with apex at C3-4. 2 mm anterolisthesis of C2 on C3, with 2 mm retrolisthesis of C4 on C5. Findings chronic and facet mediated. Vertebrae: Vertebral body height maintained without acute or chronic fracture. Bone marrow signal intensity within normal limits. No discrete or worrisome osseous lesions. No abnormal marrow edema or enhancement. Cord: Normal signal and morphology.  No abnormal enhancement. Posterior Fossa, vertebral arteries, paraspinal tissues: Craniocervical junction within normal limits. Paraspinous and prevertebral soft tissues are normal. Normal flow voids seen within the vertebral arteries bilaterally. Large left pleural effusion partially visualized. Disc levels: C2-C3: 2 mm anterolisthesis. Broad posterior disc osteophyte flattens and partially effaces the ventral thecal sac. Left greater than right facet hypertrophy. No significant spinal stenosis. Mild left C3 foraminal narrowing. Right neural foramina remains patent. Appearance is stable. C3-C4: Degenerative intervertebral disc space narrowing with diffuse disc osteophyte complex. Broad posterior component indents and partially effaces the ventral thecal sac. Mild flattening of the ventral cord without cord signal changes.  Mild spinal stenosis. Moderate bilateral C4 foraminal stenosis, slightly worse on the left. Appearance is mildly progressed from previous. C4-C5: 2 mm retrolisthesis. Degenerative intervertebral disc space narrowing with diffuse disc osteophyte complex. Broad posterior component flattens and partially effaces the ventral thecal sac, asymmetric to the right. Mild ligament flavum hypertrophy. Resultant mild spinal stenosis. Mild cord flattening without cord signal changes. Moderate left with severe right C5 foraminal stenosis. Appearance is mildly progressed from previous. C5-C6: Degenerative intervertebral disc space narrowing with diffuse disc osteophyte complex. Broad posterior component flattens and partially effaces the ventral thecal sac. Superimposed mild right worse than left facet hypertrophy. Resultant mild spinal stenosis with minimal cord flattening. Severe right worse than left C6 foraminal stenosis. Appearance is mildly progressed from previous. C6-C7: Degenerative intervertebral disc space narrowing with diffuse disc osteophyte complex. Broad posterior component indents and partially faces the ventral thecal sac, asymmetric to the left. Superimposed central to left subarticular disc extrusion with inferior migration (series 9, image 25). Right worse than left facet degeneration. Resultant mild spinal stenosis with mild cord flattening, but no cord signal changes. Severe bilateral C7 foraminal stenosis. Appearance is mildly progressed from previous. C7-T1: Negative interspace. Moderate right worse than left facet hypertrophy. No significant spinal stenosis. Foramina remain patent. Visualized upper thoracic spine demonstrates no significant finding. IMPRESSION: MRI HEAD IMPRESSION: 1. No acute intracranial abnormality. 2. Chronic encephalomalacia and gliosis involving the parasagittal anterior  right frontal lobe, which could be related to prior infarct or traumatic injury. 3. Mild age-related cerebral  atrophy with chronic microvascular ischemic disease, mildly progressed from previous. 4. Right parietal scalp contusion. MRI CERVICAL SPINE IMPRESSION: 1. No acute abnormality within the cervical spine. 2. Moderate multilevel cervical spondylosis with resultant mild diffuse spinal stenosis at C3-4 through C6-7, overall mildly progressed as compared to 2019. 3. Multifactorial degenerative changes with resultant multilevel foraminal narrowing as above. Notable findings include moderate bilateral C4 foraminal stenosis, moderate left with severe right C5 foraminal narrowing, with severe bilateral C6 and C7 foraminal stenosis. 4. Layering left pleural effusion, partially visualized. Electronically Signed   By: Jeannine Boga M.D.   On: 03/04/2021 20:39   MR CERVICAL SPINE W WO CONTRAST  Result Date: 03/04/2021 CLINICAL DATA:  Initial evaluation for slowly progressive weakness in all 4 extremities for several years with recent acceleration, left greater than right. EXAM: MRI HEAD WITHOUT AND WITH CONTRAST MRI CERVICAL SPINE WITHOUT AND WITH CONTRAST TECHNIQUE: Multiplanar, multiecho pulse sequences of the brain and surrounding structures, and cervical spine, to include the craniocervical junction and cervicothoracic junction, were obtained without and with intravenous contrast. CONTRAST:  3.64mL GADAVIST GADOBUTROL 1 MMOL/ML IV SOLN COMPARISON:  Comparison made with previous MRI from 07/06/2018. FINDINGS: MRI HEAD FINDINGS Brain: Mild diffuse prominence of the CSF containing spaces compatible generalized age-related cerebral atrophy, similar to previous. Patchy T2/FLAIR hyperintensity within the periventricular white matter and pons, most consistent with chronic small vessel ischemic disease, mild in nature, but mildly progressed as compared to 2019. Area of encephalomalacia and gliosis at the parasagittal anterior right frontal lobe again seen, which could be related to prior ischemia or possibly trauma, stable.  No abnormal foci of restricted diffusion to suggest acute or subacute ischemia. Gray-white matter differentiation otherwise maintained. No other areas of encephalomalacia to suggest chronic cortical infarction. No acute intracranial hemorrhage. Few small chronic micro hemorrhages noted within the subcortical right parietal region, likely small vessel related. No mass lesion, midline shift or mass effect. No hydrocephalus or extra-axial fluid collection. Pituitary gland suprasellar region within normal limits. Midline structures intact. No abnormal enhancement. Vascular: Major intracranial vascular flow voids are maintained. Skull and upper cervical spine: Craniocervical junction within normal limits. Bone marrow signal intensity normal. No focal marrow replacing lesion. Soft tissue contusion present at the right parietal scalp. 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 Globes and orbital soft tissues demonstrate no acute finding. Paranasal sinuses are clear. No significant mastoid effusion. Inner ear structures grossly normal. Other: None. MRI CERVICAL SPINE FINDINGS Alignment: Underlying mild levoscoliosis. Reversal of the normal cervical lordosis with apex at C3-4. 2 mm anterolisthesis of C2 on C3, with 2 mm retrolisthesis of C4 on C5. Findings chronic and facet mediated. Vertebrae: Vertebral body height maintained without acute or chronic fracture. Bone marrow signal intensity within normal limits. No discrete or worrisome osseous lesions. No abnormal marrow edema or enhancement. Cord: Normal signal and morphology.  No abnormal enhancement. Posterior Fossa, vertebral arteries, paraspinal tissues: Craniocervical junction within normal limits. Paraspinous and prevertebral soft tissues are normal. Normal flow voids seen within the vertebral arteries bilaterally. Large left pleural effusion partially visualized. Disc levels: C2-C3: 2 mm anterolisthesis.  Broad posterior disc osteophyte flattens and partially effaces the ventral thecal sac. Left greater than right facet hypertrophy. No significant spinal stenosis. Mild left C3 foraminal narrowing. Right neural foramina remains patent. Appearance is stable. C3-C4: Degenerative intervertebral disc space narrowing with diffuse  disc osteophyte complex. Broad posterior component indents and partially effaces the ventral thecal sac. Mild flattening of the ventral cord without cord signal changes. Mild spinal stenosis. Moderate bilateral C4 foraminal stenosis, slightly worse on the left. Appearance is mildly progressed from previous. C4-C5: 2 mm retrolisthesis. Degenerative intervertebral disc space narrowing with diffuse disc osteophyte complex. Broad posterior component flattens and partially effaces the ventral thecal sac, asymmetric to the right. Mild ligament flavum hypertrophy. Resultant mild spinal stenosis. Mild cord flattening without cord signal changes. Moderate left with severe right C5 foraminal stenosis. Appearance is mildly progressed from previous. C5-C6: Degenerative intervertebral disc space narrowing with diffuse disc osteophyte complex. Broad posterior component flattens and partially effaces the ventral thecal sac. Superimposed mild right worse than left facet hypertrophy. Resultant mild spinal stenosis with minimal cord flattening. Severe right worse than left C6 foraminal stenosis. Appearance is mildly progressed from previous. C6-C7: Degenerative intervertebral disc space narrowing with diffuse disc osteophyte complex. Broad posterior component indents and partially faces the ventral thecal sac, asymmetric to the left. Superimposed central to left subarticular disc extrusion with inferior migration (series 9, image 25). Right worse than left facet degeneration. Resultant mild spinal stenosis with mild cord flattening, but no cord signal changes. Severe bilateral C7 foraminal stenosis. Appearance is  mildly progressed from previous. C7-T1: Negative interspace. Moderate right worse than left facet hypertrophy. No significant spinal stenosis. Foramina remain patent. Visualized upper thoracic spine demonstrates no significant finding. IMPRESSION: MRI HEAD IMPRESSION: 1. No acute intracranial abnormality. 2. Chronic encephalomalacia and gliosis involving the parasagittal anterior right frontal lobe, which could be related to prior infarct or traumatic injury. 3. Mild age-related cerebral atrophy with chronic microvascular ischemic disease, mildly progressed from previous. 4. Right parietal scalp contusion. MRI CERVICAL SPINE IMPRESSION: 1. No acute abnormality within the cervical spine. 2. Moderate multilevel cervical spondylosis with resultant mild diffuse spinal stenosis at C3-4 through C6-7, overall mildly progressed as compared to 2019. 3. Multifactorial degenerative changes with resultant multilevel foraminal narrowing as above. Notable findings include moderate bilateral C4 foraminal stenosis, moderate left with severe right C5 foraminal narrowing, with severe bilateral C6 and C7 foraminal stenosis. 4. Layering left pleural effusion, partially visualized. Electronically Signed   By: Jeannine Boga M.D.   On: 03/04/2021 20:39   DG CHEST PORT 1 VIEW  Result Date: 03/05/2021 CLINICAL DATA:  Left hemo pneumothorax EXAM: PORTABLE CHEST 1 VIEW COMPARISON:  03/04/2021 FINDINGS: Left apical pneumothorax has resolved. Moderate left pleural fluid persists. Multiple acute left rib fractures are again identified. The right lung is clear. No pneumothorax or pleural effusion on the right. Cardiac size is within normal limits. The pulmonary vascularity is normal. IMPRESSION: Resolved left apical pneumothorax. Stable moderate left pleural effusion. Multiple acute left rib fractures again noted. Electronically Signed   By: Fidela Salisbury MD   On: 03/05/2021 06:26   ECHOCARDIOGRAM COMPLETE  Result Date:  03/05/2021    ECHOCARDIOGRAM REPORT   Patient Name:   AIRIEL OBLINGER Date of Exam: 03/05/2021 Medical Rec #:  [id number]   Height:       62.5 in Accession #:    [id number]  Weight:       85.5 lb Date of Birth:  [birth year]   BSA:          1.340 m Patient Age:    12 years    BP:           106/65 mmHg Patient Gender: F  HR:           95 bpm. Exam Location:  Inpatient Procedure: 2D Echo, Cardiac Doppler and Color Doppler Indications:    Tachycardia  History:        Patient has prior history of Echocardiogram examinations, most                 recent 02/16/2015. Arrythmias:Atrial Fibrillation.  Sonographer:    Luisa Hart RDCS Referring Phys: [id number] Josetta Huddle Nyheem Binette  Sonographer Comments: [id number] cardioversions 09/06/2015 AF ablation 09/04/2016 AF ablation IMPRESSIONS  1. Left ventricular ejection fraction, by estimation, is 60 to 65%. The left ventricle has normal function. The left ventricle has no regional wall motion abnormalities. There is mild concentric left ventricular hypertrophy. Left ventricular diastolic function could not be evaluated.  2. Right ventricular systolic function is normal. The right ventricular size is normal. There is mildly elevated pulmonary artery systolic pressure.  3. Large pleural effusion in the left lateral region. Large fibrinous masses/clots are seen in the pleural space.  4. The mitral valve is normal in structure. No evidence of mitral valve regurgitation.  5. Tricuspid valve regurgitation is mild to moderate.  6. The aortic valve is normal in structure. Aortic valve regurgitation is not visualized. Mild aortic valve sclerosis is present, with no evidence of aortic valve stenosis.  7. The inferior vena cava is dilated in size with <50% respiratory variability, suggesting right atrial pressure of 15 mmHg. FINDINGS  Left Ventricle: Left ventricular ejection fraction, by estimation, is 60 to 65%. The left ventricle has normal function. The left ventricle has no regional wall motion  abnormalities. The left ventricular internal cavity size was normal in size. There is  mild concentric left ventricular hypertrophy. Left ventricular diastolic function could not be evaluated due to atrial fibrillation. Left ventricular diastolic function could not be evaluated. Right Ventricle: The right ventricular size is normal. No increase in right ventricular wall thickness. Right ventricular systolic function is normal. There is mildly elevated pulmonary artery systolic pressure. The tricuspid regurgitant velocity is 2.62  m/s, and with an assumed right atrial pressure of 15 mmHg, the estimated right ventricular systolic pressure is 96.2 mmHg. Left Atrium: Left atrial size was normal in size. Right Atrium: Right atrial size was normal in size. Pericardium: There is no evidence of pericardial effusion. Mitral Valve: The mitral valve is normal in structure. There is mild thickening of the mitral valve leaflet(s). Mild mitral annular calcification. No evidence of mitral valve regurgitation. Tricuspid Valve: The tricuspid valve is normal in structure. Tricuspid valve regurgitation is mild to moderate. Aortic Valve: The aortic valve is normal in structure. Aortic valve regurgitation is not visualized. Mild aortic valve sclerosis is present, with no evidence of aortic valve stenosis. Aortic valve mean gradient measures 3.0 mmHg. Aortic valve peak gradient measures 6.2 mmHg. Aortic valve area, by VTI measures 1.05 cm. Pulmonic Valve: The pulmonic valve was normal in structure. Pulmonic valve regurgitation is not visualized. Aorta: The aortic root is normal in size and structure. Venous: The inferior vena cava is dilated in size with less than 50% respiratory variability, suggesting right atrial pressure of 15 mmHg. IAS/Shunts: No atrial level shunt detected by color flow Doppler. Additional Comments: There is a large pleural effusion in the left lateral region.  LEFT VENTRICLE PLAX 2D LVIDd:         3.40 cm LVIDs:          1.60 cm LV PW:  1.30 cm LV IVS:        1.30 cm LVOT diam:     1.70 cm LV SV:         22 LV SV Index:   17 LVOT Area:     2.27 cm  LV Volumes (MOD) LV vol d, MOD A2C: 26.0 ml LV vol d, MOD A4C: 33.1 ml LV vol s, MOD A4C: 13.6 ml LV SV MOD A4C:     33.1 ml RIGHT VENTRICLE RV S prime:     11.40 cm/s TAPSE (M-mode): 1.7 cm LEFT ATRIUM           Index       RIGHT ATRIUM           Index LA diam:      3.10 cm 2.31 cm/m  RA Area:     12.40 cm LA Vol (A2C): 17.9 ml 13.36 ml/m RA Volume:   27.00 ml  20.15 ml/m LA Vol (A4C): 23.0 ml 17.16 ml/m  AORTIC VALVE                   PULMONIC VALVE AV Area (Vmax):    1.30 cm    PV Vmax:       0.81 m/s AV Area (Vmean):   1.21 cm    PV Vmean:      51.400 cm/s AV Area (VTI):     1.05 cm    PV VTI:        0.127 m AV Vmax:           125.00 cm/s PV Peak grad:  2.6 mmHg AV Vmean:          83.300 cm/s PV Mean grad:  1.0 mmHg AV VTI:            0.214 m AV Peak Grad:      6.2 mmHg AV Mean Grad:      3.0 mmHg LVOT Vmax:         71.80 cm/s LVOT Vmean:        44.300 cm/s LVOT VTI:          0.099 m LVOT/AV VTI ratio: 0.46  AORTA Ao Root diam: 3.00 cm MITRAL VALVE               TRICUSPID VALVE MV Area (PHT): 3.65 cm    TR Peak grad:   27.5 mmHg MV Decel Time: 208 msec    TR Vmax:        262.00 cm/s MV E velocity: 78.00 cm/s                            SHUNTS                            Systemic VTI:  0.10 m                            Systemic Diam: 1.70 cm Dani Gobble Croitoru MD Electronically signed by Sanda Klein MD Signature Date/Time: 03/05/2021/12:40:10 PM    Final     Scheduled Meds: . diltiazem  120 mg Oral QHS  . diltiazem  30 mg Oral BID  . docusate sodium  100 mg Oral BID  . dorzolamide  1 drop Both Eyes BID  . entecavir  1 mg Oral q AM  . multivitamin with minerals  1 tablet Oral  Daily  . polyethylene glycol  17 g Oral Daily   Continuous Infusions: . lactated ringers 100 mL/hr at 03/04/21 1246   PRN Meds: acetaminophen **OR** acetaminophen, HYDROmorphone  (DILAUDID) injection, ondansetron **OR** ondansetron (ZOFRAN) IV, senna-docusate, traMADol  Time spent: 35 minutes  Author: Berle Mull, MD [facility] 03/05/2021 7:08 PM  To reach On-call, see care teams to locate the attending and reach out via [email or web address]. Between 7PM-7AM, please contact night-coverage If you still have difficulty reaching the attending provider, please page the [facility] (Director on Call) for [facility] on amion for assistance.

## 2021-03-05 NOTE — Progress Notes (Signed)
Initial Nutrition Assessment  DOCUMENTATION CODES:   Severe malnutrition in context of chronic illness  INTERVENTION:  -Recommend liberalizing diet to regular -MVI with minerals daily -d/c Ensure as pt refusing all supplements   NUTRITION DIAGNOSIS:   Severe Malnutrition related to chronic illness (cirrhosis) as evidenced by severe muscle depletion,severe fat depletion.  GOAL:   Patient will meet greater than or equal to 90% of their needs  MONITOR:   PO intake,Labs,Weight trends,I & O's  REASON FOR ASSESSMENT:   Consult,Malnutrition Screening Tool Assessment of nutrition requirement/status  ASSESSMENT:   Pt with PMH significant for Afib, cirrhosis, chronic viral hepatitis B without delta-agent, and osteoporosis presented for evaluation of L chest wall pain with shortness of breath with exertion. Pt had experienced multiple falls over the last 4-5 days PTA 2/2 progressive weakness and difficulty with ambulation. Pt found to have L rib fractures 5-10 and hemopneumothorax.  CXR this morning with no further PTX, but still with effusion. Pt to have thoracentesis.   Pt denies any N/V and any changes in appetite. Pt states that she eats 3 meals per day at baseline -- B: a breakfast sandwich containing items such as vegetables, humus, avocado (no egg), L/D: broccoli, brussels sprouts, lentils, fish. Pt also has one Glucerna shake each night. RD inquired about ordering Glucerna and other oral nutrition supplements for pt, but pt states that she will not use these products as she feels she is getting enough with her 3 meals per day right now. Attempted to educate on kcal/protein needs but pt insists her po intake is adequate. Pt only agreeable to mvi with minerals at this time. Will continue to recommend use of oral nutrition supplements and will continue to attempt education on importance of adequate nutrition.   Note plan for SNF at discharge.  PO Intake: 85-100% x 2 recorded meals    Note Ensure Enlive BID was ordered today and pt has declined supplement per RN. Will discontinue order and recommend diet liberalization to optimize po intake.   Reviewed weight history. No weight obtained since 09/15/2018 to use for comparison. Note pt is underweight, BMI 15.39.   UOP: 881ml x24 hours  Medications: colace, miralax IVF: LR @ 176ml/hr Labs: K+ 3.4 (L)  NUTRITION - FOCUSED PHYSICAL EXAM:  Flowsheet Row Most Recent Value  Orbital Region Severe depletion  Upper Arm Region Severe depletion  Thoracic and Lumbar Region Severe depletion  Buccal Region Unable to assess  [pt wearing mask]  Temple Region Severe depletion  Clavicle Bone Region Severe depletion  Clavicle and Acromion Bone Region Severe depletion  Scapular Bone Region Severe depletion  Dorsal Hand Severe depletion  Patellar Region Severe depletion  Anterior Thigh Region Severe depletion  Posterior Calf Region Severe depletion  Edema (RD Assessment) None  Hair Other (Comment)  [thinning]  Eyes Reviewed  Mouth Unable to assess  [pt wearing mask]  Skin Reviewed  Nails Reviewed       Diet Order:   Diet Order            Diet Heart Room service appropriate? Yes; Fluid consistency: Thin  Diet effective now                 EDUCATION NEEDS:   No education needs have been identified at this time  Skin:  Skin Assessment: Reviewed RN Assessment  Last BM:  4/24  Height:   Ht Readings from Last 1 Encounters:  03/04/21 5' 2.5" (1.588 m)    Weight:   Wt  Readings from Last 1 Encounters:  03/04/21 38.8 kg   BMI:  Body mass index is 15.39 kg/m.  Estimated Nutritional Needs:  Kcal:  1350-1550 Protein:  65-80g Fluid:  >1.35L    Larkin Ina, MS, RD, LDN RD pager number and weekend/on-call pager number located in [place].

## 2021-03-05 NOTE — Progress Notes (Signed)
NEUROLOGY CONSULTATION PROGRESS NOTE   Date of service: March 05, 2021 Patient Name: Shanita Kanan MRN:  [id number] DOB:  [birth year]  Brief HPI  Su-Hua Sahota is a 75 y.o. 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 who presents for evaluation ofleft chest wall pain with shortness of breath with exertion. Patient has had multiple falls over the last 4 to 5 days. She states that 2 nights ago she fell very hard landing on her left side on the hardwood floor of her kitchen. She was found to have Left rib fractures 5-10 and hemopneumothorax.   Interval Hx   She reports that she is doing ok today. Discussed that her MRI Brain revealed no acute intracranial abnormality and that her MRI C Spine showed no acute abnormality but with mild progression of stenosis and degenerative changes compared to 2019 imaging. Discussed that the next steps would be to continue workup on an outpatient basis.  Vitals   Vitals:   03/04/21 2248 03/05/21 0102 03/05/21 0300 03/05/21 0734  BP: 119/73 106/65  117/69  Pulse: (!) 124 (!) 115  96  Resp: 18   20  Temp: 98.5 F (36.9 C)  98.4 F (36.9 C) 98 F (36.7 C)  TempSrc: Oral  Oral Oral  SpO2: 96%   96%  Weight:      Height:         Body mass index is 15.39 kg/m.  Physical Exam   General: Laying comfortably in bed; in no acute distress.  HENT: Normal oropharynx and mucosa. Normal external appearance of ears and nose.  Neck: Supple, no pain or tenderness  CV: No JVD. No peripheral edema.  Pulmonary: Symmetric Chest rise. Normal respiratory effort.  Abdomen: Soft to touch, non-tender.  Ext: No cyanosis, edema, or deformity  Skin: No rash. Normal  palpation of skin.   Musculoskeletal: Normal digits and nails by inspection. No clubbing.   Neurologic Examination  Mental status/Cognition: Alert, oriented to self, place, month and year, good attention.  Speech/language: Fluent, comprehension intact, object naming intact, repetition intact.  Cranial nerves:   CN II Pupils equal and reactive to light, no VF deficits    CN III,IV,VI EOM intact, Left sided amblyopia present    CN V normal sensation in V1, V2, and V3 segments bilaterally    CN VII Has Left sided facial droop and bilateral proptosis present   CN VIII normal hearing to speech    CN IX & X normal palatal elevation, no uvular deviation    CN XI 5/5 head turn and 5/5 shoulder shrug bilaterally    CN XII midline tongue protrusion    Motor:  Able to move all extremities against gravity Has generalized weakness with Left > Right Decreased grip strength with Left > Right Muscle bulk: decreased, tone mildly increased, no pronator drift or tremor present   Sensation: Light touch: Intact bilaterally   Labs   Basic Metabolic Panel:  Lab Results  Component Value Date   NA 144 03/04/2021   K 3.4 (L) 03/04/2021   CO2 28 03/04/2021   GLUCOSE 118 (H) 03/04/2021   BUN 8 03/04/2021   CREATININE 0.43 (L) 03/04/2021   CALCIUM 8.3 (L) 03/04/2021   GFRNONAA >60 03/04/2021   GFRAA 109 10/15/2020  HbA1c: No results found for: HGBA1C LDL: No results found for: [facility] Urine Drug Screen: No results found for: LABOPIA, COCAINSCRNUR, LABBENZ, AMPHETMU, THCU, LABBARB  Alcohol Level No results found for: ETH No results found for: PHENYTOIN, ZONISAMIDE, LAMOTRIGINE, LEVETIRACETA No results found for: PHENYTOIN, PHENOBARB, VALPROATE, CBMZ  Imaging and Diagnostic studies  Results for orders placed during the hospital encounter of 03/03/21  CT Head Wo Contrast  Narrative CLINICAL DATA:  Fall with head trauma and bleeding.  EXAM: CT HEAD WITHOUT CONTRAST  TECHNIQUE: Contiguous  axial images were obtained from the base of the skull through the vertex without intravenous contrast.  COMPARISON:  Report from a brain MR dated 07/06/2018.  FINDINGS: Brain: No evidence of acute infarction, hemorrhage, hydrocephalus, extra-axial collection or mass lesion/mass effect. There is mild cerebral volume loss with associated ex vacuo dilatation. Periventricular white matter hypoattenuation likely represents chronic small vessel ischemic disease. Encephalomalacia is seen in the right frontal parasagittal region.  Vascular: There are vascular calcifications in the carotid siphons.  Skull: Normal. Negative for fracture or focal lesion.  Sinuses/Orbits: There is chronic sinusitis of the left sphenoid sinus.  Other: Scalp soft tissue swelling is seen overlying the right parietal bone.  IMPRESSION: 1. No acute intracranial process.   Electronically Signed By: Zerita Boers M.D. On: 03/03/2021 17:35   MRI Brain W/WO Contrast: No acute intracranial abnormality. Chronic encephalomalacia and gliosis involving the parasagittal anterior right frontal lobe, which could be related to prior infarct or traumatic injury. Mild age-related cerebral atrophy with chronic microvascular ischemic disease, mildly progressed from previous. Right parietal scalp contusion.   MRI C Spine W/WO Contrast: No acute abnormality within the cervical spine. Moderate multilevel cervical spondylosis with resultant mild diffuse spinal stenosis at C3-4 through C6-7, overall mildly progressed as compared to 2019. Multifactorial degenerative changes with resultant multilevel foraminal narrowing as above. Notable findings include moderate bilateral C4 foraminal stenosis, moderate left with severe right C5 foraminal narrowing, with severe bilateral C6 and C7 foraminal stenosis. Layering left pleural effusion, partially visualized.   Impression   Izzy Klute is a 75 y.o. female with PMH significant for atrial  fibrillation on eliquis, Osteoporosis. Her neurologic examination is notable for continued significant muscular weakness with Left > Right. As there are no acute processes at present recommend continuing workup outpatient.   Recommendations  -Continue outpatient workup  consider repeat EMG Consider new trial of Sinemet ______________________________________________________________________   Fatima Sanger MD Resident

## 2021-03-05 NOTE — Progress Notes (Signed)
Subjective: Some pain in her chest but minimal.  Not taking narcotics for this.  Worked with PT yesterday and recommended SNF.  Went into a fib rvr overnight.  Started on cardizem  ROS: See above, otherwise other systems negative  Objective: Vital signs in last 24 hours: Temp:  [98 F (36.7 C)-98.7 F (37.1 C)] 98 F (36.7 C) (04/26 0734) Pulse Rate:  [92-140] 96 (04/26 0734) Resp:  [16-20] 20 (04/26 0734) BP: (106-145)/(65-83) 117/69 (04/26 0734) SpO2:  [94 %-97 %] 96 % (04/26 0734) Last BM Date: 03/03/21  Intake/Output from previous day: 04/25 0701 - 04/26 0700 In: 1155.4 [P.O.:480; I.V.:675.4] Out: 350 [Urine:350] Intake/Output this shift: No intake/output data recorded.  PE: Gen: NAD HEENT: PERRL Heart: rate controlled in 90s Lungs: CTAB, but decrease breath sounds at left base.  Some left chest wall tenderness posteriorly Abd: soft, NT, ND Ext: MAE Psych: A&Ox2  Lab Results:  Recent Labs    03/03/21 1629 03/04/21 0532  WBC 6.2 4.1  HGB 12.6 12.5  HCT 36.9 37.5  PLT 225 227   BMET Recent Labs    03/03/21 2034 03/03/21 2055 03/04/21 0532  NA 140  --  144  K 4.0  --  3.4*  CL 108  --  110  CO2 23  --  28  GLUCOSE 122*  --  118*  BUN 13  --  8  CREATININE 0.44 0.40* 0.43*  CALCIUM 8.6*  --  8.3*   PT/INR No results for input(s): LABPROT, INR in the last 72 hours. CMP     Component Value Date/Time   NA 144 03/04/2021 0532   NA 143 10/15/2020 1459   K 3.4 (L) 03/04/2021 0532   CL 110 03/04/2021 0532   CO2 28 03/04/2021 0532   GLUCOSE 118 (H) 03/04/2021 0532   BUN 8 03/04/2021 0532   BUN 14 10/15/2020 1459   CREATININE 0.43 (L) 03/04/2021 0532   CREATININE 0.53 (L) 10/29/2016 1032   CALCIUM 8.3 (L) 03/04/2021 0532   PROT 5.6 (L) 03/03/2021 2034   PROT 6.9 10/15/2020 1459   ALBUMIN 2.9 (L) 03/03/2021 2034   ALBUMIN 4.3 10/15/2020 1459   AST 32 03/03/2021 2034   ALT 24 03/03/2021 2034   ALKPHOS 42 03/03/2021 2034   BILITOT 0.6  03/03/2021 2034   BILITOT <0.2 10/15/2020 1459   GFRNONAA >60 03/04/2021 0532   GFRNONAA >89 01/08/2012 1209   GFRAA 109 10/15/2020 1459   GFRAA >89 01/08/2012 1209   Lipase  No results found for: LIPASE     Studies/Results: DG Chest 2 View  Result Date: 03/03/2021 CLINICAL DATA:  Weakness.  Fall.  Fever. EXAM: CHEST - 2 VIEW COMPARISON:  Most recent chest radiograph 01/22/2010 FINDINGS: Patient is rotated. Left pleural effusion is at least moderate in size. Small left apical pneumothorax, visualized between the posterior second and third rib. Right lung is clear. Heart size grossly normal. Bones are diffusely under mineralized. Mildly displaced fracture of left lateral sixth rib. IMPRESSION: 1. Small left apical pneumothorax and at least moderate left pleural effusion. 2. Mildly displaced left lateral sixth rib fracture. These results were called by telephone at the time of interpretation on 03/03/2021 at 5:25 pm to provider [facility] , who verbally acknowledged these results. Electronically Signed   By: Keith Rake M.D.   On: 03/03/2021 17:25   CT Head Wo Contrast  Result Date: 03/03/2021 CLINICAL DATA:  Fall with head trauma and bleeding. EXAM: CT  HEAD WITHOUT CONTRAST TECHNIQUE: Contiguous axial images were obtained from the base of the skull through the vertex without intravenous contrast. COMPARISON:  Report from a brain MR dated 07/06/2018. FINDINGS: Brain: No evidence of acute infarction, hemorrhage, hydrocephalus, extra-axial collection or mass lesion/mass effect. There is mild cerebral volume loss with associated ex vacuo dilatation. Periventricular white matter hypoattenuation likely represents chronic small vessel ischemic disease. Encephalomalacia is seen in the right frontal parasagittal region. Vascular: There are vascular calcifications in the carotid siphons. Skull: Normal. Negative for fracture or focal lesion. Sinuses/Orbits: There is chronic sinusitis of the left  sphenoid sinus. Other: Scalp soft tissue swelling is seen overlying the right parietal bone. IMPRESSION: 1. No acute intracranial process. Electronically Signed   By: Zerita Boers M.D.   On: 03/03/2021 17:35   MR BRAIN W WO CONTRAST  Result Date: 03/04/2021 CLINICAL DATA:  Initial evaluation for slowly progressive weakness in all 4 extremities for several years with recent acceleration, left greater than right. EXAM: MRI HEAD WITHOUT AND WITH CONTRAST MRI CERVICAL SPINE WITHOUT AND WITH CONTRAST TECHNIQUE: Multiplanar, multiecho pulse sequences of the brain and surrounding structures, and cervical spine, to include the craniocervical junction and cervicothoracic junction, were obtained without and with intravenous contrast. CONTRAST:  3.27mL GADAVIST GADOBUTROL 1 MMOL/ML IV SOLN COMPARISON:  Comparison made with previous MRI from 07/06/2018. FINDINGS: MRI HEAD FINDINGS Brain: Mild diffuse prominence of the CSF containing spaces compatible generalized age-related cerebral atrophy, similar to previous. Patchy T2/FLAIR hyperintensity within the periventricular white matter and pons, most consistent with chronic small vessel ischemic disease, mild in nature, but mildly progressed as compared to 2019. Area of encephalomalacia and gliosis at the parasagittal anterior right frontal lobe again seen, which could be related to prior ischemia or possibly trauma, stable. No abnormal foci of restricted diffusion to suggest acute or subacute ischemia. Gray-white matter differentiation otherwise maintained. No other areas of encephalomalacia to suggest chronic cortical infarction. No acute intracranial hemorrhage. Few small chronic micro hemorrhages noted within the subcortical right parietal region, likely small vessel related. No mass lesion, midline shift or mass effect. No hydrocephalus or extra-axial fluid collection. Pituitary gland suprasellar region within normal limits. Midline structures intact. No abnormal  enhancement. Vascular: Major intracranial vascular flow voids are maintained. Skull and upper cervical spine: Craniocervical junction within normal limits. Bone marrow signal intensity normal. No focal marrow replacing lesion. Soft tissue contusion present at the right parietal scalp. 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 Globes and orbital soft tissues demonstrate no acute finding. Paranasal sinuses are clear. No significant mastoid effusion. Inner ear structures grossly normal. Other: None. MRI CERVICAL SPINE FINDINGS Alignment: Underlying mild levoscoliosis. Reversal of the normal cervical lordosis with apex at C3-4. 2 mm anterolisthesis of C2 on C3, with 2 mm retrolisthesis of C4 on C5. Findings chronic and facet mediated. Vertebrae: Vertebral body height maintained without acute or chronic fracture. Bone marrow signal intensity within normal limits. No discrete or worrisome osseous lesions. No abnormal marrow edema or enhancement. Cord: Normal signal and morphology.  No abnormal enhancement. Posterior Fossa, vertebral arteries, paraspinal tissues: Craniocervical junction within normal limits. Paraspinous and prevertebral soft tissues are normal. Normal flow voids seen within the vertebral arteries bilaterally. Large left pleural effusion partially visualized. Disc levels: C2-C3: 2 mm anterolisthesis. Broad posterior disc osteophyte flattens and partially effaces the ventral thecal sac. Left greater than right facet hypertrophy. No significant spinal stenosis. Mild left C3 foraminal narrowing. Right neural foramina remains patent.  Appearance is stable. C3-C4: Degenerative intervertebral disc space narrowing with diffuse disc osteophyte complex. Broad posterior component indents and partially effaces the ventral thecal sac. Mild flattening of the ventral cord without cord signal changes. Mild spinal stenosis. Moderate bilateral C4 foraminal stenosis,  slightly worse on the left. Appearance is mildly progressed from previous. C4-C5: 2 mm retrolisthesis. Degenerative intervertebral disc space narrowing with diffuse disc osteophyte complex. Broad posterior component flattens and partially effaces the ventral thecal sac, asymmetric to the right. Mild ligament flavum hypertrophy. Resultant mild spinal stenosis. Mild cord flattening without cord signal changes. Moderate left with severe right C5 foraminal stenosis. Appearance is mildly progressed from previous. C5-C6: Degenerative intervertebral disc space narrowing with diffuse disc osteophyte complex. Broad posterior component flattens and partially effaces the ventral thecal sac. Superimposed mild right worse than left facet hypertrophy. Resultant mild spinal stenosis with minimal cord flattening. Severe right worse than left C6 foraminal stenosis. Appearance is mildly progressed from previous. C6-C7: Degenerative intervertebral disc space narrowing with diffuse disc osteophyte complex. Broad posterior component indents and partially faces the ventral thecal sac, asymmetric to the left. Superimposed central to left subarticular disc extrusion with inferior migration (series 9, image 25). Right worse than left facet degeneration. Resultant mild spinal stenosis with mild cord flattening, but no cord signal changes. Severe bilateral C7 foraminal stenosis. Appearance is mildly progressed from previous. C7-T1: Negative interspace. Moderate right worse than left facet hypertrophy. No significant spinal stenosis. Foramina remain patent. Visualized upper thoracic spine demonstrates no significant finding. IMPRESSION: MRI HEAD IMPRESSION: 1. No acute intracranial abnormality. 2. Chronic encephalomalacia and gliosis involving the parasagittal anterior right frontal lobe, which could be related to prior infarct or traumatic injury. 3. Mild age-related cerebral atrophy with chronic microvascular ischemic disease, mildly  progressed from previous. 4. Right parietal scalp contusion. MRI CERVICAL SPINE IMPRESSION: 1. No acute abnormality within the cervical spine. 2. Moderate multilevel cervical spondylosis with resultant mild diffuse spinal stenosis at C3-4 through C6-7, overall mildly progressed as compared to 2019. 3. Multifactorial degenerative changes with resultant multilevel foraminal narrowing as above. Notable findings include moderate bilateral C4 foraminal stenosis, moderate left with severe right C5 foraminal narrowing, with severe bilateral C6 and C7 foraminal stenosis. 4. Layering left pleural effusion, partially visualized. Electronically Signed   By: Rise Mu M.D.   On: 03/04/2021 20:39   MR CERVICAL SPINE W WO CONTRAST  Result Date: 03/04/2021 CLINICAL DATA:  Initial evaluation for slowly progressive weakness in all 4 extremities for several years with recent acceleration, left greater than right. EXAM: MRI HEAD WITHOUT AND WITH CONTRAST MRI CERVICAL SPINE WITHOUT AND WITH CONTRAST TECHNIQUE: Multiplanar, multiecho pulse sequences of the brain and surrounding structures, and cervical spine, to include the craniocervical junction and cervicothoracic junction, were obtained without and with intravenous contrast. CONTRAST:  3.6mL GADAVIST GADOBUTROL 1 MMOL/ML IV SOLN COMPARISON:  Comparison made with previous MRI from 07/06/2018. FINDINGS: MRI HEAD FINDINGS Brain: Mild diffuse prominence of the CSF containing spaces compatible generalized age-related cerebral atrophy, similar to previous. Patchy T2/FLAIR hyperintensity within the periventricular white matter and pons, most consistent with chronic small vessel ischemic disease, mild in nature, but mildly progressed as compared to 2019. Area of encephalomalacia and gliosis at the parasagittal anterior right frontal lobe again seen, which could be related to prior ischemia or possibly trauma, stable. No abnormal foci of restricted diffusion to suggest acute  or subacute ischemia. Gray-white matter differentiation otherwise maintained. No other areas of encephalomalacia to suggest chronic cortical infarction. No  acute intracranial hemorrhage. Few small chronic micro hemorrhages noted within the subcortical right parietal region, likely small vessel related. No mass lesion, midline shift or mass effect. No hydrocephalus or extra-axial fluid collection. Pituitary gland suprasellar region within normal limits. Midline structures intact. No abnormal enhancement. Vascular: Major intracranial vascular flow voids are maintained. Skull and upper cervical spine: Craniocervical junction within normal limits. Bone marrow signal intensity normal. No focal marrow replacing lesion. Soft tissue contusion present at the right parietal scalp. 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 Globes and orbital soft tissues demonstrate no acute finding. Paranasal sinuses are clear. No significant mastoid effusion. Inner ear structures grossly normal. Other: None. MRI CERVICAL SPINE FINDINGS Alignment: Underlying mild levoscoliosis. Reversal of the normal cervical lordosis with apex at C3-4. 2 mm anterolisthesis of C2 on C3, with 2 mm retrolisthesis of C4 on C5. Findings chronic and facet mediated. Vertebrae: Vertebral body height maintained without acute or chronic fracture. Bone marrow signal intensity within normal limits. No discrete or worrisome osseous lesions. No abnormal marrow edema or enhancement. Cord: Normal signal and morphology.  No abnormal enhancement. Posterior Fossa, vertebral arteries, paraspinal tissues: Craniocervical junction within normal limits. Paraspinous and prevertebral soft tissues are normal. Normal flow voids seen within the vertebral arteries bilaterally. Large left pleural effusion partially visualized. Disc levels: C2-C3: 2 mm anterolisthesis. Broad posterior disc osteophyte flattens and partially effaces  the ventral thecal sac. Left greater than right facet hypertrophy. No significant spinal stenosis. Mild left C3 foraminal narrowing. Right neural foramina remains patent. Appearance is stable. C3-C4: Degenerative intervertebral disc space narrowing with diffuse disc osteophyte complex. Broad posterior component indents and partially effaces the ventral thecal sac. Mild flattening of the ventral cord without cord signal changes. Mild spinal stenosis. Moderate bilateral C4 foraminal stenosis, slightly worse on the left. Appearance is mildly progressed from previous. C4-C5: 2 mm retrolisthesis. Degenerative intervertebral disc space narrowing with diffuse disc osteophyte complex. Broad posterior component flattens and partially effaces the ventral thecal sac, asymmetric to the right. Mild ligament flavum hypertrophy. Resultant mild spinal stenosis. Mild cord flattening without cord signal changes. Moderate left with severe right C5 foraminal stenosis. Appearance is mildly progressed from previous. C5-C6: Degenerative intervertebral disc space narrowing with diffuse disc osteophyte complex. Broad posterior component flattens and partially effaces the ventral thecal sac. Superimposed mild right worse than left facet hypertrophy. Resultant mild spinal stenosis with minimal cord flattening. Severe right worse than left C6 foraminal stenosis. Appearance is mildly progressed from previous. C6-C7: Degenerative intervertebral disc space narrowing with diffuse disc osteophyte complex. Broad posterior component indents and partially faces the ventral thecal sac, asymmetric to the left. Superimposed central to left subarticular disc extrusion with inferior migration (series 9, image 25). Right worse than left facet degeneration. Resultant mild spinal stenosis with mild cord flattening, but no cord signal changes. Severe bilateral C7 foraminal stenosis. Appearance is mildly progressed from previous. C7-T1: Negative interspace.  Moderate right worse than left facet hypertrophy. No significant spinal stenosis. Foramina remain patent. Visualized upper thoracic spine demonstrates no significant finding. IMPRESSION: MRI HEAD IMPRESSION: 1. No acute intracranial abnormality. 2. Chronic encephalomalacia and gliosis involving the parasagittal anterior right frontal lobe, which could be related to prior infarct or traumatic injury. 3. Mild age-related cerebral atrophy with chronic microvascular ischemic disease, mildly progressed from previous. 4. Right parietal scalp contusion. MRI CERVICAL SPINE IMPRESSION: 1. No acute abnormality within the cervical spine. 2. Moderate multilevel cervical spondylosis with resultant mild diffuse spinal stenosis  at C3-4 through C6-7, overall mildly progressed as compared to 2019. 3. Multifactorial degenerative changes with resultant multilevel foraminal narrowing as above. Notable findings include moderate bilateral C4 foraminal stenosis, moderate left with severe right C5 foraminal narrowing, with severe bilateral C6 and C7 foraminal stenosis. 4. Layering left pleural effusion, partially visualized. Electronically Signed   By: Jeannine Boga M.D.   On: 03/04/2021 20:39   CT CHEST ABDOMEN PELVIS W CONTRAST  Result Date: 03/03/2021 CLINICAL DATA:  Rib fracture with traumatic hemopneumothorax. Fall 2 days ago with muscle weakness. EXAM: CT CHEST, ABDOMEN, AND PELVIS WITH CONTRAST TECHNIQUE: Multidetector CT imaging of the chest, abdomen and pelvis was performed following the standard protocol during bolus administration of intravenous contrast. CONTRAST:  159mL OMNIPAQUE IOHEXOL 300 MG/ML  SOLN COMPARISON:  Chest radiograph earlier today. FINDINGS: CT CHEST FINDINGS Cardiovascular: No acute aortic or vascular injury. Mild aortic atherosclerosis. Upper normal heart size. There are coronary artery calcifications. No pericardial effusion. Mediastinum/Nodes: No mediastinal hemorrhage or hematoma. No  pneumomediastinum. Heterogeneous thyroid gland without dominant nodule by CT. Patulous esophagus. Lungs/Pleura: Hemo pneumothorax, with moderate fluid component and small pneumothorax component. Pneumothorax component is less than 10%, greatest at the apex. No active extravasation within hemothorax. There is associated compressive atelectasis in the left lower lobe. Lingular consolidation is also likely atelectasis. No right pneumothorax. Trachea and central bronchi are patent. Musculoskeletal: Left lateral rib fractures 5 through 10, many of which are mildly displaced and comminuted. Tenth rib fracture may be subacute. The seventh rib fracture is segmental. No right rib fracture. Buckling of the lower sternal body with sclerosis suggestive of subacute or chronic sternal body fracture. No acute fracture of the thoracic spine. No acute fracture of the included clavicles or shoulder girdles. There is left chest wall soft tissue edema as well as intramuscular edema of the intercostal cell. No subcutaneous gas. CT ABDOMEN PELVIS FINDINGS Hepatobiliary: No hepatic injury or perihepatic hematoma. Tiny cyst in the right hepatic dome. Mild hepatic steatosis. Prominent common bile duct measuring 8 mm. No visualized choledocholithiasis. Gallbladder is unremarkable. Pancreas: No evidence of injury. No ductal dilatation or inflammation. Spleen: No splenic injury or perisplenic hematoma. Tiny low-density splenic lesions are too small to characterize, likely cysts. Adrenals/Urinary Tract: No adrenal hemorrhage or renal injury identified. Punctate right adrenal calcification likely sequela of prior hemorrhage. Small left renal cyst. Bladder is unremarkable. Stomach/Bowel: No obvious bowel injury, wall thickening or inflammation. Decompressed stomach. Large volume of stool throughout the colon. There is fecalization of distal small bowel contents. Normal appendix. Vascular/Lymphatic: No acute vascular injury. Aortic atherosclerosis  and tortuosity. Retroperitoneal fluid. No bulky adenopathy. Reproductive: Retroverted uterus which is mildly bulky and contains scattered calcifications, suggestive of fibroids. No visualized adnexal mass. Other: No intra-abdominal free air or free fluid. Musculoskeletal: No fracture of the lumbar spine or pelvis. Scoliosis and degenerative change throughout the lumbar spine. IMPRESSION: 1. Left lateral rib fractures 5 through 10, many of which are mildly displaced and comminuted. The seventh rib fracture is segmental. Tenth rib fracture may be subacute. 2. Hemo pneumothorax with moderate fluid component and small pneumothorax component. Pneumothorax component is less than 10%, greatest at the apex. No active extravasation. 3. Buckling of the lower sternal body with sclerosis suggestive of subacute or chronic sternal body fracture. 4. No evidence of acute traumatic injury to the abdomen or pelvis. 5. Large volume of stool throughout the colon with fecalization of distal small bowel contents, suggesting constipation. 6. Prominent common bile duct measuring 8 mm,  no visualized choledocholithiasis. Recommend correlation with LFTs. If LFTs are elevated or there is clinical concern for biliary pathology, consider further evaluation with MRCP, on an elective basis after resolution of acute event. Aortic Atherosclerosis (ICD10-I70.0). Electronically Signed   By: Keith Rake M.D.   On: 03/03/2021 21:41   DG CHEST PORT 1 VIEW  Result Date: 03/05/2021 CLINICAL DATA:  Left hemo pneumothorax EXAM: PORTABLE CHEST 1 VIEW COMPARISON:  03/04/2021 FINDINGS: Left apical pneumothorax has resolved. Moderate left pleural fluid persists. Multiple acute left rib fractures are again identified. The right lung is clear. No pneumothorax or pleural effusion on the right. Cardiac size is within normal limits. The pulmonary vascularity is normal. IMPRESSION: Resolved left apical pneumothorax. Stable moderate left pleural effusion.  Multiple acute left rib fractures again noted. Electronically Signed   By: Fidela Salisbury MD   On: 03/05/2021 06:26   DG Chest Port 1 View  Result Date: 03/04/2021 CLINICAL DATA:  Recent hemopneumothorax EXAM: PORTABLE CHEST 1 VIEW COMPARISON:  March 03, 2021 chest radiograph and chest CT. FINDINGS: Persistent small right apical pneumothorax that tension component. Pleural fluid on the left again noted. Left hemidiaphragm borders not appreciable by radiography. Multiple displaced rib fractures noted on the left. There is left base atelectasis. The right lung is clear. Heart size and pulmonary vascularity are normal. No adenopathy. There is aortic atherosclerosis. IMPRESSION: Persistent small left apical pneumothorax without tension component. Left pleural fluid, perhaps slightly less than 1 day prior with left base atelectasis. Left hemidiaphragm borders not appreciable on this exam. Note that injury to the left hemidiaphragm cannot be excluded by this examination. Multiple rib fractures on the left noted. Right lung clear. Stable cardiac silhouette. Aortic Atherosclerosis (ICD10-I70.0). Electronically Signed   By: Lowella Grip III M.D.   On: 03/04/2021 08:53    Anti-infectives: Anti-infectives (From admission, onward)   Start     Dose/Rate Route Frequency Ordered Stop   03/05/21 0700  entecavir (BARACLUDE) tablet 1 mg        1 mg Oral Every morning 03/04/21 1408         Assessment/Plan Fall L rib fxs 5-10 with associated occult apical ptx + small/mod hemothorax on L- multimodal pain control as needed.  CXR this am with no further PTX, but still with effusion.  Will ask IR for thoracentesis today to evacuate the fluid. Multiple recent falls - PT/OT recommend SNF.  Neuro has seen with MR brain and c-spine ordered and done.  Defer further work up to neuro/primary A fib - per medicine, ? Risk vs benefit of anticoagulation Hep B/cirrhosis - per medicine FEN - regular diet VTE - eliquis on  hold, defer to medicine on resumption of this and as above risks vs benefits given her fall history. ID - none   LOS: 1 day    Connie Chase , [facility] 03/05/2021, 8:04 AM Please see Amion for pager number during day hours 7:00am-4:30pm or 7:00am -11:30am on weekends

## 2021-03-05 NOTE — TOC CAGE-AID Note (Signed)
Transition of Care [facility]) - CAGE-AID Screening   Patient Details  Name: Connie Chase MRN: [id number] Date of Birth: Jun 06, 1946  Transition [facility] Care [facility]) CM/SW Contact:    Clovis Cao, RN Phone Number: [phone] 03/05/2021, 1:21 PM   Clinical Narrative: Pt denies alcohol or drug use.   CAGE-AID Screening:    Have You Ever Felt You Ought to Cut Down on Your Drinking or Drug Use?: No Have People Annoyed You By Critizing Your Drinking Or Drug Use?: No Have You Felt Bad Or Guilty About Your Drinking Or Drug Use?: No Have You Ever Had a Drink or Used Drugs First Thing In The Morning to Steady Your Nerves or to Get Rid of a Hangover?: No CAGE-AID Score: 0  Substance Abuse Education Offered: No

## 2021-03-05 NOTE — Evaluation (Addendum)
Occupational Therapy Evaluation Patient Details Name: Connie Chase MRN: [id number] DOB: 08-06-1946 Today's Date: 03/05/2021    History of Present Illness 75 yo female with onset of falls at home on hard floors was admitted due to SOB, found to have hemopneumothorax and L side rib fractures from 5-10, due to legs giving out.  Has been struggling with gait, having increased difficulty in last few days prior to admission.  PMHx:  viral Hep B, cirrhosis, fibroids, osteoporosis, a-fib, prediabetes, atherosclerosis, cardioversion, ablation, cataract repair, OA   Clinical Impression   Pt PTA: pt living at home with spouse, recent falls and very weak. Pt was independent prior to falls, but slow and increased time required. Pt currently, limited by decreased strength, decreased ability to care for self and decreased activity tolerance. Pt set-upA to maxA for ADL. Pt unsafe with mobility in room and unable to properly use RW for safety and slightly impulsive with modA +2 for lines.  Pt's max HR reached 175 BPM with pt after exertion to BA when sitting on commode of BM. Pt took 2 minutes to <120 BPM. Pt would benefit from continued OT skilled services. OT following acutely. Pt has spouse 24/7, but unable to provide physical assist (recent falls) and children are out of town. Pt is a high fall risk, thus SNF is recommended at this time.    Follow Up Recommendations  SNF    Equipment Recommendations  3 in 1 bedside commode    Recommendations for Other Services       Precautions / Restrictions Precautions Precautions: Fall Precaution Comments: urinary frequency Restrictions Weight Bearing Restrictions: No      Mobility Bed Mobility Overal bed mobility: Needs Assistance Bed Mobility: Supine to Sit     Supine to sit: Mod assist     General bed mobility comments: assisted to side of bed with bed pad  and to scoot to EOB    Transfers Overall transfer level: Needs assistance Equipment used:  Rolling walker (2 wheeled);1 person hand held assist Transfers: Sit to/from Stand Sit to Stand: Mod assist         General transfer comment: Pt reaching for RW for sit to stand and unable to correct for each transfer as pt would start standing without warning.    Balance Overall balance assessment: Needs assistance Sitting-balance support: Feet supported Sitting balance-Leahy Scale: Fair     Standing balance support: Bilateral upper extremity supported;During functional activity Standing balance-Leahy Scale: Poor Standing balance comment: use of RW too far out in front of pt                           ADL either performed or assessed with clinical judgement   ADL Overall ADL's : Needs assistance/impaired Eating/Feeding: Moderate assistance;Bed level Eating/Feeding Details (indicate cue type and reason): "spouse has been feeding me." OTR describes the importance of pt feeding herself. Grooming: Minimal assistance;Sitting;Bed level   Upper Body Bathing: Minimal assistance;Sitting;Bed level   Lower Body Bathing: Maximal assistance;Cueing for safety;Sitting/lateral leans;Sit to/from stand   Upper Body Dressing : Minimal assistance;Sitting   Lower Body Dressing: Minimal assistance;Sitting/lateral leans   Toilet Transfer: Moderate assistance;Stand-pivot;Ambulation;RW Toilet Transfer Details (indicate cue type and reason): +2 assist for lines and difficulty using RW properly as pt pushing it too far forward; pt requiring modA for slower descent. Toileting- Clothing Manipulation and Hygiene: Sitting/lateral lean;Sit to/from stand;Moderate assistance Toileting - Clothing Manipulation Details (indicate cue type and reason): pt standing  at commode; performed own in sitting, but to be cleaned, pt required modA in standing     Functional mobility during ADLs: Moderate assistance;+2 for physical assistance;+2 for safety/equipment;Rolling walker (+2 for lines) General ADL  Comments: Pt limited by decreased strength, decreased ability to care for self and decreased activity tolerance. Pt unsafe with mobility in room and unable to properly use RW for safety. Pt would benefit from continued OT. Pt and spouse agreeable to SNF at this time as pt reports "I cannot go home without falling."     Vision Baseline Vision/History: No visual deficits Patient Visual Report: Other (comment) (L eye swollen from fall) Vision Assessment?: Vision impaired- to be further tested in functional context Additional Comments: Pt's L eye severely adducts when looking to L. Continue to assess. Pt focused on going to [facility] and ordering food after.     Perception     Praxis      Pertinent Vitals/Pain Pain Assessment: No/denies pain     Hand Dominance Right   Extremity/Trunk Assessment Upper Extremity Assessment Upper Extremity Assessment: Generalized weakness   Lower Extremity Assessment Lower Extremity Assessment: Generalized weakness   Cervical / Trunk Assessment Cervical / Trunk Assessment: Kyphotic   Communication Communication Communication: No difficulties   Cognition Arousal/Alertness: Awake/alert Behavior During Therapy: WFL for tasks assessed/performed Overall Cognitive Status: Within Functional Limits for tasks assessed                                 General Comments: attending to instructions but a little impulsive due to fatigue or weakness stating "I do try to listen to you, but I couldn't do what you were asking" when asked to take steps closer to [facility] to reduce need for extra assist to get closer to [facility] once already in bed.   General Comments  Pt encouraged to use BSC with staff instead of purewick. Pt;s max HR reached 175 BPM with pt after exertion to BA when sitting on commode of BM.Pt took 2 minutes to <120 BPM.    Exercises     Shoulder Instructions      Home Living Family/patient expects to be discharged to:: Private residence Living  Arrangements: Spouse/significant other Available Help at Discharge: Family Type of Home: Apartment Home Access: Stairs to enter Technical brewer of Steps: 12 Entrance Stairs-Rails: Right Home Layout: One level         Biochemist, clinical: Standard     Home Equipment: Environmental consultant - 2 wheels          Prior Functioning/Environment Level of Independence: Independent with assistive device(s) (prior to falls)        Comments: prior to falls, but recently, pt requiring increased assist and very weak.        OT Problem List: Decreased strength;Decreased activity tolerance;Impaired balance (sitting and/or standing);Decreased safety awareness;Pain;Cardiopulmonary status limiting activity      OT Treatment/Interventions: Self-care/ADL training;Therapeutic exercise;Energy conservation;Therapeutic activities;Patient/family education;Balance training;Cognitive remediation/compensation;Visual/perceptual remediation/compensation    OT Goals(Current goals can be found in the care plan section) Acute Rehab OT Goals Patient Stated Goal: to walk and get up stairs OT Goal Formulation: With patient Time For Goal Achievement: 03/19/21 Potential to Achieve Goals: Good ADL Goals Pt Will Perform Eating: with set-up;sitting Pt Will Perform Grooming: with min guard assist;standing Pt Will Perform Lower Body Dressing: with min assist;sit to/from stand Pt Will Transfer to Toilet: with min guard assist;ambulating Pt Will Perform Toileting - Clothing  Manipulation and hygiene: sit to/from stand;with min guard assist Pt/caregiver will Perform Home Exercise Program: Increased strength;Both right and left upper extremity;With theraband;With Supervision  OT Frequency: Min 2X/week   Barriers to D/C:            Co-evaluation              AM-PAC OT "6 Clicks" Daily Activity     Outcome Measure Help from another person eating meals?: A Lot Help from another person taking care of personal  grooming?: A Little Help from another person toileting, which includes using toliet, bedpan, or urinal?: A Little Help from another person bathing (including washing, rinsing, drying)?: A Little Help from another person to put on and taking off regular upper body clothing?: A Lot Help from another person to put on and taking off regular lower body clothing?: A Lot 6 Click Score: 15   End of Session Nurse Communication: Mobility status;Other (comment) (pt had BM on commode)  Activity Tolerance: Patient limited by fatigue;Patient limited by pain Patient left: in bed;with call bell/phone within reach;with bed alarm set;with family/visitor present  OT Visit Diagnosis: Unsteadiness on feet (R26.81);Muscle weakness (generalized) (M62.81)                Time: 9924-2683 OT Time Calculation (min): 26 min Charges:  OT General Charges $OT Visit: 1 Visit OT Evaluation $OT Eval Moderate Complexity: 1 Mod OT Treatments $Self Care/Home Management : 8-22 mins  Jefferey Pica, OTR/L Acute Rehabilitation Services Pager: [phone] Office: [phone]   Linh Hedberg C 03/05/2021, 5:16 PM

## 2021-03-05 NOTE — Progress Notes (Signed)
Patient arrived to 2w21. Kept patient on same bed as one from 5w. Vitals show patients HR 105-110. No complaints from patient at this time. Will continue to monitor and assess and care as directed. Physician will be notified for any new/acute changes.

## 2021-03-05 NOTE — Progress Notes (Signed)
*  PRELIMINARY RESULTS* Echocardiogram 2D Echocardiogram has been performed.  Connie Chase 03/05/2021, 9:16 AM

## 2021-03-06 ENCOUNTER — Inpatient Hospital Stay (HOSPITAL_COMMUNITY): Payer: Medicare Other

## 2021-03-06 DIAGNOSIS — S2242XA Multiple fractures of ribs, left side, initial encounter for closed fracture: Secondary | ICD-10-CM | POA: Diagnosis not present

## 2021-03-06 HISTORY — PX: IR THORACENTESIS ASP PLEURAL SPACE W/IMG GUIDE: IMG5380

## 2021-03-06 LAB — COMPREHENSIVE METABOLIC PANEL
ALT: 19 U/L (ref 0–44)
AST: 18 U/L (ref 15–41)
Albumin: 2.9 g/dL — ABNORMAL LOW (ref 3.5–5.0)
Alkaline Phosphatase: 49 U/L (ref 38–126)
Anion gap: 9 (ref 5–15)
BUN: 11 mg/dL (ref 8–23)
CO2: 27 mmol/L (ref 22–32)
Calcium: 9.1 mg/dL (ref 8.9–10.3)
Chloride: 104 mmol/L (ref 98–111)
Creatinine, Ser: 0.52 mg/dL (ref 0.44–1.00)
GFR, Estimated: >60 mL/min (ref >60)
Glucose, Bld: 99 mg/dL (ref 70–99)
Potassium: 4.3 mmol/L (ref 3.5–5.1)
Sodium: 140 mmol/L (ref 135–145)
Total Bilirubin: 0.7 mg/dL (ref 0.3–1.2)
Total Protein: 5.8 g/dL — ABNORMAL LOW (ref 6.5–8.1)

## 2021-03-06 LAB — CBC WITH DIFFERENTIAL/PLATELET
Abs Immature Granulocytes: 0.04 10*3/uL (ref 0.00–0.07)
Basophils Absolute: 0 10*3/uL (ref 0.0–0.1)
Basophils Relative: 1 %
Eosinophils Absolute: 0.1 10*3/uL (ref 0.0–0.5)
Eosinophils Relative: 3 %
HCT: 40.3 % (ref 36.0–46.0)
Hemoglobin: 13.2 g/dL (ref 12.0–15.0)
Immature Granulocytes: 1 %
Lymphocytes Relative: 13 %
Lymphs Abs: 0.7 10*3/uL (ref 0.7–4.0)
MCH: 34.1 pg — ABNORMAL HIGH (ref 26.0–34.0)
MCHC: 32.8 g/dL (ref 30.0–36.0)
MCV: 104.1 fL — ABNORMAL HIGH (ref 80.0–100.0)
Monocytes Absolute: 0.5 10*3/uL (ref 0.1–1.0)
Monocytes Relative: 9 %
Neutro Abs: 3.6 10*3/uL (ref 1.7–7.7)
Neutrophils Relative %: 73 %
Platelets: 288 10*3/uL (ref 150–400)
RBC: 3.87 MIL/uL (ref 3.87–5.11)
RDW: 12.4 % (ref 11.5–15.5)
WBC: 4.9 10*3/uL (ref 4.0–10.5)
nRBC: 0 % (ref 0.0–0.2)

## 2021-03-06 LAB — T4, FREE: Free T4: 0.96 ng/dL (ref 0.61–1.12)

## 2021-03-06 LAB — TSH: TSH: 2.058 u[IU]/mL (ref 0.350–4.500)

## 2021-03-06 LAB — PROTIME-INR
INR: 1 (ref 0.8–1.2)
Prothrombin Time: 12.8 seconds (ref 11.4–15.2)

## 2021-03-06 LAB — MAGNESIUM: Magnesium: 2.2 mg/dL (ref 1.7–2.4)

## 2021-03-06 LAB — VITAMIN B12: Vitamin B-12: 984 pg/mL — ABNORMAL HIGH (ref 180–914)

## 2021-03-06 MED ORDER — LIDOCAINE HCL 1 % IJ SOLN
INTRAMUSCULAR | Status: AC
  Filled 2021-03-06: qty 20

## 2021-03-06 MED ORDER — LIDOCAINE HCL (PF) 1 % IJ SOLN
INTRAMUSCULAR | Status: DC | PRN
  Administered 2021-03-06: 5 mL

## 2021-03-06 NOTE — TOC Progression Note (Signed)
Transition of Care [facility]) - Progression Note    Patient Details  Name: Connie Chase MRN: [id number] Date of Birth: [birth year]  Transition of Care [facility]) CM/SW Contact  Angelita Ingles, RN Phone Number: [phone]  03/06/2021, 3:05 PM  Clinical Narrative:    [facility] consulted for patient possible d/s to SNF. CM at bedside to discuss disposition with patient. Patient is currently out for testing. Nurse reports that patient is planning to discharge home with husband. TOC will continue to follow.         Expected Discharge Plan and Services                                                 Social Determinants of Health (SDOH) Interventions    Readmission Risk Interventions No flowsheet data found.

## 2021-03-06 NOTE — Progress Notes (Signed)
[facility] Progress Note  Patient: Connie Chase    KVQ:[id number]  DOA: 03/03/2021     Date of Service: the patient was seen and examined on 03/06/2021  Brief hospital course: Past medical history of chronic A. 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  Presents with complaints of recurrent fall and found to have right apical pneumothorax, left multiple rib fractures leading to hemopneumothorax.  Also has A. fib with RVR and severe constipation.  Trauma surgery on board but signed off today.  PT OT recommends SNF.  Assessment and Plan: 1.  Recurrent fall. Patient reports gait disorder with recurrent fall, she tells that her legs give out. She has extensive evaluation including EMG done at [facility]. Neurology was consulted for further assistance CT C-spine, MRI brain, CT head unremarkable for any acute abnormality. Currently no further inpatient work-up, outpatient follow-up recommended.  Neurology signed off.  PT recommends SNF.  2.  Right small apical pneumothorax. Left hemopneumothorax Left multiple rib fractures, acute, multiple other chronic rib fractures. Appreciate trauma surgery assistance. Chest x-ray shows no evidence of pneumothorax but still shows effusion and patient is a scheduled to have IR guided thoracentesis on the left today.  Trauma surgery signed off.  Encouraged incentive spirometry.  3.  Chronic A. fib with RVR Patient is on 30 mg Cardizem breakfast and lunch and 120 mg Cardizem CD at bedtime. She was supposed to be on Tikosyn but since she identified that she remained in A. fib despite being on Tikosyn she stopped taking the medicine on her own somewhere in the beginning of 2022. Continues to take anticoagulation at home.  Has been having intermittent episodes of RVR but self controlled.  Continue current Cardizem.  Anticoagulation is stopped due to history of recurrent  falls.  4.  Chronic anticoagulation with recurrent fall Anticoagulation is stopped due to history and risk of recurrent falls.  Will start on a low-dose aspirin once she is more stable.  5.  Severe constipation: She had a bowel movement.  Continue with constipation regimen.  6.  Sternal fracture.  Chronic appearing RVR. X-ray shows subacute versus chronic appearing lower xiphoid fracture as well. Patient does not have any symptoms of chest pain there. Echocardiogram reassuring, normal EF, no pericardial effusion.  7.  Hypokalemia. Resolved  8.  Underweight/cachexia. Dietary consulted. Currently on regular diet.  Also on MVI.  Monitor. Body mass index is 15.39 kg/m.  Nutrition Problem: Severe Malnutrition Etiology: chronic illness (cirrhosis) Interventions: Interventions: MVI,Liberalize Diet,Refer to RD note for recommendations  9.  Right scalp injury. From her prior fall but it actually had mild oozing during her current fall as well. No further work-up recommended as there is no acute bleeding or swelling or acute abnormality on the MRI brain with and without contrast.  10.  Dilated CBD. Incidentally seen on the CT scan.  Currently no evidence of acute abdominal pain or symptoms. Also LFTs are stable. No further work-up.  11.  Goals of care conversation. Patient has recurrent fall and has multiple comorbidities and appears to be severely cachectic. In the setting of cardiac arrest patient will not have good outcome.  Currently she is full code.  Previous hospitalist likely had conversation with them.  I will initiate another conversation tomorrow  Diet: Regular diet DVT Prophylaxis:   Place and maintain sequential compression device Start: 03/04/21 1431    Advance goals of care discussion: Full code  Family Communication:  Husband present at the bedside.  Disposition:  Status is: Inpatient  Remains inpatient appropriate because:Ongoing active pain requiring  inpatient pain management and Ongoing diagnostic testing needed not appropriate for outpatient work up   Dispo: The patient is from: Home              Anticipated d/c is to: SNF              Patient currently is not medically stable to d/c.   Difficult to place patient No  Subjective: Patient seen and examined.  Her husband was at the bedside and feeding her breakfast.  Patient was fully alert and oriented and complained of some left anterior rib cage pain with deep breathing.  No other complaint.  Physical Exam:  General exam: Appears calm and comfortable  Respiratory system: Diminished breath sounds left middle and lower lobe, anterior chest tenderness. Respiratory effort normal. Cardiovascular system: S1 & S2 heard, RRR. No JVD, murmurs, rubs, gallops or clicks. No pedal edema. Gastrointestinal system: Abdomen is nondistended, soft and nontender. No organomegaly or masses felt. Normal bowel sounds heard. Central nervous system: Alert and oriented. No focal neurological deficits. Extremities: Symmetric 5 x 5 power. Skin: No rashes, lesions or ulcers.  Psychiatry: Judgement and insight appear normal. Mood & affect appropriate.   Vitals:   03/05/21 2008 03/06/21 0019 03/06/21 0700 03/06/21 1226  BP: 115/80 135/87 113/72 107/69  Pulse: (!) 112 (!) 106 98 100  Resp: 14 20 14 14   Temp: 98.5 F (36.9 C)   99.4 F (37.4 C)  TempSrc: Oral   Oral  SpO2: 95% 95%  95%  Weight:      Height:        Intake/Output Summary (Last 24 hours) at 03/06/2021 1420 Last data filed at 03/06/2021 0830 Gross per 24 hour  Intake 180 ml  Output 400 ml  Net -220 ml   Filed Weights   03/04/21 0325  Weight: 38.8 kg    Data Reviewed: I have personally reviewed and interpreted daily labs, tele strips, imaging. I reviewed all nursing notes, pharmacy notes, vitals, pertinent old records I have discussed plan of care as described above with RN and patient/family.  CBC: Recent Labs  Lab  03/03/21 1629 03/04/21 0532 03/06/21 0453  WBC 6.2 4.1 4.9  NEUTROABS 4.9  --  3.6  HGB 12.6 12.5 13.2  HCT 36.9 37.5 40.3  MCV 102.8* 103.9* 104.1*  PLT 225 227 376   Basic Metabolic Panel: Recent Labs  Lab 03/03/21 2034 03/03/21 2055 03/04/21 0532 03/06/21 0453  NA 140  --  144 140  K 4.0  --  3.4* 4.3  CL 108  --  110 104  CO2 23  --  28 27  GLUCOSE 122*  --  118* 99  BUN 13  --  8 11  CREATININE 0.44 0.40* 0.43* 0.52  CALCIUM 8.6*  --  8.3* 9.1  MG  --   --   --  2.2    Studies: No results found.  Scheduled Meds: . diltiazem  120 mg Oral QHS  . diltiazem  30 mg Oral BID  . docusate sodium  100 mg Oral BID  . dorzolamide  1 drop Both Eyes BID  . entecavir  1 mg Oral q AM  . multivitamin with minerals  1 tablet Oral Daily  . polyethylene glycol  17 g Oral Daily   Continuous Infusions: . lactated ringers 100 mL/hr at 03/04/21 1246   PRN Meds: acetaminophen **  OR** acetaminophen, HYDROmorphone (DILAUDID) injection, ondansetron **OR** ondansetron (ZOFRAN) IV, senna-docusate, traMADol  Time spent: 36 minutes  Author: Darliss Cheney, MD [facility] 03/06/2021 2:20 PM  To reach On-call, see care teams to locate the attending and reach out via [email or web address]. Between 7PM-7AM, please contact night-coverage If you still have difficulty reaching the attending provider, please page the [facility] (Director on Call) for [facility] on amion for assistance.

## 2021-03-06 NOTE — Progress Notes (Signed)
Patient HR 130's-150's, patient asymptomatic. Pahwani alerted.

## 2021-03-06 NOTE — Procedures (Signed)
PROCEDURE SUMMARY:  Successful US guided left thoracentesis. Yielded 600 mL of thin dark bloody fluid. Pt tolerated procedure well. No immediate complications.  Specimen was not sent for labs. CXR ordered.  EBL < 5 mL  Ascencion Dike PA-C 03/06/2021 3:35 PM

## 2021-03-06 NOTE — Progress Notes (Signed)
HR sustaining 180s at rest. Pt denies CP, SOB, dizziness, or other symptoms. No PRNS available. Pt has received scheduled PO meds per Mar this morning. Dr. Doristine Bosworth paged and made aware.

## 2021-03-06 NOTE — Progress Notes (Addendum)
Physical Therapy Treatment Patient Details Name: Connie Chase MRN: [id number] DOB: 09/05/1946 Today's Date: 03/06/2021    History of Present Illness 75 yo female with onset of falls at home on hard floors was admitted due to SOB on 03/03/21, found to have hemopneumothorax and L side rib fractures from 5-10, due to legs giving out.  Has been struggling with gait, having increased difficulty in last few days prior to admission.  PMHx:  viral Hep B, cirrhosis, fibroids, osteoporosis, a-fib, prediabetes, atherosclerosis, cardioversion, ablation, cataract repair, OA    PT Comments    Pt with limited therapy session today due to elevated HR, fatigue, awaiting thoracentesis, and lunch arrived.  Pt did participate with multiple transfers and took a few steps with assist.  Requires increased time for transfers and rest breaks.  Continue to fatigue very easily.      Follow Up Recommendations  SNF     Equipment Recommendations  3in1 (PT)    Recommendations for Other Services       Precautions / Restrictions Precautions Precautions: Fall    Mobility  Bed Mobility Overal bed mobility: Needs Assistance Bed Mobility: Supine to Sit;Sit to Supine     Supine to sit: Mod assist Sit to supine: Mod assist   General bed mobility comments: Mod A with use of bed pad to scoot to EOB; mod A for legs back to bed; increased time and cues provided    Transfers Overall transfer level: Needs assistance Equipment used: Rolling walker (2 wheeled) Transfers: Sit to/from Stand Sit to Stand: Mod assist         General transfer comment: Performed x 3 with cues for safe hand placement and mod A to rise  Ambulation/Gait Ambulation/Gait assistance: Min assist Gait Distance (Feet): 3 Feet Assistive device: Rolling walker (2 wheeled) Gait Pattern/deviations: Step-to pattern;Decreased stride length;Wide base of support Gait velocity: reduced   General Gait Details: Unsteady requiring min A for balance  and control of RW; only took steps toward HOB due to elevated HR, lunch arrived, and pt awaiting thoracentesis   Stairs             Wheelchair Mobility    Modified Rankin (Stroke Patients Only)       Balance Overall balance assessment: Needs assistance Sitting-balance support: Feet supported;No upper extremity supported Sitting balance-Leahy Scale: Fair Sitting balance - Comments: Provided close supervision; sat EOB ~ 10 mins     Standing balance-Leahy Scale: Poor Standing balance comment: Uses RW and min A                            Cognition Arousal/Alertness: Awake/alert Behavior During Therapy: Anxious Overall Cognitive Status: Within Functional Limits for tasks assessed                                 General Comments: Increased time for transfers, prefers addressing one thing at a time (got overwhelmed trying to get BP with standing)      Exercises      General Comments General comments (skin integrity, edema, etc.): Pt's HR 110 bpm rest, up to 145-155 bpm with minimal activity.  Spoke with RN prior to session who reports MDs are aware.  BP was low 100's/50's sitting and standing. O2 sats stable on RA      Pertinent Vitals/Pain Pain Assessment: No/denies pain    Home Living  Prior Function            PT Goals (current goals can now be found in the care plan section) Acute Rehab PT Goals Patient Stated Goal: to walk and get up stairs PT Goal Formulation: With patient/family Time For Goal Achievement: 03/11/21 Potential to Achieve Goals: Good Progress towards PT goals: Progressing toward goals    Frequency    Min 3X/week      PT Plan Current plan remains appropriate    Co-evaluation              AM-PAC PT "6 Clicks" Mobility   Outcome Measure  Help needed turning from your back to your side while in a flat bed without using bedrails?: A Little Help needed moving from lying on  your back to sitting on the side of a flat bed without using bedrails?: A Lot Help needed moving to and from a bed to a chair (including a wheelchair)?: A Lot Help needed standing up from a chair using your arms (e.g., wheelchair or bedside chair)?: A Lot Help needed to walk in hospital room?: A Little Help needed climbing 3-5 steps with a railing? : A Lot 6 Click Score: 14    End of Session Equipment Utilized During Treatment: Gait belt (under arms) Activity Tolerance: Patient limited by fatigue Patient left: with call bell/phone within reach;with family/visitor present;in bed;with bed alarm set Nurse Communication: Mobility status PT Visit Diagnosis: Unsteadiness on feet (R26.81);Muscle weakness (generalized) (M62.81);Difficulty in walking, not elsewhere classified (R26.2);History of falling (Z91.81)     Time: 2952-8413 PT Time Calculation (min) (ACUTE ONLY): 20 min  Charges:  $Therapeutic Activity: 8-22 mins                     Abran Richard, PT Acute Rehab Services Pager [phone] [facility] [place] 03/06/2021, 1:13 PM

## 2021-03-06 NOTE — Progress Notes (Signed)
Subjective: New new complaints.  Needs to have a BM right now.  Denies SOB.  Has a cough with deep inspiration.  ROS: See above, otherwise other systems negative  Objective: Vital signs in last 24 hours: Temp:  [98.3 F (36.8 C)-98.5 F (36.9 C)] 98.5 F (36.9 C) (04/26 2008) Pulse Rate:  [97-112] 106 (04/27 0019) Resp:  [12-20] 20 (04/27 0019) BP: (95-135)/(63-87) 135/87 (04/27 0019) SpO2:  [95 %-97 %] 95 % (04/27 0019) Last BM Date: 03/03/21  Intake/Output from previous day: 04/26 0701 - 04/27 0700 In: 240 [P.O.:240] Out: 900 [Urine:900] Intake/Output this shift: No intake/output data recorded.  PE: Gen: NAD HEENT: PERRL Heart: irregular Lungs: CTAB, but decrease breath sounds at left base.  Some left chest wall tenderness posteriorly as expected Abd: soft, NT, ND Ext: MAE Psych: A&Ox2  Lab Results:  Recent Labs    03/04/21 0532 03/06/21 0453  WBC 4.1 4.9  HGB 12.5 13.2  HCT 37.5 40.3  PLT 227 288   BMET Recent Labs    03/04/21 0532 03/06/21 0453  NA 144 140  K 3.4* 4.3  CL 110 104  CO2 28 27  GLUCOSE 118* 99  BUN 8 11  CREATININE 0.43* 0.52  CALCIUM 8.3* 9.1   PT/INR Recent Labs    03/06/21 0453  LABPROT 12.8  INR 1.0   CMP     Component Value Date/Time   NA 140 03/06/2021 0453   NA 143 10/15/2020 1459   K 4.3 03/06/2021 0453   CL 104 03/06/2021 0453   CO2 27 03/06/2021 0453   GLUCOSE 99 03/06/2021 0453   BUN 11 03/06/2021 0453   BUN 14 10/15/2020 1459   CREATININE 0.52 03/06/2021 0453   CREATININE 0.53 (L) 10/29/2016 1032   CALCIUM 9.1 03/06/2021 0453   PROT 5.8 (L) 03/06/2021 0453   PROT 6.9 10/15/2020 1459   ALBUMIN 2.9 (L) 03/06/2021 0453   ALBUMIN 4.3 10/15/2020 1459   AST 18 03/06/2021 0453   ALT 19 03/06/2021 0453   ALKPHOS 49 03/06/2021 0453   BILITOT 0.7 03/06/2021 0453   BILITOT <0.2 10/15/2020 1459   GFRNONAA >60 03/06/2021 0453   GFRNONAA >89 01/08/2012 1209   GFRAA 109 10/15/2020 1459   GFRAA >89  01/08/2012 1209   Lipase  No results found for: LIPASE     Studies/Results: MR BRAIN W WO CONTRAST  Result Date: 03/04/2021 CLINICAL DATA:  Initial evaluation for slowly progressive weakness in all 4 extremities for several years with recent acceleration, left greater than right. EXAM: MRI HEAD WITHOUT AND WITH CONTRAST MRI CERVICAL SPINE WITHOUT AND WITH CONTRAST TECHNIQUE: Multiplanar, multiecho pulse sequences of the brain and surrounding structures, and cervical spine, to include the craniocervical junction and cervicothoracic junction, were obtained without and with intravenous contrast. CONTRAST:  3.34mL GADAVIST GADOBUTROL 1 MMOL/ML IV SOLN COMPARISON:  Comparison made with previous MRI from 07/06/2018. FINDINGS: MRI HEAD FINDINGS Brain: Mild diffuse prominence of the CSF containing spaces compatible generalized age-related cerebral atrophy, similar to previous. Patchy T2/FLAIR hyperintensity within the periventricular white matter and pons, most consistent with chronic small vessel ischemic disease, mild in nature, but mildly progressed as compared to 2019. Area of encephalomalacia and gliosis at the parasagittal anterior right frontal lobe again seen, which could be related to prior ischemia or possibly trauma, stable. No abnormal foci of restricted diffusion to suggest acute or subacute ischemia. Gray-white matter differentiation otherwise maintained. No other areas of encephalomalacia to suggest chronic cortical  infarction. No acute intracranial hemorrhage. Few small chronic micro hemorrhages noted within the subcortical right parietal region, likely small vessel related. No mass lesion, midline shift or mass effect. No hydrocephalus or extra-axial fluid collection. Pituitary gland suprasellar region within normal limits. Midline structures intact. No abnormal enhancement. Vascular: Major intracranial vascular flow voids are maintained. Skull and upper cervical spine: Craniocervical junction  within normal limits. Bone marrow signal intensity normal. No focal marrow replacing lesion. Soft tissue contusion present at the right parietal scalp. 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 Globes and orbital soft tissues demonstrate no acute finding. Paranasal sinuses are clear. No significant mastoid effusion. Inner ear structures grossly normal. Other: None. MRI CERVICAL SPINE FINDINGS Alignment: Underlying mild levoscoliosis. Reversal of the normal cervical lordosis with apex at C3-4. 2 mm anterolisthesis of C2 on C3, with 2 mm retrolisthesis of C4 on C5. Findings chronic and facet mediated. Vertebrae: Vertebral body height maintained without acute or chronic fracture. Bone marrow signal intensity within normal limits. No discrete or worrisome osseous lesions. No abnormal marrow edema or enhancement. Cord: Normal signal and morphology.  No abnormal enhancement. Posterior Fossa, vertebral arteries, paraspinal tissues: Craniocervical junction within normal limits. Paraspinous and prevertebral soft tissues are normal. Normal flow voids seen within the vertebral arteries bilaterally. Large left pleural effusion partially visualized. Disc levels: C2-C3: 2 mm anterolisthesis. Broad posterior disc osteophyte flattens and partially effaces the ventral thecal sac. Left greater than right facet hypertrophy. No significant spinal stenosis. Mild left C3 foraminal narrowing. Right neural foramina remains patent. Appearance is stable. C3-C4: Degenerative intervertebral disc space narrowing with diffuse disc osteophyte complex. Broad posterior component indents and partially effaces the ventral thecal sac. Mild flattening of the ventral cord without cord signal changes. Mild spinal stenosis. Moderate bilateral C4 foraminal stenosis, slightly worse on the left. Appearance is mildly progressed from previous. C4-C5: 2 mm retrolisthesis. Degenerative intervertebral  disc space narrowing with diffuse disc osteophyte complex. Broad posterior component flattens and partially effaces the ventral thecal sac, asymmetric to the right. Mild ligament flavum hypertrophy. Resultant mild spinal stenosis. Mild cord flattening without cord signal changes. Moderate left with severe right C5 foraminal stenosis. Appearance is mildly progressed from previous. C5-C6: Degenerative intervertebral disc space narrowing with diffuse disc osteophyte complex. Broad posterior component flattens and partially effaces the ventral thecal sac. Superimposed mild right worse than left facet hypertrophy. Resultant mild spinal stenosis with minimal cord flattening. Severe right worse than left C6 foraminal stenosis. Appearance is mildly progressed from previous. C6-C7: Degenerative intervertebral disc space narrowing with diffuse disc osteophyte complex. Broad posterior component indents and partially faces the ventral thecal sac, asymmetric to the left. Superimposed central to left subarticular disc extrusion with inferior migration (series 9, image 25). Right worse than left facet degeneration. Resultant mild spinal stenosis with mild cord flattening, but no cord signal changes. Severe bilateral C7 foraminal stenosis. Appearance is mildly progressed from previous. C7-T1: Negative interspace. Moderate right worse than left facet hypertrophy. No significant spinal stenosis. Foramina remain patent. Visualized upper thoracic spine demonstrates no significant finding. IMPRESSION: MRI HEAD IMPRESSION: 1. No acute intracranial abnormality. 2. Chronic encephalomalacia and gliosis involving the parasagittal anterior right frontal lobe, which could be related to prior infarct or traumatic injury. 3. Mild age-related cerebral atrophy with chronic microvascular ischemic disease, mildly progressed from previous. 4. Right parietal scalp contusion. MRI CERVICAL SPINE IMPRESSION: 1. No acute abnormality within the cervical  spine. 2. Moderate multilevel cervical spondylosis with resultant mild  diffuse spinal stenosis at C3-4 through C6-7, overall mildly progressed as compared to 2019. 3. Multifactorial degenerative changes with resultant multilevel foraminal narrowing as above. Notable findings include moderate bilateral C4 foraminal stenosis, moderate left with severe right C5 foraminal narrowing, with severe bilateral C6 and C7 foraminal stenosis. 4. Layering left pleural effusion, partially visualized. Electronically Signed   By: Jeannine Boga M.D.   On: 03/04/2021 20:39   MR CERVICAL SPINE W WO CONTRAST  Result Date: 03/04/2021 CLINICAL DATA:  Initial evaluation for slowly progressive weakness in all 4 extremities for several years with recent acceleration, left greater than right. EXAM: MRI HEAD WITHOUT AND WITH CONTRAST MRI CERVICAL SPINE WITHOUT AND WITH CONTRAST TECHNIQUE: Multiplanar, multiecho pulse sequences of the brain and surrounding structures, and cervical spine, to include the craniocervical junction and cervicothoracic junction, were obtained without and with intravenous contrast. CONTRAST:  3.63mL GADAVIST GADOBUTROL 1 MMOL/ML IV SOLN COMPARISON:  Comparison made with previous MRI from 07/06/2018. FINDINGS: MRI HEAD FINDINGS Brain: Mild diffuse prominence of the CSF containing spaces compatible generalized age-related cerebral atrophy, similar to previous. Patchy T2/FLAIR hyperintensity within the periventricular white matter and pons, most consistent with chronic small vessel ischemic disease, mild in nature, but mildly progressed as compared to 2019. Area of encephalomalacia and gliosis at the parasagittal anterior right frontal lobe again seen, which could be related to prior ischemia or possibly trauma, stable. No abnormal foci of restricted diffusion to suggest acute or subacute ischemia. Gray-white matter differentiation otherwise maintained. No other areas of encephalomalacia to suggest chronic  cortical infarction. No acute intracranial hemorrhage. Few small chronic micro hemorrhages noted within the subcortical right parietal region, likely small vessel related. No mass lesion, midline shift or mass effect. No hydrocephalus or extra-axial fluid collection. Pituitary gland suprasellar region within normal limits. Midline structures intact. No abnormal enhancement. Vascular: Major intracranial vascular flow voids are maintained. Skull and upper cervical spine: Craniocervical junction within normal limits. Bone marrow signal intensity normal. No focal marrow replacing lesion. Soft tissue contusion present at the right parietal scalp. 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 Globes and orbital soft tissues demonstrate no acute finding. Paranasal sinuses are clear. No significant mastoid effusion. Inner ear structures grossly normal. Other: None. MRI CERVICAL SPINE FINDINGS Alignment: Underlying mild levoscoliosis. Reversal of the normal cervical lordosis with apex at C3-4. 2 mm anterolisthesis of C2 on C3, with 2 mm retrolisthesis of C4 on C5. Findings chronic and facet mediated. Vertebrae: Vertebral body height maintained without acute or chronic fracture. Bone marrow signal intensity within normal limits. No discrete or worrisome osseous lesions. No abnormal marrow edema or enhancement. Cord: Normal signal and morphology.  No abnormal enhancement. Posterior Fossa, vertebral arteries, paraspinal tissues: Craniocervical junction within normal limits. Paraspinous and prevertebral soft tissues are normal. Normal flow voids seen within the vertebral arteries bilaterally. Large left pleural effusion partially visualized. Disc levels: C2-C3: 2 mm anterolisthesis. Broad posterior disc osteophyte flattens and partially effaces the ventral thecal sac. Left greater than right facet hypertrophy. No significant spinal stenosis. Mild left C3 foraminal  narrowing. Right neural foramina remains patent. Appearance is stable. C3-C4: Degenerative intervertebral disc space narrowing with diffuse disc osteophyte complex. Broad posterior component indents and partially effaces the ventral thecal sac. Mild flattening of the ventral cord without cord signal changes. Mild spinal stenosis. Moderate bilateral C4 foraminal stenosis, slightly worse on the left. Appearance is mildly progressed from previous. C4-C5: 2 mm retrolisthesis. Degenerative intervertebral disc space narrowing with  diffuse disc osteophyte complex. Broad posterior component flattens and partially effaces the ventral thecal sac, asymmetric to the right. Mild ligament flavum hypertrophy. Resultant mild spinal stenosis. Mild cord flattening without cord signal changes. Moderate left with severe right C5 foraminal stenosis. Appearance is mildly progressed from previous. C5-C6: Degenerative intervertebral disc space narrowing with diffuse disc osteophyte complex. Broad posterior component flattens and partially effaces the ventral thecal sac. Superimposed mild right worse than left facet hypertrophy. Resultant mild spinal stenosis with minimal cord flattening. Severe right worse than left C6 foraminal stenosis. Appearance is mildly progressed from previous. C6-C7: Degenerative intervertebral disc space narrowing with diffuse disc osteophyte complex. Broad posterior component indents and partially faces the ventral thecal sac, asymmetric to the left. Superimposed central to left subarticular disc extrusion with inferior migration (series 9, image 25). Right worse than left facet degeneration. Resultant mild spinal stenosis with mild cord flattening, but no cord signal changes. Severe bilateral C7 foraminal stenosis. Appearance is mildly progressed from previous. C7-T1: Negative interspace. Moderate right worse than left facet hypertrophy. No significant spinal stenosis. Foramina remain patent. Visualized upper  thoracic spine demonstrates no significant finding. IMPRESSION: MRI HEAD IMPRESSION: 1. No acute intracranial abnormality. 2. Chronic encephalomalacia and gliosis involving the parasagittal anterior right frontal lobe, which could be related to prior infarct or traumatic injury. 3. Mild age-related cerebral atrophy with chronic microvascular ischemic disease, mildly progressed from previous. 4. Right parietal scalp contusion. MRI CERVICAL SPINE IMPRESSION: 1. No acute abnormality within the cervical spine. 2. Moderate multilevel cervical spondylosis with resultant mild diffuse spinal stenosis at C3-4 through C6-7, overall mildly progressed as compared to 2019. 3. Multifactorial degenerative changes with resultant multilevel foraminal narrowing as above. Notable findings include moderate bilateral C4 foraminal stenosis, moderate left with severe right C5 foraminal narrowing, with severe bilateral C6 and C7 foraminal stenosis. 4. Layering left pleural effusion, partially visualized. Electronically Signed   By: Jeannine Boga M.D.   On: 03/04/2021 20:39   DG CHEST PORT 1 VIEW  Result Date: 03/05/2021 CLINICAL DATA:  Left hemo pneumothorax EXAM: PORTABLE CHEST 1 VIEW COMPARISON:  03/04/2021 FINDINGS: Left apical pneumothorax has resolved. Moderate left pleural fluid persists. Multiple acute left rib fractures are again identified. The right lung is clear. No pneumothorax or pleural effusion on the right. Cardiac size is within normal limits. The pulmonary vascularity is normal. IMPRESSION: Resolved left apical pneumothorax. Stable moderate left pleural effusion. Multiple acute left rib fractures again noted. Electronically Signed   By: Fidela Salisbury MD   On: 03/05/2021 06:26   ECHOCARDIOGRAM COMPLETE  Result Date: 03/05/2021    ECHOCARDIOGRAM REPORT   Patient Name:   Connie Chase Date of Exam: 03/05/2021 Medical Rec #:  [id number]   Height:       62.5 in Accession #:    [id number]  Weight:       85.5 lb Date  of Birth:  Jan 22, 1946   BSA:          1.340 m Patient Age:    24 years    BP:           106/65 mmHg Patient Gender: F           HR:           95 bpm. Exam Location:  Inpatient Procedure: 2D Echo, Cardiac Doppler and Color Doppler Indications:    Tachycardia  History:        Patient has prior history of Echocardiogram examinations, most  recent 02/16/2015. Arrythmias:Atrial Fibrillation.  Sonographer:    Luisa Hart RDCS Referring Phys: [id number] Josetta Huddle PATEL  Sonographer Comments: [id number] cardioversions 09/06/2015 AF ablation 09/04/2016 AF ablation IMPRESSIONS  1. Left ventricular ejection fraction, by estimation, is 60 to 65%. The left ventricle has normal function. The left ventricle has no regional wall motion abnormalities. There is mild concentric left ventricular hypertrophy. Left ventricular diastolic function could not be evaluated.  2. Right ventricular systolic function is normal. The right ventricular size is normal. There is mildly elevated pulmonary artery systolic pressure.  3. Large pleural effusion in the left lateral region. Large fibrinous masses/clots are seen in the pleural space.  4. The mitral valve is normal in structure. No evidence of mitral valve regurgitation.  5. Tricuspid valve regurgitation is mild to moderate.  6. The aortic valve is normal in structure. Aortic valve regurgitation is not visualized. Mild aortic valve sclerosis is present, with no evidence of aortic valve stenosis.  7. The inferior vena cava is dilated in size with <50% respiratory variability, suggesting right atrial pressure of 15 mmHg. FINDINGS  Left Ventricle: Left ventricular ejection fraction, by estimation, is 60 to 65%. The left ventricle has normal function. The left ventricle has no regional wall motion abnormalities. The left ventricular internal cavity size was normal in size. There is  mild concentric left ventricular hypertrophy. Left ventricular diastolic function could not be evaluated  due to atrial fibrillation. Left ventricular diastolic function could not be evaluated. Right Ventricle: The right ventricular size is normal. No increase in right ventricular wall thickness. Right ventricular systolic function is normal. There is mildly elevated pulmonary artery systolic pressure. The tricuspid regurgitant velocity is 2.62  m/s, and with an assumed right atrial pressure of 15 mmHg, the estimated right ventricular systolic pressure is 32.3 mmHg. Left Atrium: Left atrial size was normal in size. Right Atrium: Right atrial size was normal in size. Pericardium: There is no evidence of pericardial effusion. Mitral Valve: The mitral valve is normal in structure. There is mild thickening of the mitral valve leaflet(s). Mild mitral annular calcification. No evidence of mitral valve regurgitation. Tricuspid Valve: The tricuspid valve is normal in structure. Tricuspid valve regurgitation is mild to moderate. Aortic Valve: The aortic valve is normal in structure. Aortic valve regurgitation is not visualized. Mild aortic valve sclerosis is present, with no evidence of aortic valve stenosis. Aortic valve mean gradient measures 3.0 mmHg. Aortic valve peak gradient measures 6.2 mmHg. Aortic valve area, by VTI measures 1.05 cm. Pulmonic Valve: The pulmonic valve was normal in structure. Pulmonic valve regurgitation is not visualized. Aorta: The aortic root is normal in size and structure. Venous: The inferior vena cava is dilated in size with less than 50% respiratory variability, suggesting right atrial pressure of 15 mmHg. IAS/Shunts: No atrial level shunt detected by color flow Doppler. Additional Comments: There is a large pleural effusion in the left lateral region.  LEFT VENTRICLE PLAX 2D LVIDd:         3.40 cm LVIDs:         1.60 cm LV PW:         1.30 cm LV IVS:        1.30 cm LVOT diam:     1.70 cm LV SV:         22 LV SV Index:   17 LVOT Area:     2.27 cm  LV Volumes (MOD) LV vol d, MOD A2C: 26.0 ml LV  vol d, MOD A4C:  33.1 ml LV vol s, MOD A4C: 13.6 ml LV SV MOD A4C:     33.1 ml RIGHT VENTRICLE RV S prime:     11.40 cm/s TAPSE (M-mode): 1.7 cm LEFT ATRIUM           Index       RIGHT ATRIUM           Index LA diam:      3.10 cm 2.31 cm/m  RA Area:     12.40 cm LA Vol (A2C): 17.9 ml 13.36 ml/m RA Volume:   27.00 ml  20.15 ml/m LA Vol (A4C): 23.0 ml 17.16 ml/m  AORTIC VALVE                   PULMONIC VALVE AV Area (Vmax):    1.30 cm    PV Vmax:       0.81 m/s AV Area (Vmean):   1.21 cm    PV Vmean:      51.400 cm/s AV Area (VTI):     1.05 cm    PV VTI:        0.127 m AV Vmax:           125.00 cm/s PV Peak grad:  2.6 mmHg AV Vmean:          83.300 cm/s PV Mean grad:  1.0 mmHg AV VTI:            0.214 m AV Peak Grad:      6.2 mmHg AV Mean Grad:      3.0 mmHg LVOT Vmax:         71.80 cm/s LVOT Vmean:        44.300 cm/s LVOT VTI:          0.099 m LVOT/AV VTI ratio: 0.46  AORTA Ao Root diam: 3.00 cm MITRAL VALVE               TRICUSPID VALVE MV Area (PHT): 3.65 cm    TR Peak grad:   27.5 mmHg MV Decel Time: 208 msec    TR Vmax:        262.00 cm/s MV E velocity: 78.00 cm/s                            SHUNTS                            Systemic VTI:  0.10 m                            Systemic Diam: 1.70 cm Dani Gobble Croitoru MD Electronically signed by Sanda Klein MD Signature Date/Time: 03/05/2021/12:40:10 PM    Final     Anti-infectives: Anti-infectives (From admission, onward)   Start     Dose/Rate Route Frequency Ordered Stop   03/05/21 0700  entecavir (BARACLUDE) tablet 1 mg        1 mg Oral Every morning 03/04/21 1408         Assessment/Plan Fall L rib fxs 5-10 with associated occult apical ptx + small/mod hemothorax on L- multimodal pain control as needed.  CXR this am with no further PTX, but still with effusion.  IR thoracentesis today.  Multiple recent falls - PT/OT recommend SNF.  Neuro has seen with MR brain and c-spine ordered and recommend further work up as outpatient Hep B/cirrhosis -  per medicine A fib - per medicine on cardizem gtt FEN - regular diet VTE - eliquis on hold, defer to medicine on resumption of this and as above risks vs benefits given her fall history. ID - none  Dispo - no further trauma interventions.  She may follow up with her PCP as needed for her rib fractures.  IR to plan for thoracentesis today.  We will sign off.  Please call us back as needed.   LOS: 2 days    Henreitta Cea , [facility] 03/06/2021, 7:44 AM Please see Amion for pager number during day hours 7:00am-4:30pm or 7:00am -11:30am on weekends

## 2021-03-07 ENCOUNTER — Inpatient Hospital Stay (HOSPITAL_COMMUNITY): Payer: Medicare Other

## 2021-03-07 DIAGNOSIS — S2242XA Multiple fractures of ribs, left side, initial encounter for closed fracture: Secondary | ICD-10-CM | POA: Diagnosis not present

## 2021-03-07 LAB — CBC WITH DIFFERENTIAL/PLATELET
Abs Immature Granulocytes: 0.04 10*3/uL (ref 0.00–0.07)
Basophils Absolute: 0 10*3/uL (ref 0.0–0.1)
Basophils Relative: 1 %
Eosinophils Absolute: 0.2 10*3/uL (ref 0.0–0.5)
Eosinophils Relative: 4 %
HCT: 37.7 % (ref 36.0–46.0)
Hemoglobin: 12.7 g/dL (ref 12.0–15.0)
Immature Granulocytes: 1 %
Lymphocytes Relative: 13 %
Lymphs Abs: 0.5 10*3/uL — ABNORMAL LOW (ref 0.7–4.0)
MCH: 34.2 pg — ABNORMAL HIGH (ref 26.0–34.0)
MCHC: 33.7 g/dL (ref 30.0–36.0)
MCV: 101.6 fL — ABNORMAL HIGH (ref 80.0–100.0)
Monocytes Absolute: 0.5 10*3/uL (ref 0.1–1.0)
Monocytes Relative: 12 %
Neutro Abs: 2.9 10*3/uL (ref 1.7–7.7)
Neutrophils Relative %: 69 %
Platelets: 286 10*3/uL (ref 150–400)
RBC: 3.71 MIL/uL — ABNORMAL LOW (ref 3.87–5.11)
RDW: 12.4 % (ref 11.5–15.5)
WBC: 4.2 10*3/uL (ref 4.0–10.5)
nRBC: 0 % (ref 0.0–0.2)

## 2021-03-07 LAB — BASIC METABOLIC PANEL
Anion gap: 8 (ref 5–15)
BUN: 12 mg/dL (ref 8–23)
CO2: 29 mmol/L (ref 22–32)
Calcium: 8.6 mg/dL — ABNORMAL LOW (ref 8.9–10.3)
Chloride: 102 mmol/L (ref 98–111)
Creatinine, Ser: 0.44 mg/dL (ref 0.44–1.00)
GFR, Estimated: >60 mL/min (ref >60)
Glucose, Bld: 106 mg/dL — ABNORMAL HIGH (ref 70–99)
Potassium: 3.2 mmol/L — ABNORMAL LOW (ref 3.5–5.1)
Sodium: 139 mmol/L (ref 135–145)

## 2021-03-07 LAB — HEPATITIS B DNA, ULTRAQUANTITATIVE, PCR
HBV DNA SERPL PCR-ACNC: NOT DETECTED IU/mL
HBV DNA SERPL PCR-LOG IU: UNDETERMINED log10 IU/mL

## 2021-03-07 LAB — HEPATITIS B E ANTIBODY: Hep B E Ab: POSITIVE — AB

## 2021-03-07 MED ORDER — POTASSIUM CHLORIDE CRYS ER 20 MEQ PO TBCR
40.0000 meq | EXTENDED_RELEASE_TABLET | Freq: Once | ORAL | Status: AC
  Administered 2021-03-07: 40 meq via ORAL
  Filled 2021-03-07: qty 2

## 2021-03-07 MED ORDER — DILTIAZEM HCL ER COATED BEADS 180 MG PO CP24
180.0000 mg | ORAL_CAPSULE | Freq: Every day | ORAL | Status: DC
  Administered 2021-03-07 – 2021-03-08 (×2): 180 mg via ORAL
  Filled 2021-03-07 (×2): qty 1

## 2021-03-07 NOTE — Progress Notes (Signed)
[facility] Progress Note  Patient: Connie Chase    FUX:[id number]  DOA: 03/03/2021     Date of Service: the patient was seen and examined on 03/07/2021  Brief hospital course: Past medical history of chronic A. 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  Presents with complaints of recurrent fall and found to have right apical pneumothorax, left multiple rib fractures leading to hemopneumothorax.  Also has A. fib with RVR and severe constipation.  Trauma surgery on board but signed off today.  PT OT recommends SNF.  Assessment and Plan: 1.  Recurrent fall. Patient reports gait disorder with recurrent fall, she tells that her legs give out. She has extensive evaluation including EMG done at [facility]. Neurology was consulted for further assistance CT C-spine, MRI brain, CT head unremarkable for any acute abnormality. Currently no further inpatient work-up, outpatient follow-up recommended.  Neurology signed off.  PT recommends SNF.  2.  Right small apical pneumothorax. Left hemopneumothorax Left multiple rib fractures, acute, multiple other chronic rib fractures. Trauma surgery signed off.  Status post left-sided thoracentesis with 600 cc removal on 03/06/2021.  Patient feels much better.  Still shows very minimal left upper lobe pneumothorax.  Improvement in pleural effusion.  3.  Chronic A. fib with RVR Patient is on 30 mg Cardizem breakfast and lunch and 120 mg Cardizem CD at bedtime. She was supposed to be on Tikosyn but since she identified that she remained in A. fib despite being on Tikosyn she stopped taking the medicine on her own somewhere in the beginning of 2022. Continues to take anticoagulation at home.  Patient has been having episodes of rapid heart rate which is going up to 140s and sometimes 150s however patient remains asymptomatic.  Her heart rate was ranging around 138 this morning when  she was only eating her breakfast.  Lengthy discussion with the husband where I recommended stopping short acting Cardizem and increasing her long-acting Cardizem CD to 240 mg.  Has been very reluctant for me to make any changes.  He has faith in patient's cardiologist and he does not want to change anything until his cardiologist approves.  I had advised him to reach out to his cardiologist and discussed with him or provide me with his direct phone number so I can discuss with him.  He finally agreed to increase Cardizem CD to 180 mg tonight.  Continue current short acting Cardizem dose.  Monitor for another day.  4.  Chronic anticoagulation with recurrent fall Anticoagulation is stopped due to history and risk of recurrent falls.  Will start on a low-dose aspirin once she is more stable.  5.  Severe constipation: She had a bowel movement.  Continue with constipation regimen.  6.  Sternal fracture.  Chronic appearing RVR. X-ray shows subacute versus chronic appearing lower xiphoid fracture as well. Patient does not have any symptoms of chest pain there. Echocardiogram reassuring, normal EF, no pericardial effusion.  7.  Hypokalemia. 3.2.  Will replace.  Recheck in the morning.  8.  Underweight/cachexia. Dietary consulted. Currently on regular diet.  Also on MVI.  Monitor. Body mass index is 15.39 kg/m.  Nutrition Problem: Severe Malnutrition Etiology: chronic illness (cirrhosis) Interventions: Interventions: MVI,Liberalize Diet,Refer to RD note for recommendations  9.  Right scalp injury. From her prior fall but it actually had mild oozing during her current fall as well. No further work-up recommended as there is no acute bleeding or  swelling or acute abnormality on the MRI brain with and without contrast.  10.  Dilated CBD. Incidentally seen on the CT scan.  Currently no evidence of acute abdominal pain or symptoms. Also LFTs are stable. No further work-up.  11.  Goals of care  conversation. Patient has recurrent fall and has multiple comorbidities and appears to be severely cachectic. In the setting of cardiac arrest patient will not have good outcome.  Currently she is full code.  Previous hospitalist likely had conversation with them.  I will initiate another conversation tomorrow  Diet: Regular diet DVT Prophylaxis:   Place and maintain sequential compression device Start: 03/04/21 1431    Advance goals of care discussion: Full code  Family Communication: Husband present at the bedside.  Disposition:  Status is: Inpatient  Remains inpatient appropriate because:Ongoing active pain requiring inpatient pain management and Ongoing diagnostic testing needed not appropriate for outpatient work up   Dispo: The patient is from: Home              Anticipated d/c is to: SNF              Patient currently is not medically stable to d/c.   Difficult to place patient No  Subjective: Patient seen and examined.  Husband at bedside.  Patient eating breakfast, her heart rates were going up to 130 but she was not having symptoms.  She denied any shortness of breath or chest pain.  Physical Exam:  General exam: Appears calm and comfortable  Respiratory system: Slightly diminished breath sounds. Respiratory effort normal. Cardiovascular system: S1 & S2 heard irregularly irregular rate and rhythm. No JVD, murmurs, rubs, gallops or clicks. No pedal edema. Gastrointestinal system: Abdomen is nondistended, soft and nontender. No organomegaly or masses felt. Normal bowel sounds heard. Central nervous system: Alert and oriented. No focal neurological deficits. Extremities: Symmetric 5 x 5 power. Skin: No rashes, lesions or ulcers.  Psychiatry: Judgement and insight appear normal. Mood & affect appropriate.    Vitals:   03/07/21 0400 03/07/21 0721 03/07/21 0800 03/07/21 1130  BP: 106/64 100/65 110/64 95/70  Pulse: 78 (!) 104 (!) 102 99  Resp: 16 20 18 20   Temp: 98.5 F  (36.9 C)  98.4 F (36.9 C) 98.1 F (36.7 C)  TempSrc: Oral  Oral Oral  SpO2:  96% 97% 95%  Weight:      Height:        Intake/Output Summary (Last 24 hours) at 03/07/2021 1221 Last data filed at 03/07/2021 0745 Gross per 24 hour  Intake --  Output 550 ml  Net -550 ml   Filed Weights   03/04/21 0325  Weight: 38.8 kg    Data Reviewed: I have personally reviewed and interpreted daily labs, tele strips, imaging. I reviewed all nursing notes, pharmacy notes, vitals, pertinent old records I have discussed plan of care as described above with RN and patient/family.  CBC: Recent Labs  Lab 03/03/21 1629 03/04/21 0532 03/06/21 0453 03/07/21 0306  WBC 6.2 4.1 4.9 4.2  NEUTROABS 4.9  --  3.6 2.9  HGB 12.6 12.5 13.2 12.7  HCT 36.9 37.5 40.3 37.7  MCV 102.8* 103.9* 104.1* 101.6*  PLT 225 227 288 469   Basic Metabolic Panel: Recent Labs  Lab 03/03/21 2034 03/03/21 2055 03/04/21 0532 03/06/21 0453 03/07/21 0306  NA 140  --  144 140 139  K 4.0  --  3.4* 4.3 3.2*  CL 108  --  110 104 102  CO2 23  --  28 27 29   GLUCOSE 122*  --  118* 99 106*  BUN 13  --  8 11 12   CREATININE 0.44 0.40* 0.43* 0.52 0.44  CALCIUM 8.6*  --  8.3* 9.1 8.6*  MG  --   --   --  2.2  --     Studies: DG Chest 1 View  Result Date: 03/06/2021 CLINICAL DATA:  Post left thoracentesis. EXAM: CHEST  1 VIEW COMPARISON:  Chest radiograph yesterday. FINDINGS: Decreased left pleural fluid after thoracentesis. Streaky opacities at the left lung base, favor atelectasis. Possible but not definite tiny left apical pneumothorax. The heart is normal in size. Unchanged mediastinal contours with aortic atherosclerosis. Right lung is clear. Left rib fractures again seen. IMPRESSION: Decreased left pleural fluid after thoracentesis. Possible but not definite tiny left apical pneumothorax. Electronically Signed   By: Keith Rake M.D.   On: 03/06/2021 15:43   DG CHEST PORT 1 VIEW  Result Date: 03/07/2021 CLINICAL  DATA:  Shortness of breath. EXAM: PORTABLE CHEST 1 VIEW COMPARISON:  March 06, 2021. FINDINGS: The heart size and mediastinal contours are within normal limits. Right lung is clear. Probable minimal left apical pneumothorax is noted which is not significantly changed compared to prior exam. Stable left basilar atelectasis is noted with probable small left pleural effusion. Multiple comminuted and segmental fractures are seen involving the left ribs as noted on prior exam. IMPRESSION: Probable minimal left apical pneumothorax. Stable left pleural effusion with associated left basilar atelectasis. Stable multiple left rib fractures as noted above. Electronically Signed   By: Marijo Conception M.D.   On: 03/07/2021 08:51   IR THORACENTESIS ASP PLEURAL SPACE W/IMG GUIDE  Result Date: 03/06/2021 INDICATION: Shortness of breath. Left-sided pleural effusion after fall. Request for therapeutic thoracentesis. EXAM: ULTRASOUND GUIDED LEFT THORACENTESIS MEDICATIONS: 1% plain lidocaine, 5 mL COMPLICATIONS: None immediate. PROCEDURE: An ultrasound guided thoracentesis was thoroughly discussed with the patient and questions answered. The benefits, risks, alternatives and complications were also discussed. The patient understands and wishes to proceed with the procedure. Written consent was obtained. Ultrasound was performed to localize and mark an adequate pocket of fluid in the left chest. The area was then prepped and draped in the normal sterile fashion. 1% Lidocaine was used for local anesthesia. Under ultrasound guidance a 6 Fr Safe-T-Centesis catheter was introduced. Thoracentesis was performed. The catheter was removed and a dressing applied. FINDINGS: A total of approximately 600 mL of thin, dark bloody fluid was removed. IMPRESSION: Successful ultrasound guided left thoracentesis yielding 600 mL of pleural fluid. Read by: Ascencion Dike PA-C Electronically Signed   By: Jerilynn Mages.  Shick M.D.   On: 03/06/2021 15:55     Scheduled Meds: . diltiazem  180 mg Oral QHS  . diltiazem  30 mg Oral BID  . docusate sodium  100 mg Oral BID  . dorzolamide  1 drop Both Eyes BID  . entecavir  1 mg Oral q AM  . multivitamin with minerals  1 tablet Oral Daily  . polyethylene glycol  17 g Oral Daily   Continuous Infusions: . lactated ringers 100 mL/hr at 03/07/21 0850   PRN Meds: acetaminophen **OR** acetaminophen, HYDROmorphone (DILAUDID) injection, lidocaine (PF), ondansetron **OR** ondansetron (ZOFRAN) IV, senna-docusate, traMADol  Time spent: 30 minutes  Author: Darliss Cheney, MD [facility] 03/07/2021 12:21 PM  To reach On-call, see care teams to locate the attending and reach out via [email or web address]. Between 7PM-7AM, please contact night-coverage If you still have difficulty reaching the attending provider,  please page the [facility] (Director on Call) for [facility] on amion for assistance.

## 2021-03-07 NOTE — Plan of Care (Signed)

## 2021-03-07 NOTE — Care Management Important Message (Signed)
Important Message  Patient Details  Name: Connie Chase MRN: [id number] Date of Birth: 05/16/1946   Medicare Important Message Given:  Yes     Liat Mayol Montine Circle 03/07/2021, 12:09 PM

## 2021-03-07 NOTE — Progress Notes (Signed)
Pt. Heart rate noted to be sustaining in the 140/150. MD made aware, said he is aware no new orders at this time.

## 2021-03-08 DIAGNOSIS — S2242XA Multiple fractures of ribs, left side, initial encounter for closed fracture: Secondary | ICD-10-CM | POA: Diagnosis not present

## 2021-03-08 LAB — CBC WITH DIFFERENTIAL/PLATELET
Abs Immature Granulocytes: 0.04 10*3/uL (ref 0.00–0.07)
Basophils Absolute: 0 10*3/uL (ref 0.0–0.1)
Basophils Relative: 1 %
Eosinophils Absolute: 0.1 10*3/uL (ref 0.0–0.5)
Eosinophils Relative: 2 %
HCT: 39.3 % (ref 36.0–46.0)
Hemoglobin: 13.4 g/dL (ref 12.0–15.0)
Immature Granulocytes: 1 %
Lymphocytes Relative: 11 %
Lymphs Abs: 0.5 10*3/uL — ABNORMAL LOW (ref 0.7–4.0)
MCH: 34.5 pg — ABNORMAL HIGH (ref 26.0–34.0)
MCHC: 34.1 g/dL (ref 30.0–36.0)
MCV: 101.3 fL — ABNORMAL HIGH (ref 80.0–100.0)
Monocytes Absolute: 0.5 10*3/uL (ref 0.1–1.0)
Monocytes Relative: 11 %
Neutro Abs: 3.7 10*3/uL (ref 1.7–7.7)
Neutrophils Relative %: 74 %
Platelets: 298 10*3/uL (ref 150–400)
RBC: 3.88 MIL/uL (ref 3.87–5.11)
RDW: 12.3 % (ref 11.5–15.5)
WBC: 4.9 10*3/uL (ref 4.0–10.5)
nRBC: 0 % (ref 0.0–0.2)

## 2021-03-08 LAB — BASIC METABOLIC PANEL
Anion gap: 6 (ref 5–15)
BUN: 10 mg/dL (ref 8–23)
CO2: 30 mmol/L (ref 22–32)
Calcium: 8.9 mg/dL (ref 8.9–10.3)
Chloride: 104 mmol/L (ref 98–111)
Creatinine, Ser: 0.54 mg/dL (ref 0.44–1.00)
GFR, Estimated: >60 mL/min (ref >60)
Glucose, Bld: 106 mg/dL — ABNORMAL HIGH (ref 70–99)
Potassium: 4.1 mmol/L (ref 3.5–5.1)
Sodium: 140 mmol/L (ref 135–145)

## 2021-03-08 LAB — MAGNESIUM: Magnesium: 2 mg/dL (ref 1.7–2.4)

## 2021-03-08 MED ORDER — POTASSIUM CHLORIDE CRYS ER 20 MEQ PO TBCR
40.0000 meq | EXTENDED_RELEASE_TABLET | Freq: Once | ORAL | Status: AC
  Administered 2021-03-08: 40 meq via ORAL
  Filled 2021-03-08: qty 2

## 2021-03-08 NOTE — TOC Progression Note (Addendum)
Transition of Care [facility]) - Progression Note    Patient Details  Name: Connie Chase MRN: [id number] Date of Birth: 1945/12/07  [facility] CM/SW Contact  Angelita Ingles, RN Phone Number: [phone]  03/08/2021, 9:18 AM  Clinical Narrative:    CM received message that patient and husband want SNF placement for rehab. CM at bedside and husband verbalized interest in rehab. List for choice has been provided and CM to initiate SNF workup.   1028 FL2 completed, PASRR [id number],  Insurance auth initiated and patient info have been faxed out for bed offers.  TOC will continue to follow for placement.       Expected Discharge Plan and Services                                                 Social Determinants of Health (SDOH) Interventions    Readmission Risk Interventions No flowsheet data found.

## 2021-03-08 NOTE — Progress Notes (Signed)
Occupational Therapy Treatment Patient Details Name: Carolan Avedisian MRN: [id number] DOB: 1946/04/27 Today's Date: 03/08/2021    History of present illness 75 yo female with onset of falls at home on hard floors was admitted due to SOB on 03/03/21, found to have hemopneumothorax and L side rib fractures from 5-10, due to legs giving out.  Has been struggling with gait, having increased difficulty in last few days prior to admission.  PMHx:  viral Hep B, cirrhosis, fibroids, osteoporosis, a-fib, prediabetes, atherosclerosis, cardioversion, ablation, cataract repair, OA   OT comments  Pt progressing towards acute OT goals. Focus of session was initiating BUE strengthening program with level 1 theraband and hand squeeze ball. Printout of exercises provided. Spouse present and included in education. D/c plan remains appropriate.    Follow Up Recommendations  SNF    Equipment Recommendations  3 in 1 bedside commode    Recommendations for Other Services      Precautions / Restrictions Precautions Precautions: Fall Precaution Comments: urinary frequency Restrictions Weight Bearing Restrictions: No       Mobility Bed Mobility               General bed mobility comments: declined. recently back to bed    Transfers                 General transfer comment: pt declined 2/2 recently back to bed    Balance                                           ADL either performed or assessed with clinical judgement   ADL Overall ADL's : Needs assistance/impaired                                       General ADL Comments: Pt recently back to bed after working with PT and sitting up in the recliner. Declined OOB activity. Focused on UB strengthening.     Vision   Vision Assessment?: Vision impaired- to be further tested in functional context Additional Comments: Pt's L eye severely adducts when looking to L. Continue to assess.   Perception      Praxis      Cognition Arousal/Alertness: Awake/alert Behavior During Therapy: Anxious Overall Cognitive Status: Within Functional Limits for tasks assessed                                          Exercises Exercises: Other exercises Other Exercises Other Exercises: Instructed in 2 BUE exercises using level 1 theraband and provided hand squeeze ball to work on intrinsics. Handout provided detailing exercises. Spouse present and included in education   Shoulder Instructions       General Comments HR low 90s supine in bed    Pertinent Vitals/ Pain       Pain Assessment: No/denies pain  Home Living                                          Prior Functioning/Environment              Frequency  Min 2X/week  Progress Toward Goals  OT Goals(current goals can now be found in the care plan section)  Progress towards OT goals: Progressing toward goals  Acute Rehab OT Goals Patient Stated Goal: to walk and get up stairs OT Goal Formulation: With patient Time For Goal Achievement: 03/19/21 Potential to Achieve Goals: Good ADL Goals Pt Will Perform Eating: with set-up;sitting Pt Will Perform Grooming: with min guard assist;standing Pt Will Perform Lower Body Dressing: with min assist;sit to/from stand Pt Will Transfer to Toilet: with min guard assist;ambulating Pt Will Perform Toileting - Clothing Manipulation and hygiene: sit to/from stand;with min guard assist Pt/caregiver will Perform Home Exercise Program: Increased strength;Both right and left upper extremity;With theraband;With Supervision  Plan Discharge plan remains appropriate    Co-evaluation                 AM-PAC OT "6 Clicks" Daily Activity     Outcome Measure   Help from another person eating meals?: A Lot Help from another person taking care of personal grooming?: A Little Help from another person toileting, which includes using toliet, bedpan, or  urinal?: A Little Help from another person bathing (including washing, rinsing, drying)?: A Little Help from another person to put on and taking off regular upper body clothing?: A Lot Help from another person to put on and taking off regular lower body clothing?: A Lot 6 Click Score: 15    End of Session    OT Visit Diagnosis: Unsteadiness on feet (R26.81);Muscle weakness (generalized) (M62.81)   Activity Tolerance Patient limited by fatigue   Patient Left in bed;with call bell/phone within reach;with bed alarm set;with family/visitor present   Nurse Communication          Time: 0539-7673 OT Time Calculation (min): 8 min  Charges: OT General Charges $OT Visit: 1 Visit OT Treatments $Therapeutic Exercise: 8-22 mins  Tyrone Schimke, OT Acute Rehabilitation Services Pager: [phone] Office: [phone]   Hortencia Pilar 03/08/2021, 1:02 PM

## 2021-03-08 NOTE — Progress Notes (Signed)
[facility] Progress Note  Patient: Connie Chase    ZDG:[id number]  DOA: 03/03/2021     Date of Service: the patient was seen and examined on 03/08/2021  Brief hospital course: Connie Chase is a 75 y.o. female with Past medical history of chronic A. 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  Also has A. fib with RVR and severe constipation. CT C-spine, MRI brain, CT head unremarkable for any acute abnormality. Trauma surgery saw her and signed off. Status post left-sided thoracentesis with 600 cc removal on 03/06/2021.  PT OT recommends SNF.  Patient's anticoagulation has been stopped since she has risk of more spontaneous bleeding due to recurrent fall.  Assessment and Plan: 1.  Recurrent fall. Patient reports gait disorder with recurrent fall, she tells that her legs give out. She has extensive evaluation including EMG done at [facility]. Neurology was consulted for further assistance CT C-spine, MRI brain, CT head unremarkable for any acute abnormality. Currently no further inpatient work-up, outpatient follow-up recommended.  Neurology signed off.  PT recommends SNF.  2.  Right small apical pneumothorax. Left hemopneumothorax Left multiple rib fractures, acute, multiple other chronic rib fractures. Trauma surgery signed off.  Status post left-sided thoracentesis with 600 cc removal on 03/06/2021.  Patient feels much better and she is off of oxygen now.  3.  Chronic A. fib with RVR Patient is on 30 mg Cardizem breakfast and lunch and 120 mg Cardizem CD at bedtime. She was supposed to be on Tikosyn but since she identified that she remained in A. fib despite being on Tikosyn she stopped taking the medicine on her own somewhere in the beginning of 2022. Continues to  take anticoagulation at home.  Patient has been having episodes of rapid heart rate which is going up to 140s and sometimes 150s however patient remains asymptomatic.  Her heart rate was ranging around 138 this morning when she was only eating her breakfast 03/07/2021.  Lengthy discussion with the husband where I recommended stopping short acting Cardizem and increasing her long-acting Cardizem CD to 240 mg.  Husband was very reluctant for me to make changes as he wanted patient's primary cardiologist to make the decisions.  Eventually he agreed for me to increase Cardizem CD to 180 mg daily and continue short acting Cardizem at lunch and at morning time.  Patient's heart rate has remained much better controlled.  4.  Chronic anticoagulation with recurrent fall Anticoagulation is stopped due to history and risk of recurrent falls.  Will start on a low-dose aspirin once she is more stable.  5.  Severe constipation: Resolved.  6.  Sternal fracture.  Chronic appearing RVR. X-ray shows subacute versus chronic appearing lower xiphoid fracture as well. Patient does not have any symptoms of chest pain there. Echocardiogram reassuring, normal EF, no pericardial effusion.  7.  Hypokalemia. Resolved.  8.  Underweight/cachexia. Dietary consulted. Currently on regular diet.  Also on MVI.  Monitor. Body mass index is 15.39 kg/m.  Nutrition Problem: Severe Malnutrition Etiology: chronic illness (cirrhosis) Interventions: Interventions: MVI,Liberalize Diet,Refer to RD note for recommendations  9.  Right scalp injury. From her prior fall but it actually had mild oozing during her current fall as well. No further work-up recommended as there is no acute bleeding or swelling or acute abnormality on the MRI brain  with and without contrast.  10.  Dilated CBD. Incidentally seen on the CT scan.  Currently no evidence of acute abdominal pain or symptoms. Also LFTs are stable. No further work-up.  11.   Goals of care conversation. Patient has recurrent fall and has multiple comorbidities and appears to be severely cachectic. In the setting of cardiac arrest patient will not have good outcome.  Currently she is full code.  Previous hospitalist had a conversation with the family.  Diet: Regular diet DVT Prophylaxis:   Place and maintain sequential compression device Start: 03/04/21 1431    Advance goals of care discussion: Full code  Family Communication: Husband present at the bedside.  Disposition:  Status is: Inpatient  Remains inpatient appropriate because:Ongoing active pain requiring inpatient pain management and Ongoing diagnostic testing needed not appropriate for outpatient work up   Dispo: The patient is from: Home              Anticipated d/c is to: SNF              Patient currently is not medically stable to d/c.   Difficult to place patient No  Subjective: Patient seen and examined.  Eating breakfast in her bed.  She has no complaints.  No chest pain or shortness of breath. Physical Exam:  General exam: Appears calm and comfortable  Respiratory system: Clear to auscultation. Respiratory effort normal. Cardiovascular system: S1 & S2 heard, RRR. No JVD, murmurs, rubs, gallops or clicks. No pedal edema. Gastrointestinal system: Abdomen is nondistended, soft and nontender. No organomegaly or masses felt. Normal bowel sounds heard. Central nervous system: Alert and oriented. No focal neurological deficits. Extremities: Symmetric 5 x 5 power. Skin: No rashes, lesions or ulcers.  Psychiatry: Judgement and insight appear normal. Mood & affect appropriate.     Vitals:   03/08/21 0500 03/08/21 0754 03/08/21 0830 03/08/21 1153  BP: 109/72 92/64 106/63 107/76  Pulse: 89 91  81  Resp: 13 18  19   Temp: 98 F (36.7 C) 98 F (36.7 C)  98.9 F (37.2 C)  TempSrc: Oral Oral  Oral  SpO2: 94% 97%  97%  Weight:      Height:        Intake/Output Summary (Last 24 hours) at  03/08/2021 1338 Last data filed at 03/08/2021 0800 Gross per 24 hour  Intake 240 ml  Output 1550 ml  Net -1310 ml   Filed Weights   03/04/21 0325  Weight: 38.8 kg    Data Reviewed: I have personally reviewed and interpreted daily labs, tele strips, imaging. I reviewed all nursing notes, pharmacy notes, vitals, pertinent old records I have discussed plan of care as described above with RN and patient/family.  CBC: Recent Labs  Lab 03/03/21 1629 03/04/21 0532 03/06/21 0453 03/07/21 0306 03/08/21 0508  WBC 6.2 4.1 4.9 4.2 4.9  NEUTROABS 4.9  --  3.6 2.9 3.7  HGB 12.6 12.5 13.2 12.7 13.4  HCT 36.9 37.5 40.3 37.7 39.3  MCV 102.8* 103.9* 104.1* 101.6* 101.3*  PLT 225 227 288 286 696   Basic Metabolic Panel: Recent Labs  Lab 03/03/21 2034 03/03/21 2055 03/04/21 0532 03/06/21 0453 03/07/21 0306 03/08/21 0508  NA 140  --  144 140 139 140  K 4.0  --  3.4* 4.3 3.2* 4.1  CL 108  --  110 104 102 104  CO2 23  --  28 27 29 30   GLUCOSE 122*  --  118* 99 106* 106*  BUN 13  --  8 11 12 10   CREATININE 0.44 0.40* 0.43* 0.52 0.44 0.54  CALCIUM 8.6*  --  8.3* 9.1 8.6* 8.9  MG  --   --   --  2.2  --  2.0    Studies: No results found.  Scheduled Meds: . diltiazem  180 mg Oral QHS  . diltiazem  30 mg Oral BID  . docusate sodium  100 mg Oral BID  . dorzolamide  1 drop Both Eyes BID  . multivitamin with minerals  1 tablet Oral Daily  . polyethylene glycol  17 g Oral Daily   Continuous Infusions: . lactated ringers 100 mL/hr at 03/08/21 0624   PRN Meds: acetaminophen **OR** acetaminophen, HYDROmorphone (DILAUDID) injection, lidocaine (PF), ondansetron **OR** ondansetron (ZOFRAN) IV, senna-docusate, traMADol  Time spent: 28 minutes  Author: Darliss Cheney, MD [facility] 03/08/2021 1:38 PM  To reach On-call, see care teams to locate the attending and reach out via [email or web address]. Between 7PM-7AM, please contact night-coverage If you still have difficulty reaching the  attending provider, please page the [facility] (Director on Call) for [facility] on amion for assistance.

## 2021-03-08 NOTE — Progress Notes (Signed)
Physical Therapy Treatment Patient Details Name: Connie Chase MRN: [id number] DOB: [birth year] Today's Date: 03/08/2021    History of Present Illness Pt is a 75 y.o. female admitted 03/03/21 with SOB, multiple recent falls at home and difficulty ambulating. Workup revealed afib with RVR, hemopneuomothorax, L 5-10th rib fxs; subacute vs chronic lower xiphoid fx. CT C-spine, MRI brain, CT head unremarkable for any acute abnormality. S/p L thoracentesis 4/27. PMH includes viral Hep B, cirrhosis, afib, pre-diabetes, osteoporosis, cataract repair.   PT Comments    Pt progressing with mobility. Today's session focused on transfer training with RW, pt requiring up to [place]. Pt ambulating short distance, demonstrates shuffling/festinating (almost Parkinsonian-like) gait pattern with RW, requiring frequent min-modA for stability. Pt remains limited by generalized weakness, decreased activity tolerance, impaired balance strategies/postural reactions. Pt motivated to participate and regain PLOF; husband present and supportive. Feel pt would benefit from intensive CIR-level therapies to maximize functional mobility and independence prior to return home. Will reach out to CIR [facility] for screen.  Pt's telemetry monitor reading HR up to 175 -- HR taken via pulse ox probe and manually, reading HR 74 SpO2 95% on RA    Follow Up Recommendations  CIR;Supervision for mobility/OOB     Equipment Recommendations  3in1 (PT);Wheelchair (measurements PT);Wheelchair cushion (measurements PT) (if home)    Recommendations for Other Services       Precautions / Restrictions Precautions Precautions: Fall;Other (comment) Precaution Comments: Urinary/bowel frequency Restrictions Weight Bearing Restrictions: No    Mobility  Bed Mobility Overal bed mobility: Needs Assistance Bed Mobility: Supine to Sit     Supine to sit: Min assist;HOB elevated     General bed mobility comments: MinA for HHA to elevate trunk and scoot  hips to EOB    Transfers Overall transfer level: Needs assistance Equipment used: Rolling walker (2 wheeled) Transfers: Sit to/from Stand Sit to Stand: Mod assist;Min assist         General transfer comment: Initial modA to elevate trunk standing from EOB and BSC to RW, pt with difficulty maintaining knee flexion and anterior weight translation; 3x additional sit<>stands from recliner to RW, cues for hand placement to push into standing, pt progressing to minA for trunk elevation and stability; increased time widening BOS in order to maintain balance once standing  Ambulation/Gait Ambulation/Gait assistance: Min assist;Mod assist Gait Distance (Feet): 4 Feet     Gait velocity: Decreased   General Gait Details: Planned ambulation into bathroom, pt with very unsteady gait with RW and min-modA for stability, very short, shuffling (festinating? Parkinson's-like?) gait with very narrow BOS; able to minimally widen BOS with tactile/verbal cues, pt minimally increasing step length despite cues; when pt able to lengthen step, stability improved, but quick to return to shuffling gait; significant difficulty with turns; modA for RW management with turns; mod verbal cues for sequencing; further distance limited by need to urinate with rest on BSC, then fatigue   Stairs             Wheelchair Mobility    Modified Rankin (Stroke Patients Only)       Balance Overall balance assessment: Needs assistance Sitting-balance support: Feet supported;No upper extremity supported Sitting balance-Leahy Scale: Fair     Standing balance support: Bilateral upper extremity supported;During functional activity Standing balance-Leahy Scale: Poor Standing balance comment: Reliant on UE support and external assist; dependent for posterior pericare  Cognition Arousal/Alertness: Awake/alert Behavior During Therapy: WFL for tasks assessed/performed Overall  Cognitive Status: Impaired/Different from baseline Area of Impairment: Attention;Following commands;Safety/judgement;Awareness;Problem solving                   Current Attention Level: Sustained;Selective   Following Commands: Follows one step commands with increased time Safety/Judgement: Decreased awareness of deficits Awareness: Emergent Problem Solving: Difficulty sequencing;Requires verbal cues General Comments: Difficulty multitasking and difficulty sequencing with mobility; minimal verbalizations, but answering questions appropriately      Exercises Other Exercises Other Exercises: Instructed in 2 BUE exercises using level 1 theraband and provided hand squeeze ball to work on intrinsics. Handout provided detailing exercises. Spouse present and included in education    General Comments General comments (skin integrity, edema, etc.): Husband present and supportive, looking at SNF reviews. Pt's monitor reading HR up to 170; HR checked via portable pulse ox probe and manually on wrist, reading HR 74 (RN aware); SpO2 95% on RA      Pertinent Vitals/Pain Pain Assessment: Faces Faces Pain Scale: Hurts a little bit Pain Location: abdomen Pain Descriptors / Indicators: Discomfort Pain Intervention(s): Monitored during session    Home Living                      Prior Function            PT Goals (current goals can now be found in the care plan section) Acute Rehab PT Goals Patient Stated Goal: to walk and get up stairs Progress towards PT goals: Progressing toward goals    Frequency    Min 3X/week      PT Plan Discharge plan needs to be updated    Co-evaluation              AM-PAC PT "6 Clicks" Mobility   Outcome Measure  Help needed turning from your back to your side while in a flat bed without using bedrails?: A Little Help needed moving from lying on your back to sitting on the side of a flat bed without using bedrails?: A Lot Help  needed moving to and from a bed to a chair (including a wheelchair)?: A Lot Help needed standing up from a chair using your arms (e.g., wheelchair or bedside chair)?: A Lot Help needed to walk in hospital room?: A Lot Help needed climbing 3-5 steps with a railing? : A Lot 6 Click Score: 13    End of Session Equipment Utilized During Treatment: Gait belt Activity Tolerance: Patient tolerated treatment well;Patient limited by fatigue Patient left: in chair;with call bell/phone within reach;with family/visitor present;with nursing/sitter in room Nurse Communication: Mobility status PT Visit Diagnosis: Unsteadiness on feet (R26.81);Muscle weakness (generalized) (M62.81);Difficulty in walking, not elsewhere classified (R26.2);History of falling (Z91.81)     Time: 2423-5361 PT Time Calculation (min) (ACUTE ONLY): 25 min  Charges:  $Therapeutic Activity: 23-37 mins                     Mabeline Caras, PT, DPT Acute Rehabilitation Services  Pager [phone] Office [place] 03/08/2021, 1:51 PM

## 2021-03-08 NOTE — Progress Notes (Signed)
Inpatient Rehab Admissions Coordinator Note:   Per updated PT recommendations, pt was screened for CIR candidacy by Shann Medal, PT, DPT.  At this time we are recommending a CIR consult and I will place an order per our protocol.  Please contact me with questions.   Shann Medal, PT, DPT [phone] 03/08/21 2:58 PM

## 2021-03-08 NOTE — Progress Notes (Signed)
Hbe ab+ and HBV VL is undectable. Pt doesn't have home supply of entacavir here. Ok to hold while here and resume at discharge per Dr Doristine Bosworth.  Onnie Boer, PharmD, BCIDP, AAHIVP, CPP Infectious Disease Pharmacist 03/08/2021 8:28 AM

## 2021-03-08 NOTE — NC FL2 (Addendum)
[place] MEDICAID FL2 LEVEL OF CARE SCREENING TOOL     IDENTIFICATION  Patient Name: Connie Chase Birthdate: 18-Apr-1946 Sex: female Admission Date (Current Location): 03/03/2021  [facility] and [place] Number:  Herbalist and Address:  [facility], [place] [address], [place], [place] [address]      Provider Number: [id number]  Attending Physician Name and Address:  Darliss Cheney, MD  Relative Name and Phone Number:  Hattie Pine husband [phone]    Current Level of Care: SNF Recommended Level of Care: [place] Prior Approval Number:    Date Approved/Denied:   PASRR Number:  [id number]  Discharge Plan: SNF    Current Diagnoses: Patient Active Problem List   Diagnosis Date Noted  . Protein-calorie malnutrition, severe 03/05/2021  . Multiple rib fractures 03/04/2021  . Hemopneumothorax on left 03/04/2021  . Fall at home, initial encounter 03/04/2021  . Chronic anticoagulation 03/04/2021  . Parkinsonism ([place]) 06/14/2018  . Persistent atrial fibrillation ([place]) 03/27/2015  . Alopecia 02/09/2013  . Palpitations 06/13/2011  . Chest pain 06/13/2011  . ATRIAL FIBRILLATION 12/31/2009  . FATIGUE 12/31/2009    Orientation RESPIRATION BLADDER Height & Weight     Self,Time,Situation,Place  Normal Continent,External catheter Weight: 38.8 kg Height:  5' 2.5" (158.8 cm)  BEHAVIORAL SYMPTOMS/MOOD NEUROLOGICAL BOWEL NUTRITION STATUS     (n/a) Continent Diet (Regular diet)  AMBULATORY STATUS COMMUNICATION OF NEEDS Skin   Limited Assist (rolling walker min assist) Verbally Skin abrasions (arms, knee, leg, hip)                       Personal Care Assistance Level of Assistance  Bathing,Feeding,Dressing Bathing Assistance: Limited assistance Feeding assistance: Limited assistance (set up) Dressing Assistance: Limited assistance     Functional Limitations Info  Sight,Hearing,Speech Sight Info: Adequate Hearing Info:  Adequate Speech Info: Adequate    SPECIAL CARE FACTORS FREQUENCY  PT (By licensed PT),OT (By licensed OT)     PT Frequency: 5X OT Frequency: 5X            Contractures Contractures Info: Not present    Additional Factors Info  Code Status,Allergies,Psychotropic,Insulin Sliding Scale,Isolation Precautions,Suctioning Needs Code Status Info: Full Allergies Info: Fosamax Psychotropic Info: n/a Insulin Sliding Scale Info: see discharge summary for sliding scale information Isolation Precautions Info: n/a Suctioning Needs: n/a   Current Medications (03/08/2021):  This is the current hospital active medication list Current Facility-Administered Medications  Medication Dose Route Frequency Provider Last Rate Last Admin  . acetaminophen (TYLENOL) tablet 1,000 mg  1,000 mg Oral Q6H PRN Saverio Danker, PA-C       Or  . acetaminophen (TYLENOL) suppository 650 mg  650 mg Rectal Q6H PRN Saverio Danker, PA-C      . diltiazem (CARDIZEM CD) 24 hr capsule 180 mg  180 mg Oral QHS Darliss Cheney, MD   180 mg at 03/07/21 2113  . diltiazem (CARDIZEM) tablet 30 mg  30 mg Oral BID Lavina Hamman, MD   30 mg at 03/08/21 0830  . docusate sodium (COLACE) capsule 100 mg  100 mg Oral BID Lavina Hamman, MD   100 mg at 03/07/21 2113  . dorzolamide (TRUSOPT) 2 % ophthalmic solution 1 drop  1 drop Both Eyes BID Lavina Hamman, MD   1 drop at 03/07/21 2112  . HYDROmorphone (DILAUDID) injection 0.5 mg  0.5 mg Intravenous Q3H PRN Chotiner, Yevonne Aline, MD      . lactated ringers infusion  Intravenous Continuous Lavina Hamman, MD 100 mL/hr at 03/08/21 0624 New Bag at 03/08/21 3235  . lidocaine (PF) (XYLOCAINE) 1 % injection    PRN Ascencion Dike, PA-C   5 mL at 03/06/21 1521  . multivitamin with minerals tablet 1 tablet  1 tablet Oral Daily Lavina Hamman, MD   1 tablet at 03/06/21 [id number]  . ondansetron (ZOFRAN) tablet 4 mg  4 mg Oral Q6H PRN Chotiner, Yevonne Aline, MD       Or  . ondansetron (ZOFRAN) injection  4 mg  4 mg Intravenous Q6H PRN Chotiner, Yevonne Aline, MD      . polyethylene glycol (MIRALAX / GLYCOLAX) packet 17 g  17 g Oral Daily Lavina Hamman, MD   17 g at 03/07/21 2025  . senna-docusate (Senokot-S) tablet 1 tablet  1 tablet Oral QHS PRN Chotiner, Yevonne Aline, MD   1 tablet at 03/07/21 2113  . traMADol (ULTRAM) tablet 50 mg  50 mg Oral Q6H PRN Saverio Danker, PA-C         Discharge Medications: Please see discharge summary for a list of discharge medications.  Relevant Imaging Results:  Relevant Lab Results:   Additional Information SS# [id number]    Covid vaccine pfizer 09/08/20, 01/23/20, 12/31/19  Angelita Ingles, RN

## 2021-03-09 DIAGNOSIS — S2242XA Multiple fractures of ribs, left side, initial encounter for closed fracture: Secondary | ICD-10-CM | POA: Diagnosis not present

## 2021-03-09 LAB — SARS CORONAVIRUS 2 (TAT 6-24 HRS): SARS Coronavirus 2: NEGATIVE

## 2021-03-09 MED ORDER — DILTIAZEM HCL ER COATED BEADS 240 MG PO CP24
240.0000 mg | ORAL_CAPSULE | Freq: Every day | ORAL | Status: DC
  Administered 2021-03-09 – 2021-03-12 (×4): 240 mg via ORAL
  Filled 2021-03-09 (×4): qty 1

## 2021-03-09 NOTE — PMR Pre-admission (Shared)
PMR Admission Coordinator Pre-Admission Assessment  Patient: Connie Chase is an 75 y.o., female MRN: [id number] DOB: Jun 22, 1946 Height: 5' 2.5" (158.8 cm) Weight: 36.7 kg  Insurance Information HMO: ***    PPO: ***     PCP: ***     IPA: ***     80/20: ***     OTHER: *** PRIMARY: [employer] Medicare      Policy#: [id number]      Subscriber: *** CM Name: ***      Phone#: ***     Fax#: *** Pre-Cert#: [id number]      Employer: *** Benefits:  Phone #: ***     Name: *** Eff. Date: ***     Deduct: ***      Out of Pocket Max: ***      Life Max: *** CIR: ***      SNF: *** Outpatient: ***     Co-Pay: *** Home Health: ***      Co-Pay: *** DME: ***     Co-Pay: *** Providers: *** SECONDAR     Policy#:      Phone#:   Financial Counselor:       Phone#:   The "Data Collection Information Summary" for patients in Inpatient Rehabilitation Facilities with attached "Privacy Act [place] Records" was provided and verbally reviewed with: Patient and Family  Emergency Contact Information Contact Information    Name Relation Home Work [place] Spouse [phone]      Current Medical History  Patient Admitting Diagnosis: Debility History of Present Illness: Su-Hua Chenis a 75 y.o.femalewith Past medical history of chronic A. 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  Also has A. fib with RVR and severe constipation. CT C-spine, MRI brain, CT head unremarkable for any acute abnormality. Trauma surgery saw her and signed off. Status post left-sided thoracentesis with 600 cc removal on 03/06/2021. CIR consulted to assist in return to PLOF.    Patient's medical record from [facility]  has been reviewed by the rehabilitation  admission coordinator and physician.  Past Medical History  Past Medical History:  Diagnosis Date  . 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 Chronic viral hepatitis B without delta-agent ([place])   . Cirrhosis of liver ([place])   . Fibroid 2004, 2016  . Hemorrhage anterior chamber eye    On pradaxa  . Hepatitis B infection    Chronic Hep B carrier, on suppressive Tx  . Osteoarthritis        . Osteoporosis   . Osteoporosis   . Persistent atrial fibrillation (H[facility])    a. s/p DCCV b. intolerant of Flecainide  . Prediabetes     Family History   family history includes Osteoporosis in her mother; Other in an other family member.  Prior Rehab/Hospitalizations Has the patient had prior rehab or hospitalizations prior to admission? Yes  Has the patient had major surgery during 100 days prior to admission? Yes   Current Medications  Current Facility-Administered Medications:  .  acetaminophen (TYLENOL) tablet 1,000 mg, 1,000 mg, Oral, Q6H PRN **OR** acetaminophen (TYLENOL) suppository 650 mg, 650 mg, Rectal, Q6H PRN, Saverio Danker, PA-C .  diltiazem (CARDIZEM CD) 24 hr capsule 180 mg, 180 mg, Oral, QHS, Pahwani, Ravi, MD, 180 mg at 03/08/21 2137 .  diltiazem (CARDIZEM) tablet 30  mg, 30 mg, Oral, BID, Lavina Hamman, MD, 30 mg at 03/09/21 0908 .  docusate sodium (COLACE) capsule 100 mg, 100 mg, Oral, BID, Lavina Hamman, MD, 100 mg at 03/09/21 0908 .  dorzolamide (TRUSOPT) 2 % ophthalmic solution 1 drop, 1 drop, Both Eyes, BID, Lavina Hamman, MD, 1 drop at 03/08/21 1015 .  HYDROmorphone (DILAUDID) injection 0.5 mg, 0.5 mg, Intravenous, Q3H PRN, Chotiner, Yevonne Aline, MD .  lactated ringers infusion, , Intravenous, Continuous, Lavina Hamman, MD, Last Rate: 100 mL/hr at 03/09/21 0544, New Bag at 03/09/21 0544 .  lidocaine (PF) (XYLOCAINE) 1 % injection, , , PRN, Bruning, Kevin, PA-C, 5 mL at 03/06/21 1521 .  multivitamin with minerals tablet 1 tablet, 1 tablet, Oral, Daily, Lavina Hamman,  MD, 1 tablet at 03/09/21 0908 .  ondansetron (ZOFRAN) tablet 4 mg, 4 mg, Oral, Q6H PRN **OR** ondansetron (ZOFRAN) injection 4 mg, 4 mg, Intravenous, Q6H PRN, Chotiner, Yevonne Aline, MD .  polyethylene glycol (MIRALAX / GLYCOLAX) packet 17 g, 17 g, Oral, Daily, Lavina Hamman, MD, 17 g at 03/08/21 1015 .  senna-docusate (Senokot-S) tablet 1 tablet, 1 tablet, Oral, QHS PRN, Chotiner, Yevonne Aline, MD, 1 tablet at 03/07/21 2113 .  traMADol (ULTRAM) tablet 50 mg, 50 mg, Oral, Q6H PRN, Saverio Danker, PA-C  Patients Current Diet:  Diet Order            Diet regular Room service appropriate? Yes with Assist; Fluid consistency: Thin  Diet effective now                 Precautions / Restrictions Precautions Precautions: Fall,Other (comment) Precaution Comments: Urinary/bowel frequency Restrictions Weight Bearing Restrictions: No   Has the patient had 2 or more falls or a fall with injury in the past year? Yes  Prior Activity Level Limited Community (1-2x/wk): Pt.went out for errands and appointments  Prior Functional Level Self Care: Did the patient need help bathing, dressing, using the toilet or eating? Independent  Indoor Mobility: Did the patient need assistance with walking from room to room (with or without device)? Independent  Stairs: Did the patient need assistance with internal or external stairs (with or without device)? Needed some help  Functional Cognition: Did the patient need help planning regular tasks such as shopping or remembering to take medications? [place] / [place] Devices/Equipment: [place] Rile (specify type) Home Equipment: Walker - 2 wheels  Prior Device Use: Indicate devices/aids used by the patient prior to current illness, exacerbation or injury? None of the above  Current Functional Level Cognition  Overall Cognitive Status: Impaired/Different from baseline Current Attention Level:  Sustained,Selective Orientation Level: Oriented to place,Oriented to time,Oriented to person Following Commands: Follows one step commands with increased time Safety/Judgement: Decreased awareness of deficits General Comments: Difficulty multitasking and difficulty sequencing with mobility; minimal verbalizations, but answering questions appropriately    Extremity Assessment (includes Sensation/Coordination)  Upper Extremity Assessment: Generalized weakness  Lower Extremity Assessment: Defer to PT evaluation    ADLs  Overall ADL's : Needs assistance/impaired Eating/Feeding: Moderate assistance,Bed level Eating/Feeding Details (indicate cue type and reason): "spouse has been feeding me." OTR describes the importance of pt feeding herself. Grooming: Minimal assistance,Sitting,Bed level Upper Body Bathing: Minimal assistance,Sitting,Bed level Lower Body Bathing: Maximal assistance,Cueing for safety,Sitting/lateral leans,Sit to/from stand Upper Body Dressing : Minimal assistance,Sitting Lower Body Dressing: Minimal assistance,Sitting/lateral leans Toilet Transfer: Moderate assistance,Stand-pivot,Ambulation,RW Toilet Transfer Details (indicate cue type and reason): +2 assist for lines and difficulty  using RW properly as pt pushing it too far forward; pt requiring modA for slower descent. Toileting- Clothing Manipulation and Hygiene: Sitting/lateral lean,Sit to/from stand,Moderate assistance Toileting - Clothing Manipulation Details (indicate cue type and reason): pt standing at commode; performed own in sitting, but to be cleaned, pt required modA in standing Functional mobility during ADLs: Moderate assistance,+2 for physical assistance,+2 for safety/equipment,Rolling walker (+2 for lines) General ADL Comments: Pt recently back to bed after working with PT and sitting up in the recliner. Declined OOB activity. Focused on UB strengthening.    Mobility  Overal bed mobility: Needs  Assistance Bed Mobility: Supine to Sit Supine to sit: Min assist,HOB elevated Sit to supine: Mod assist General bed mobility comments: MinA for HHA to elevate trunk and scoot hips to EOB    Transfers  Overall transfer level: Needs assistance Equipment used: Rolling walker (2 wheeled) Transfers: Sit to/from Stand Sit to Stand: Mod assist,Min assist General transfer comment: Initial modA to elevate trunk standing from EOB and BSC to RW, pt with difficulty maintaining knee flexion and anterior weight translation; 3x additional sit<>stands from recliner to RW, cues for hand placement to push into standing, pt progressing to minA for trunk elevation and stability; increased time widening BOS in order to maintain balance once standing    Ambulation / Gait / Stairs / Wheelchair Mobility  Ambulation/Gait Ambulation/Gait assistance: Min assist,Mod assist Gait Distance (Feet): 4 Feet Assistive device: Rolling walker (2 wheeled) Gait Pattern/deviations: Step-to pattern,Decreased stride length,Wide base of support General Gait Details: Planned ambulation into bathroom, pt with very unsteady gait with RW and min-modA for stability, very short, shuffling (festinating? Parkinson's-like?) gait with very narrow BOS; able to minimally widen BOS with tactile/verbal cues, pt minimally increasing step length despite cues; when pt able to lengthen step, stability improved, but quick to return to shuffling gait; significant difficulty with turns; modA for RW management with turns; mod verbal cues for sequencing; further distance limited by need to urinate with rest on BSC, then fatigue Gait velocity: Decreased Gait velocity interpretation: <1.8 ft/sec, indicate of risk for recurrent falls Stairs: Yes Stairs assistance: Min assist Stair Management: One rail Right,One rail Left,Forwards,Backwards Number of Stairs: 2 (x 2) General stair comments: weakly stepping up but cannot do more than 2 steps    Posture /  Balance Dynamic Sitting Balance Sitting balance - Comments: Provided close supervision; sat EOB ~ 10 mins Balance Overall balance assessment: Needs assistance Sitting-balance support: Feet supported,No upper extremity supported Sitting balance-Leahy Scale: Fair Sitting balance - Comments: Provided close supervision; sat EOB ~ 10 mins Standing balance support: Bilateral upper extremity supported,During functional activity Standing balance-Leahy Scale: Poor Standing balance comment: Reliant on UE support and external assist; dependent for posterior pericare    Special needs/care consideration Skin *** and Special service needs ***   Previous Home Environment (from acute therapy documentation) Living Arrangements: Spouse/significant other  Lives With: Spouse Available Help at Discharge: Family Type of Home: House Home Layout: Multi-level Alternate Level Stairs-Rails: Right Alternate Level Stairs-Number of Steps: 14 Home Access: Level entry Entrance Stairs-Rails: Right Entrance Stairs-Number of Steps: 12 Bathroom Shower/Tub: Multimedia programmer: Standard Bathroom Accessibility: Yes How Accessible: Accessible via walker Home Care Services: No Additional Comments: husband asking for [facility]  Discharge Living Setting Plans for Discharge Living Setting: Patient's home Type of Home at Discharge: House Discharge Home Layout: One level Discharge Home Access: Stairs to enter Entrance Stairs-Rails: Right Entrance Stairs-Number of Steps: 12 Discharge Bathroom Shower/Tub: Walk-in shower Discharge  Bathroom Toilet: Standard Discharge Bathroom Accessibility: Yes How Accessible: Accessible via walker Does the patient have any problems obtaining your medications?: No  Social/Family/Support Systems Patient Roles: Spouse Contact Information: [phone] Anticipated Caregiver: Kylina Bauernfeind (husband) Anticipated Caregiver's Contact Information: [phone] Ability/Limitations of  Caregiver: Can provide 24/7 supervision to Min A Caregiver Availability: 24/7 Discharge Plan Discussed with Primary Caregiver: Yes Is Caregiver In Agreement with Plan?: Yes Does Caregiver/Family have Issues with Lodging/Transportation while Pt is in Rehab?: No  Goals Patient/Family Goal for Rehab: PT/OT Mod I Expected length of stay: 7-10 days Pt/Family Agrees to Admission and willing to participate: Yes Program Orientation Provided & Reviewed with Pt/Caregiver Including Roles  & Responsibilities: Yes  Decrease burden of Care through IP rehab admission: Specialzed equipment needs, Decrease number of caregivers and Patient/family education  Possible need for SNF placement upon discharge: not anticipated  Patient Condition: {PATIENT'S CONDITION:22832}  Preadmission Screen Completed By:  Genella Mech, 03/09/2021 11:11 AM ______________________________________________________________________   Discussed status with Dr. Marland Kitchen on *** at *** and received approval for admission today.  Admission Coordinator:  Genella Mech, CCC-SLP, time ***Sudie Grumbling ***   Assessment/Plan: Diagnosis: 1. Does the need for close, 24 hr/day Medical supervision in concert with the patient's rehab needs make it unreasonable for this patient to be served in a less intensive setting? {yes_no_potentially:3041433} 2. Co-Morbidities requiring supervision/potential complications: *** 3. Due to {due RE:257123, does the patient require 24 hr/day rehab nursing? {yes_no_potentially:3041433} 4. Does the patient require coordinated care of a physician, rehab nurse, PT, OT, and SLP to address physical and functional deficits in the context of the above medical diagnosis(es)? {yes_no_potentially:3041433} Addressing deficits in the following areas: {deficits:3041436} 5. Can the patient actively participate in an intensive therapy program of at least 3 hrs of therapy 5 days a week? {yes_no_potentially:3041433} 6. The potential for  patient to make measurable gains while on inpatient rehab is {potential:3041437} 7. Anticipated functional outcomes upon discharge from inpatient rehab: {functional outcomes:304600100} PT, {functional outcomes:304600100} OT, {functional outcomes:304600100} SLP 8. Estimated rehab length of stay to reach the above functional goals is: *** 9. Anticipated discharge destination: {anticipated dc setting:21604} 10. Overall Rehab/Functional Prognosis: {potential:3041437}   MD Signature: ***

## 2021-03-09 NOTE — Progress Notes (Signed)
[facility] Progress Note  Patient: Connie Chase    PPI:[id number]  DOA: 03/03/2021     Date of Service: the patient was seen and examined on 03/09/2021  Brief hospital course: Connie Chase is a 75 y.o. female with Past medical history of chronic A. 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  Also has A. fib with RVR and severe constipation. CT C-spine, MRI brain, CT head unremarkable for any acute abnormality. Trauma surgery saw her and signed off. Status post left-sided thoracentesis with 600 cc removal on 03/06/2021.  PT OT recommends SNF.  Patient's anticoagulation has been stopped since she has risk of more spontaneous bleeding due to recurrent fall.  Assessment and Plan: 1.  Recurrent fall. Patient reports gait disorder with recurrent fall, she tells that her legs give out. She has extensive evaluation including EMG done at [facility]. Neurology was consulted for further assistance CT C-spine, MRI brain, CT head unremarkable for any acute abnormality. Currently no further inpatient work-up, outpatient follow-up recommended.  Neurology signed off.  PT initially recommended SNF however they updated the recommendations and now recommend CIR.  GI on board.  2.  Right small apical pneumothorax. Left hemopneumothorax Left multiple rib fractures, acute, multiple other chronic rib fractures. Trauma surgery signed off.  Status post left-sided thoracentesis with 600 cc removal on 03/06/2021.  Patient has been on room air for last couple days now.  3.  Chronic A. fib with RVR Patient is on 30 mg Cardizem breakfast and lunch and 120 mg Cardizem CD at bedtime. She was supposed to be on Tikosyn but since she identified that she remained in A. fib despite being on Tikosyn she  stopped taking the medicine on her own somewhere in the beginning of 2022. Continues to take anticoagulation at home.  Patient has been having episodes of rapid heart rate which is going up to 140s and sometimes 150s however patient remains asymptomatic.  Her heart rate was ranging around 138 this morning when she was only eating her breakfast 03/07/2021.  Lengthy discussion with the husband where I recommended stopping short acting Cardizem and increasing her long-acting Cardizem CD to 240 mg.  Husband was very reluctant for me to make changes as he wanted patient's primary cardiologist to make the decisions.  Eventually he agreed for me to increase Cardizem CD to 180 mg daily and continue short acting Cardizem at lunch and at morning time.  Patient's A. fib was very well controlled for the last 36 hours however this morning again when I saw her, she was just eating her breakfast and her heart rate was at 160.  However she remained asymptomatic like always.  Further discussion with the husband took place.  We are going to increase Cardizem CD to 40 mg and continue short acting after husband agreed to this.  4.  Chronic anticoagulation with recurrent fall Anticoagulation is stopped due to history and risk of recurrent falls.  Will start on a low-dose aspirin once she is more stable.  5.  Severe constipation: Resolved.  6.  Sternal fracture.  Chronic appearing RVR. X-ray shows subacute versus chronic appearing lower xiphoid fracture as well. Patient does not have any symptoms of chest pain there. Echocardiogram reassuring, normal EF, no pericardial effusion.  7.  Hypokalemia. Resolved.  8.  Underweight/cachexia. Dietary consulted. Currently  on regular diet.  Also on MVI.  Monitor. Body mass index is 14.56 kg/m.  Nutrition Problem: Severe Malnutrition Etiology: chronic illness (cirrhosis) Interventions: Interventions: MVI,Liberalize Diet,Refer to RD note for recommendations  9.  Right scalp  injury. From her prior fall but it actually had mild oozing during her current fall as well. No further work-up recommended as there is no acute bleeding or swelling or acute abnormality on the MRI brain with and without contrast.  10.  Dilated CBD. Incidentally seen on the CT scan.  Currently no evidence of acute abdominal pain or symptoms. Also LFTs are stable. No further work-up.  11.  Goals of care conversation. Patient has recurrent fall and has multiple comorbidities and appears to be severely cachectic. In the setting of cardiac arrest patient will not have good outcome.  Currently she is full code.  Previous hospitalist had a conversation with the family.  Diet: Regular diet DVT Prophylaxis:   Place and maintain sequential compression device Start: 03/04/21 1431    Advance goals of care discussion: Full code  Family Communication: Husband present at the bedside.  Disposition:  Status is: Inpatient  Remains inpatient appropriate because:Ongoing active pain requiring inpatient pain management and Ongoing diagnostic testing needed not appropriate for outpatient work up   Dispo: The patient is from: Home              Anticipated d/c is to: SNF or CIR              Patient currently is not medically stable to d/c.   Difficult to place patient No  Subjective: Seen and examined.  Husband the bedside.  Patient has no complaints despite of having heart rate at 160.  Physical Exam:  General exam: Appears calm and comfortable, cachectic Respiratory system: Clear to auscultation. Respiratory effort normal. Cardiovascular system: S1 & S2 heard, irregularly irregular rate and rhythm. No JVD, murmurs, rubs, gallops or clicks. No pedal edema. Gastrointestinal system: Abdomen is nondistended, soft and nontender. No organomegaly or masses felt. Normal bowel sounds heard. Central nervous system: Alert and oriented. No focal neurological deficits. Extremities: Symmetric 5 x 5  power. Skin: No rashes, lesions or ulcers.  Psychiatry: Judgement and insight appear normal. Mood & affect appropriate.    Vitals:   03/08/21 2018 03/08/21 2359 03/09/21 0528 03/09/21 0849  BP: 124/84 (!) 120/30  118/69  Pulse: 90 87 99 (!) 107  Resp: 20 20  18   Temp: 99.1 F (37.3 C) 98.9 F (37.2 C) 98.9 F (37.2 C) 98.5 F (36.9 C)  TempSrc: Oral Oral Oral Oral  SpO2: 96% 95%  98%  Weight:   36.7 kg   Height:        Intake/Output Summary (Last 24 hours) at 03/09/2021 1116 Last data filed at 03/09/2021 0600 Gross per 24 hour  Intake --  Output 1850 ml  Net -1850 ml   Filed Weights   03/04/21 0325 03/09/21 0528  Weight: 38.8 kg 36.7 kg    Data Reviewed: I have personally reviewed and interpreted daily labs, tele strips, imaging. I reviewed all nursing notes, pharmacy notes, vitals, pertinent old records I have discussed plan of care as described above with RN and patient/family.  CBC: Recent Labs  Lab 03/03/21 1629 03/04/21 0532 03/06/21 0453 03/07/21 0306 03/08/21 0508  WBC 6.2 4.1 4.9 4.2 4.9  NEUTROABS 4.9  --  3.6 2.9 3.7  HGB 12.6 12.5 13.2 12.7 13.4  HCT 36.9 37.5 40.3 37.7 39.3  MCV 102.8*  103.9* 104.1* 101.6* 101.3*  PLT 225 227 288 286 245   Basic Metabolic Panel: Recent Labs  Lab 03/03/21 2034 03/03/21 2055 03/04/21 0532 03/06/21 0453 03/07/21 0306 03/08/21 0508  NA 140  --  144 140 139 140  K 4.0  --  3.4* 4.3 3.2* 4.1  CL 108  --  110 104 102 104  CO2 23  --  28 27 29 30   GLUCOSE 122*  --  118* 99 106* 106*  BUN 13  --  8 11 12 10   CREATININE 0.44 0.40* 0.43* 0.52 0.44 0.54  CALCIUM 8.6*  --  8.3* 9.1 8.6* 8.9  MG  --   --   --  2.2  --  2.0    Studies: No results found.  Scheduled Meds: . diltiazem  240 mg Oral QHS  . diltiazem  30 mg Oral BID  . docusate sodium  100 mg Oral BID  . dorzolamide  1 drop Both Eyes BID  . multivitamin with minerals  1 tablet Oral Daily  . polyethylene glycol  17 g Oral Daily   Continuous  Infusions: . lactated ringers 100 mL/hr at 03/09/21 0544   PRN Meds: acetaminophen **OR** acetaminophen, HYDROmorphone (DILAUDID) injection, lidocaine (PF), ondansetron **OR** ondansetron (ZOFRAN) IV, senna-docusate, traMADol  Time spent: 29 minutes  Author: Darliss Cheney, MD [facility] 03/09/2021 11:16 AM  To reach On-call, see care teams to locate the attending and reach out via [email or web address]. Between 7PM-7AM, please contact night-coverage If you still have difficulty reaching the attending provider, please page the [facility] (Director on Call) for [facility] on amion for assistance.

## 2021-03-09 NOTE — Progress Notes (Signed)
Inpatient Rehab Admissions Coordinator:   I spoke with Pt. And husband over the phone regarding potential CIR admission. Pt. Stated interest and stated they would prefer CIR but will consider SNF for short term rehab if insurance does not approve CIR. I will open a case with [employer] medicare over the weekend for potential admit this week.   Clemens Catholic, [place], [place] Admissions Coordinator  [phone] ([place]) [phone] (office)

## 2021-03-10 DIAGNOSIS — S2242XA Multiple fractures of ribs, left side, initial encounter for closed fracture: Secondary | ICD-10-CM | POA: Diagnosis not present

## 2021-03-10 LAB — BASIC METABOLIC PANEL
Anion gap: 6 (ref 5–15)
BUN: 11 mg/dL (ref 8–23)
CO2: 30 mmol/L (ref 22–32)
Calcium: 8.5 mg/dL — ABNORMAL LOW (ref 8.9–10.3)
Chloride: 106 mmol/L (ref 98–111)
Creatinine, Ser: 0.55 mg/dL (ref 0.44–1.00)
GFR, Estimated: >60 mL/min (ref >60)
Glucose, Bld: 103 mg/dL — ABNORMAL HIGH (ref 70–99)
Potassium: 3.7 mmol/L (ref 3.5–5.1)
Sodium: 142 mmol/L (ref 135–145)

## 2021-03-10 LAB — MAGNESIUM: Magnesium: 2.3 mg/dL (ref 1.7–2.4)

## 2021-03-10 NOTE — Progress Notes (Signed)
OT Cancellation Note  Patient Details Name: Connie Chase MRN: [id number] DOB: [birth year]   Cancelled Treatment:    Reason Eval/Treat Not Completed: Pt deferred stating she wants to wait until later and until spouse arrives.  Will reattempt as schedule allows.  [name]., OTR/L Acute Rehabilitation Services Pager [phone] Office [phone]   [name] 03/10/2021, 7:58 AM

## 2021-03-10 NOTE — Progress Notes (Signed)
Pt HR elev in 140-175's nurse notified Darliss Cheney, MD no new orders given nurse will cont. To monitor.

## 2021-03-10 NOTE — Plan of Care (Signed)
  Problem: Education: Goal: Knowledge of General Education information will improve Description Including pain rating scale, medication(s)/side effects and non-pharmacologic comfort measures Outcome: Progressing   

## 2021-03-10 NOTE — Progress Notes (Signed)
[facility] Progress Note  Patient: Connie Chase    BPZ:[id number]  DOA: 03/03/2021     Date of Service: the patient was seen and examined on 03/10/2021  Brief hospital course: Connie Chase is a 75 y.o. female with Past medical history of chronic A. 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  Also has A. fib with RVR and severe constipation. CT C-spine, MRI brain, CT head unremarkable for any acute abnormality. Trauma surgery saw her and signed off. Status post left-sided thoracentesis with 600 cc removal on 03/06/2021.  PT OT recommends SNF.  Patient's anticoagulation has been stopped since she has risk of more spontaneous bleeding due to recurrent fall.  Assessment and Plan: 1.  Recurrent fall. Patient reports gait disorder with recurrent fall, she tells that her legs give out. She has extensive evaluation including EMG done at [facility]. Neurology was consulted for further assistance CT C-spine, MRI brain, CT head unremarkable for any acute abnormality. Currently no further inpatient work-up, outpatient follow-up recommended.  Neurology signed off.  PT initially recommended SNF however they updated the recommendations and now recommend CIR. CIR on board.  Pending insurance authorization.  2.  Right small apical pneumothorax. Left hemopneumothorax Left multiple rib fractures, acute, multiple other chronic rib fractures. Trauma surgery signed off.  Status post left-sided thoracentesis with 600 cc removal on 03/06/2021.  Patient has been on room air for last couple days now.  3.  Chronic A. fib with RVR Patient is on 30 mg Cardizem breakfast and lunch and 120 mg Cardizem CD at bedtime. She was supposed to be on Tikosyn but since she identified that she remained in A.  fib despite being on Tikosyn she stopped taking the medicine on her own somewhere in the beginning of 2022. Continues to take anticoagulation at home.  Cardizem CD increased to 40 mg nightly on 03/09/2021 and continues to be on short acting Cardizem 30 mg twice a day.  Heart rate finally controlled.  Continue current regimen.  4.  Chronic anticoagulation with recurrent fall Anticoagulation is stopped due to history and risk of recurrent falls.  Will start on a low-dose aspirin once she is more stable.  5.  Severe constipation: Resolved.  6.  Sternal fracture.  Chronic appearing RVR. X-ray shows subacute versus chronic appearing lower xiphoid fracture as well. Patient does not have any symptoms of chest pain there. Echocardiogram reassuring, normal EF, no pericardial effusion.  7.  Hypokalemia. Resolved.  8.  Underweight/cachexia. Dietary consulted. Currently on regular diet.  Also on MVI.  Monitor. Body mass index is 14.56 kg/m.  Nutrition Problem: Severe Malnutrition Etiology: chronic illness (cirrhosis) Interventions: Interventions: MVI,Liberalize Diet,Refer to RD note for recommendations  9.  Right scalp injury. From her prior fall but it actually had mild oozing during her current fall as well. No further work-up recommended as there is no acute bleeding or swelling or acute abnormality on the MRI brain with and without contrast.  10.  Dilated CBD. Incidentally seen on the CT scan.  Currently no evidence of acute abdominal pain or symptoms. Also LFTs are stable. No further work-up.  11.  Goals of care conversation. Patient has recurrent fall and has multiple comorbidities and appears to be severely cachectic. In the setting of cardiac arrest patient will not have good outcome.  Currently she  is full code.  Previous hospitalist had a conversation with the family.  Diet: Regular diet DVT Prophylaxis:   Place and maintain sequential compression device Start: 03/04/21 1431     Advance goals of care discussion: Full code  Family Communication: Husband present at the bedside.  Disposition:  Status is: Inpatient  Remains inpatient appropriate because:Ongoing active pain requiring inpatient pain management and Ongoing diagnostic testing needed not appropriate for outpatient work up   Dispo: The patient is from: Home              Anticipated d/c is to: SNF or CIR              Patient currently is not medically stable to d/c.   Difficult to place patient No  Subjective: Seen and examined.  Husband the bedside.  She has no complaints.  Denies shortness of breath.  Physical Exam:  General exam: Appears calm and comfortable  Respiratory system: Clear to auscultation. Respiratory effort normal. Cardiovascular system: S1 & S2 heard, irregularly irregular rate and rhythm, no JVD, murmurs, rubs, gallops or clicks. No pedal edema. Gastrointestinal system: Abdomen is nondistended, soft and nontender. No organomegaly or masses felt. Normal bowel sounds heard. Central nervous system: Alert and oriented. No focal neurological deficits. Extremities: Symmetric 5 x 5 power. Skin: No rashes, lesions or ulcers.  Psychiatry: Judgement and insight appear normal. Mood & affect appropriate.    Vitals:   03/10/21 0200 03/10/21 0400 03/10/21 0600 03/10/21 0758  BP: 97/60 104/67 102/66 101/72  Pulse: 82 90 77 89  Resp: 14  15 17   Temp:    98.3 F (36.8 C)  TempSrc:    Oral  SpO2: 95% 96% 94% 97%  Weight:      Height:        Intake/Output Summary (Last 24 hours) at 03/10/2021 1128 Last data filed at 03/10/2021 0501 Gross per 24 hour  Intake --  Output 2550 ml  Net -2550 ml   Filed Weights   03/04/21 0325 03/09/21 0528  Weight: 38.8 kg 36.7 kg    Data Reviewed: I have personally reviewed and interpreted daily labs, tele strips, imaging. I reviewed all nursing notes, pharmacy notes, vitals, pertinent old records I have discussed plan of care as described above with  RN and patient/family.  CBC: Recent Labs  Lab 03/03/21 1629 03/04/21 0532 03/06/21 0453 03/07/21 0306 03/08/21 0508  WBC 6.2 4.1 4.9 4.2 4.9  NEUTROABS 4.9  --  3.6 2.9 3.7  HGB 12.6 12.5 13.2 12.7 13.4  HCT 36.9 37.5 40.3 37.7 39.3  MCV 102.8* 103.9* 104.1* 101.6* 101.3*  PLT 225 227 288 286 563   Basic Metabolic Panel: Recent Labs  Lab 03/04/21 0532 03/06/21 0453 03/07/21 0306 03/08/21 0508 03/10/21 0537  NA 144 140 139 140 142  K 3.4* 4.3 3.2* 4.1 3.7  CL 110 104 102 104 106  CO2 28 27 29 30 30   GLUCOSE 118* 99 106* 106* 103*  BUN 8 11 12 10 11   CREATININE 0.43* 0.52 0.44 0.54 0.55  CALCIUM 8.3* 9.1 8.6* 8.9 8.5*  MG  --  2.2  --  2.0 2.3    Studies: No results found.  Scheduled Meds: . diltiazem  240 mg Oral QHS  . diltiazem  30 mg Oral BID  . docusate sodium  100 mg Oral BID  . dorzolamide  1 drop Both Eyes BID  . multivitamin with minerals  1 tablet Oral Daily  . polyethylene glycol  17  g Oral Daily   Continuous Infusions:  PRN Meds: acetaminophen **OR** acetaminophen, HYDROmorphone (DILAUDID) injection, lidocaine (PF), ondansetron **OR** ondansetron (ZOFRAN) IV, senna-docusate, traMADol  Time spent: 27 minutes  Author: Darliss Cheney, MD [facility] 03/10/2021 11:28 AM  To reach On-call, see care teams to locate the attending and reach out via [email or web address]. Between 7PM-7AM, please contact night-coverage If you still have difficulty reaching the attending provider, please page the [facility] (Director on Call) for [facility] on amion for assistance.

## 2021-03-10 NOTE — Progress Notes (Signed)
OT Cancellation Note  Patient Details Name: Connie Chase MRN: [id number] DOB: 18-Apr-1946   Cancelled Treatment:    Reason Eval/Treat Not Completed: Medical issues which prohibited therapy.  Elevated HR.  Connie Nutting., OTR/L Acute Rehabilitation Services Pager [phone] Office [phone]   Connie Chase 03/10/2021, 2:34 PM

## 2021-03-11 DIAGNOSIS — S2242XA Multiple fractures of ribs, left side, initial encounter for closed fracture: Secondary | ICD-10-CM | POA: Diagnosis not present

## 2021-03-11 NOTE — Progress Notes (Signed)
Inpatient Rehabilitation Admissions Coordinator  I met with patient and her spouse at bedside. We discussed goals, expectations and cost of care of a possible CIR admit. Insurance case was opened Saturday 4/30 and we await their determination for a possible CIR admit. Spouse states that if insurance denies CIR, he would like to purse appeal which we would assist with.  Danne Baxter, RN, MSN Rehab Admissions Coordinator [phone] 03/11/2021 11:05 AM

## 2021-03-11 NOTE — Plan of Care (Signed)
  Problem: Education: Goal: Knowledge of General Education information will improve Description Including pain rating scale, medication(s)/side effects and non-pharmacologic comfort measures Outcome: Progressing   

## 2021-03-11 NOTE — Progress Notes (Signed)
[facility] Progress Note  Patient: Connie Chase    XTG:[id number]  DOA: 03/03/2021     Date of Service: the patient was seen and examined on 03/11/2021  Brief hospital course: Su-Hua Camarena is a 75 y.o. female with Past medical history of chronic A. 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  Also has A. fib with RVR and severe constipation. CT C-spine, MRI brain, CT head unremarkable for any acute abnormality. Trauma surgery saw her and signed off. Status post left-sided thoracentesis with 600 cc removal on 03/06/2021.  PT OT recommends SNF.  Patient's anticoagulation has been stopped since she has risk of more spontaneous bleeding due to recurrent fall.  Assessment and Plan: 1.  Recurrent fall. Patient reports gait disorder with recurrent fall, she tells that her legs give out. She has extensive evaluation including EMG done at [facility]. Neurology was consulted for further assistance CT C-spine, MRI brain, CT head unremarkable for any acute abnormality. Currently no further inpatient work-up, outpatient follow-up recommended.  Neurology signed off.  PT initially recommended SNF however they updated the recommendations and now recommend CIR. CIR on board.  Pending insurance authorization.  2.  Right small apical pneumothorax. Left hemopneumothorax Left multiple rib fractures, acute, multiple other chronic rib fractures. Trauma surgery signed off.  Status post left-sided thoracentesis with 600 cc removal on 03/06/2021.  Patient was on room air for last couple of days but dropped to 80% on room air this morning requiring 2 L of oxygen.  According to her husband, she dropped it because she was eating breakfast.  We we will try to wean her off to room air after she finishes  eating.  3.  Chronic A. fib with RVR Patient is on 30 mg Cardizem breakfast and lunch and 120 mg Cardizem CD at bedtime. She was supposed to be on Tikosyn but since she identified that she remained in A. fib despite being on Tikosyn she stopped taking the medicine on her own somewhere in the beginning of 2022. Continues to take anticoagulation at home.  Cardizem CD increased to 240 mg nightly on 03/09/2021 and continues to be on short acting Cardizem 30 mg twice a day.  Heart rate finally controlled.  Continue current regimen.  4.  Chronic anticoagulation with recurrent fall Anticoagulation is stopped due to history and risk of recurrent falls.  Will start on a low-dose aspirin once she is more stable.  5.  Severe constipation: Resolved.  6.  Sternal fracture.  Chronic appearing RVR. X-ray shows subacute versus chronic appearing lower xiphoid fracture as well. Patient does not have any symptoms of chest pain there. Echocardiogram reassuring, normal EF, no pericardial effusion.  7.  Hypokalemia. Resolved.  8.  Underweight/cachexia. Dietary consulted. Currently on regular diet.  Also on MVI.  Monitor. Body mass index is 14.56 kg/m.  Nutrition Problem: Severe Malnutrition Etiology: chronic illness (cirrhosis) Interventions: Interventions: MVI,Liberalize Diet,Refer to RD note for recommendations  9.  Right scalp injury. From her prior fall but it actually had mild oozing during her current fall as well. No further work-up recommended as there is no acute bleeding or swelling or acute abnormality on the MRI brain with and without contrast.  10.  Dilated CBD. Incidentally seen on the CT scan.  Currently no evidence of acute abdominal pain or symptoms. Also  LFTs are stable. No further work-up.  11.  Goals of care conversation. Patient has recurrent fall and has multiple comorbidities and appears to be severely cachectic. In the setting of cardiac arrest patient will not have good  outcome.  Currently she is full code.  Previous hospitalist had a conversation with the family.  Diet: Regular diet DVT Prophylaxis:   Place and maintain sequential compression device Start: 03/04/21 1431    Advance goals of care discussion: Full code  Family Communication: Husband present at the bedside.  Disposition:  Status is: Inpatient  Remains inpatient appropriate because:Ongoing active pain requiring inpatient pain management and Ongoing diagnostic testing needed not appropriate for outpatient work up   Dispo: The patient is from: Home              Anticipated d/c is to: SNF or CIR              Patient currently is not medically stable to d/c.   Difficult to place patient No  Subjective: Seen and examined.  Husband the bedside.  She is eating breakfast.  She denies any chest pain or shortness of breath.  For the first time today, while eating breakfast, her heart rate was 98.  Physical Exam:  General exam: Appears calm and comfortable, cachectic Respiratory system: Clear to auscultation. Respiratory effort normal. Cardiovascular system: S1 & S2 heard, irregularly irregular rate and rhythm. No JVD, murmurs, rubs, gallops or clicks. No pedal edema. Gastrointestinal system: Abdomen is nondistended, soft and nontender. No organomegaly or masses felt. Normal bowel sounds heard. Central nervous system: Alert and oriented. No focal neurological deficits. Extremities: Symmetric 5 x 5 power. Skin: No rashes, lesions or ulcers.  Psychiatry: Judgement and insight appear normal. Mood & affect appropriate.   Vitals:   03/10/21 2127 03/11/21 0318 03/11/21 0415 03/11/21 0804  BP: 121/69 (!) 98/53 113/68 106/71  Pulse:  73 70 80  Resp:  16 18 20   Temp:  98.5 F (36.9 C) 98 F (36.7 C) 98.6 F (37 C)  TempSrc:  Oral Oral Oral  SpO2:  95% 95% 99%  Weight:      Height:        Intake/Output Summary (Last 24 hours) at 03/11/2021 1035 Last data filed at 03/11/2021 0500 Gross per 24  hour  Intake --  Output 450 ml  Net -450 ml   Filed Weights   03/04/21 0325 03/09/21 0528  Weight: 38.8 kg 36.7 kg    Data Reviewed: I have personally reviewed and interpreted daily labs, tele strips, imaging. I reviewed all nursing notes, pharmacy notes, vitals, pertinent old records I have discussed plan of care as described above with RN and patient/family.  CBC: Recent Labs  Lab 03/06/21 0453 03/07/21 0306 03/08/21 0508  WBC 4.9 4.2 4.9  NEUTROABS 3.6 2.9 3.7  HGB 13.2 12.7 13.4  HCT 40.3 37.7 39.3  MCV 104.1* 101.6* 101.3*  PLT 288 286 850   Basic Metabolic Panel: Recent Labs  Lab 03/06/21 0453 03/07/21 0306 03/08/21 0508 03/10/21 0537  NA 140 139 140 142  K 4.3 3.2* 4.1 3.7  CL 104 102 104 106  CO2 27 29 30 30   GLUCOSE 99 106* 106* 103*  BUN 11 12 10 11   CREATININE 0.52 0.44 0.54 0.55  CALCIUM 9.1 8.6* 8.9 8.5*  MG 2.2  --  2.0 2.3    Studies: No results found.  Scheduled Meds: . diltiazem  240 mg Oral QHS  . diltiazem  30 mg  Oral BID  . docusate sodium  100 mg Oral BID  . dorzolamide  1 drop Both Eyes BID  . multivitamin with minerals  1 tablet Oral Daily  . polyethylene glycol  17 g Oral Daily   Continuous Infusions:  PRN Meds: acetaminophen **OR** acetaminophen, HYDROmorphone (DILAUDID) injection, lidocaine (PF), ondansetron **OR** ondansetron (ZOFRAN) IV, senna-docusate, traMADol  Time spent: 26 minutes  Author: Darliss Cheney, MD [facility] 03/11/2021 10:35 AM  To reach On-call, see care teams to locate the attending and reach out via [email or web address]. Between 7PM-7AM, please contact night-coverage If you still have difficulty reaching the attending provider, please page the [facility] (Director on Call) for [facility] on amion for assistance.

## 2021-03-11 NOTE — Progress Notes (Signed)
Physical Therapy Treatment Patient Details Name: Connie Chase MRN: [id number] DOB: [birth year] Today's Date: 03/11/2021    History of Present Illness Pt is a 75 y.o. female admitted 03/03/21 with SOB, multiple recent falls at home and difficulty ambulating. Workup revealed afib with RVR, hemopneuomothorax, L 5-10th rib fxs; subacute vs chronic lower xiphoid fx. CT C-spine, MRI brain, CT head unremarkable for any acute abnormality. S/p L thoracentesis 4/27. PMH includes viral Hep B, cirrhosis, afib, pre-diabetes, osteoporosis, cataract repair.    PT Comments    Pt was seen for mobility on side of bed with effort to move but HR was a limiting factor.  Had initial numbers at 110 level and after standing popped up to 154.  Sat and rate lowered significantly, but upon return to standing with short sidesteps had a sudden run to 174 and returned to bed.  Pt is asking about using BR via walking there and explained why this was not practical currently.  Follow along with her for acute PT goals, and note her level of assistance is less than last visit certainly.  Continue to work with pt as her cardiac values allow.  Follow Up Recommendations  CIR     Equipment Recommendations  3in1 (PT);Wheelchair (measurements PT);Wheelchair cushion (measurements PT)    Recommendations for Other Services       Precautions / Restrictions Precautions Precautions: Fall;Other (comment) Precaution Comments: Urinary/bowel frequency Restrictions Weight Bearing Restrictions: No    Mobility  Bed Mobility Overal bed mobility: Needs Assistance Bed Mobility: Supine to Sit;Sit to Supine     Supine to sit: Min assist Sit to supine: Min assist;Mod assist   General bed mobility comments: min mod for leaning trunk and controlling her legs to lift onto bed    Transfers Overall transfer level: Needs assistance Equipment used: Rolling walker (2 wheeled) Transfers: Sit to/from Stand Sit to Stand: Min assist          General transfer comment: min assist to power up and control balance on initial standing  Ambulation/Gait Ambulation/Gait assistance: Min assist Gait Distance (Feet): 5 Feet Assistive device: Rolling walker (2 wheeled) Gait Pattern/deviations: Step-to pattern;Decreased stride length;Wide base of support Gait velocity: Decreased Gait velocity interpretation: <1.31 ft/sec, indicative of household ambulator General Gait Details: pt had elevation of HR with initial standing to 154, then rested and dropped, did sidestepping on the side of bed and was 174, stopped and returned to bed   Stairs             Wheelchair Mobility    Modified Rankin (Stroke Patients Only)       Balance Overall balance assessment: Needs assistance Sitting-balance support: Feet supported Sitting balance-Leahy Scale: Fair     Standing balance support: Bilateral upper extremity supported;During functional activity Standing balance-Leahy Scale: Poor                              Cognition Arousal/Alertness: Awake/alert Behavior During Therapy: WFL for tasks assessed/performed Overall Cognitive Status: Impaired/Different from baseline Area of Impairment: Safety/judgement;Following commands;Attention;Awareness;Problem solving;Orientation;Memory                 Orientation Level: Place;Time;Situation Current Attention Level: Selective Memory: Decreased recall of precautions Following Commands: Follows one step commands inconsistently;Follows one step commands with increased time Safety/Judgement: Decreased awareness of safety;Decreased awareness of deficits Awareness: Intellectual Problem Solving: Slow processing;Decreased initiation;Difficulty sequencing;Requires verbal cues;Requires tactile cues General Comments: struggle to remember reasons for PT limiting her  movement      Exercises      General Comments General comments (skin integrity, edema, etc.): pt was assisted to  stand and step on side of bed with resulting HR elevated excessively. Pt asking to walk to BR but explained about her HR limiting movement      Pertinent Vitals/Pain Pain Assessment: No/denies pain    Home Living                      Prior Function            PT Goals (current goals can now be found in the care plan section) Acute Rehab PT Goals Patient Stated Goal: get up the staircase to get home Progress towards PT goals: Progressing toward goals    Frequency    Min 3X/week      PT Plan Current plan remains appropriate    Co-evaluation              AM-PAC PT "6 Clicks" Mobility   Outcome Measure  Help needed turning from your back to your side while in a flat bed without using bedrails?: A Little Help needed moving from lying on your back to sitting on the side of a flat bed without using bedrails?: A Little Help needed moving to and from a bed to a chair (including a wheelchair)?: A Little Help needed standing up from a chair using your arms (e.g., wheelchair or bedside chair)?: A Little Help needed to walk in hospital room?: A Little Help needed climbing 3-5 steps with a railing? : Total 6 Click Score: 16    End of Session Equipment Utilized During Treatment: Gait belt Activity Tolerance: Patient tolerated treatment well;Patient limited by fatigue Patient left: in bed;with call bell/phone within reach;with bed alarm set Nurse Communication: Mobility status PT Visit Diagnosis: Unsteadiness on feet (R26.81);Muscle weakness (generalized) (M62.81);Difficulty in walking, not elsewhere classified (R26.2);History of falling (Z91.81)     Time: 2426-8341 PT Time Calculation (min) (ACUTE ONLY): 26 min  Charges:  $Gait Training: 8-22 mins $Therapeutic Activity: 8-22 mins                    Ramond Dial 03/11/2021, 3:16 PM Mee Hives, PT MS Acute Rehab Dept. Number: [place]

## 2021-03-11 NOTE — Progress Notes (Signed)
Nurse had to put O2, 2L [place] on pt while eating because pt desat's  In the 70's.

## 2021-03-11 NOTE — Progress Notes (Addendum)
OT Cancellation Note  Patient Details Name: Connie Chase MRN: [id number] DOB: 1946/06/17   Cancelled Treatment:    Reason Eval/Treat Not Completed: Medical issues which prohibited therapy (HR continues to increase to 120s-130s at rest. OT to hold at this time.) OT following.  OT satw at 10am, but pt wanting to take her medications (eye care) taken care of prior to working with OT. Pt checked at 12 pm and pt was tachy.   Jefferey Pica, OTR/L Acute Rehabilitation Services Pager: [phone] Office: [phone]   Jefferey Pica 03/11/2021, 1:34 PM

## 2021-03-12 ENCOUNTER — Other Ambulatory Visit (HOSPITAL_COMMUNITY): Payer: Self-pay

## 2021-03-12 DIAGNOSIS — S2242XA Multiple fractures of ribs, left side, initial encounter for closed fracture: Secondary | ICD-10-CM | POA: Diagnosis not present

## 2021-03-12 LAB — MAGNESIUM: Magnesium: 2.5 mg/dL — ABNORMAL HIGH (ref 1.7–2.4)

## 2021-03-12 LAB — RESP PANEL BY RT-PCR (FLU A&B, COVID) ARPGX2
Influenza A by PCR: NEGATIVE
Influenza B by PCR: NEGATIVE
SARS Coronavirus 2 by RT PCR: NEGATIVE

## 2021-03-12 MED ORDER — DILTIAZEM HCL ER COATED BEADS 240 MG PO CP24
240.0000 mg | ORAL_CAPSULE | Freq: Every day | ORAL | 0 refills | Status: AC
  Filled 2021-03-12: qty 30, 30d supply, fill #0

## 2021-03-12 NOTE — Progress Notes (Signed)
Inpatient Rehab Admissions Coordinator:   I received a denial for CIR from Pt.'s insurance. I spoke with pt,. And her husband and Pt. Does not want to appeal. They would like to purse SNF at [facility]. TOC and MD aware. CIR will sign off.   Clemens Catholic, [place], [place] Admissions Coordinator  [phone] ([place]) [phone] (office)

## 2021-03-12 NOTE — Plan of Care (Signed)

## 2021-03-12 NOTE — Progress Notes (Signed)
Pt was found with medicine on table not administered by this nurse. When asked about it, pt husband stated he gave her medicine. Asked pt husband please do not give medicine as we need to have orders and be aware of what patient is taking. Pt husband states he knows what is best for pt. Patient advised. Reported to staff to make aware in case any additional; meds noted. Staff reports yesterday pt family was also involved in patient med needs as they stated pt needed Cardizem. Pt family also reported giving patient eye drops.  Communication provided to MD to make aware.

## 2021-03-12 NOTE — Progress Notes (Signed)
Pt pulled off monitor more than once. Pt refused to continue to wear monitor. States doesn't need it anymore. Pt also started taking off pt bracelets, asked pt to leave on.

## 2021-03-12 NOTE — Progress Notes (Addendum)
Called Erica from [place] given. Called husband made aware that staff reported that husband was to head to [facility] at this time.

## 2021-03-12 NOTE — Discharge Instructions (Signed)
Hemothorax  Hemothorax is a buildup of blood in the space between the lungs and the chest wall (pleural cavity). This can cause trouble breathing, a collapsed lung (pneumothorax), and other dangerous problems. This condition is a medical emergency that must be treated right away in a hospital. What are the causes? This condition is most often caused by an injury (trauma) that causes a tear in a lung or in a blood vessel in the chest. Other possible causes include:  Tuberculosis.  An injury caused by placing a tube into a blood vessel in the chest (central venous catheter).  Cancer in the chest.  A problem with how your blood clots.  Blood thinner (anticoagulant) medicines.  Lung surgery or heart surgery. What are the signs or symptoms? Signs and symptoms may include:  Rapid breathing.  Difficulty breathing.  Shortness of breath.  Feeling light-headed.  Anxiety.  Restlessness.  A rapid heart rate.  Low blood pressure (hypotension).  Chest pain.  Skin that is cool, sweaty, pale, or blue. How is this diagnosed? This condition may be diagnosed based on:  Your symptoms and medical history. Your health care provider may ask about any recent injuries you have had.  A physical exam.  A chest X-ray.  Removal and testing of a blood sample from the pleural cavity. How is this treated? This condition must be treated at the hospital. Treatment may include:  A procedure to place a small tube into the pleural cavity (chest tube). The chest tube drains fluid, blood, and extra air. It can also be used to expand a pneumothorax, if needed.  Surgery to open the chest and control bleeding (thoracotomy). You may need surgery if bleeding continues after you have a chest tube placed.  IV fluids.  Blood transfusion. You may receive: ? Your own blood that is collected from a chest drainage device and infused back into your body (autotransfusion). ? Blood from a donor (blood  transfusion). Follow these instructions at home: Medicines  Take over-the-counter and prescription medicines only as told by your health care provider.  Do not drive or use heavy machinery while taking prescription pain medicine. General instructions  Return to your normal activities as told by your health care provider. Ask your health care provider what activities are safe for you.  If a cough or pain makes it difficult for you to sleep at night, try sleeping in a semi-upright position in a recliner or by using 2 or 3 pillows.  If you were given an incentive spirometer, use it every 1-2 hours while you are awake or as recommended by your health care provider. This device measures how well you are filling your lungs with each breath.  If you had a chest tube and it was removed, ask your health care provider when you can remove the bandage (dressing). While the dressing is in place, do not allow it to get wet.  Keep all follow-up visits as told by your health care provider. This is important.  Do not use any products that contain nicotine or tobacco, such as cigarettes and e-cigarettes. If you need help quitting, ask your health care provider. Contact a health care provider if:  Your pain is not controlled with the medicines you were prescribed.  You were treated with a chest tube, and you have redness, increasing pain, or discharge at the site where it was placed.  You have a fever. Get help right away if:  You have difficulty breathing.  You begin coughing up  blood.  You have chest pain. These symptoms may represent a serious problem that is an emergency. Do not wait to see if the symptoms will go away. Get medical help right away. Call your local emergency services (911 in the [place] Do not drive yourself to the hospital. Summary  Hemothorax is a buildup of blood in the space between the lungs and the chest wall (pleural cavity).  This condition is a medical emergency that  must be treated in a hospital right away.  Hemothorax is most often caused by an injury (trauma) that causes a tear in a lung or in a blood vessel in the chest. Other possible causes include tuberculosis, cancer, blood thinner (anticoagulant) medicines, lung or heart surgery, or a problem with how your blood clots.  Treatment includes receiving donated blood (blood transfusion) and having a small tube placed in the pleural cavity (chest tube). The tube can help drain blood, fluid, and extra air. It can also be used to expand a collapsed lung (pneumothorax).  In some cases, surgery is needed to stop the bleeding. This information is not intended to replace advice given to you by your health care provider. Make sure you discuss any questions you have with your health care provider. Document Revised: 11/23/2017 Document Reviewed: 11/23/2017 Elsevier Patient Education  2021 [employer].

## 2021-03-12 NOTE — Progress Notes (Signed)
[facility] called by this nurse to inquire about visiting hours. Visiting hours are lifted. After 6 pm visitors will have to call number on the door to be let in. [facility] [phone], pt going to room 211.

## 2021-03-12 NOTE — Progress Notes (Signed)
Pt husband noted in room with patient. Stated he decided to come here to help patient. Pt husband stated he also wondered if [facility] will close if it is too late. Husband made aware that team recommended husband to go there, staff can assist patient with getting ready. Husband stated he will go now.

## 2021-03-12 NOTE — Progress Notes (Incomplete)
cardizem 30mg  Fish oil  Vitamin d

## 2021-03-12 NOTE — TOC Progression Note (Addendum)
Transition of Care [facility]) - Progression Note    Patient Details  Name: Connie Chase MRN: [id number] Date of Birth: September 29, 1946  [facility] CM/SW Contact  Angelita Ingles, RN Phone Number: [phone]  03/12/2021, 12:26 PM  Clinical Narrative:    CM received message that patient has been insurance denial for CIR. CM at bedside to present bed offer list to patient and husband. Husband is requesting placement at [facility]. CM has reached out to [place] [facility].Bed offer has been extended to patient. New Insurance auth for SNF has been initiated. Auth pending. TOC will continue to follow.   1300 CM received call from [place] [facility]. Insurance auth for SNF has been approved for 7 days. Auth ID # [id number]. [facility] has been made aware and can accept patient today. Covid test has been requested. Results pending. Husband at bedside and has been updated on the pending discharge for today. Husband has also been made aware of need to take patients Entecavir ( which is a high cost med) to the facility for dispensing. Husband verbalized understanding and is agreeable.         Expected Discharge Plan and Services           Expected Discharge Date: 03/12/21                                     Social Determinants of Health (SDOH) Interventions    Readmission Risk Interventions No flowsheet data found.

## 2021-03-12 NOTE — TOC Transition Note (Signed)
Transition of Care [facility]) - CM/SW Discharge Note   Patient Details  Name: Connie Chase MRN: [id number] Date of Birth: [birth year]  Transition of Care [facility]) CM/SW Contact:  Angelita Ingles, RN Phone Number:  [phone]  03/12/2021, 2:03 PM   Clinical Narrative:    Patient to be discharged to [facility]. Transportation has been set up with [facility] there is an extended wait but transport has been arranged. Husband at bedside and aware of transport. Discharge packet placed in patients shadow chart at nurses station. Bedside nurse made aware. No other needs noted at this time. TOC will sign off.  Please call report to  [facility] ask for 200 hall nurse Room # 211  Final next level of care: Skilled Nursing Facility Barriers to Discharge: No Barriers Identified   Patient Goals and CMS Choice Patient states their goals for this hospitalization and ongoing recovery are:: Patient just wants to get better CMS Medicare.gov Compare Post Acute Care list provided to:: Patient Represenative (must comment) Choice offered to / list presented to : Spouse  Discharge Placement PASRR number recieved: 03/08/21 ([id number])            Patient chooses bed at: [facility] Patient to be transferred to facility by: [place] Name of family member notified: Bernyce Brimley husband (at bedside/ was updated) Patient and family notified of of transfer: 03/12/21  Discharge Plan and Services                DME Arranged: N/A DME Agency: NA       HH Arranged: NA HH Agency: NA        Social Determinants of Health (SDOH) Interventions     Readmission Risk Interventions No flowsheet data found.

## 2021-03-12 NOTE — Discharge Summary (Addendum)
Physician Discharge Summary  Connie Chase CHE:[id number] DOB: 1945-12-04 DOA: 03/03/2021  PCP: Connie Jordan, MD  Admit date: 03/03/2021 Discharge date: 03/13/2021 30 Day Unplanned Readmission Risk Score   Flowsheet Row ED to Hosp-Admission (Current) from 03/03/2021 in [facility]  30 Day Unplanned Readmission Risk Score (%) 12.4 Filed at 03/12/2021 1200     This score is the patient's risk of an unplanned readmission within 30 days of being discharged (0 -100%). The score is based on dignosis, age, lab data, medications, orders, and past utilization.   Low:  0-14.9   Medium: 15-21.9   High: 22-29.9   Extreme: 30 and above         Admitted From: Home Disposition: SNF  Recommendations for Outpatient Follow-up:  1. Follow up with PCP in 1-2 weeks 2. Follow-up with primary cardiologist in 2 weeks 3. Please obtain BMP/CBC in one week 4. Please follow up with your PCP on the following pending results: Unresulted Labs (From admission, onward)          Start     Ordered   03/06/21 0500  Magnesium  Every 48 hours,   R     Question:  Specimen collection method  Answer:  Unit=Unit collect   03/05/21 1938   03/06/21 0500  Vitamin B1  Tomorrow morning,   R       Question:  Specimen collection method  Answer:  Unit=Unit collect   03/05/21 [place]: None Equipment/Devices: None  Discharge Condition: Stable CODE STATUS: Full code Diet recommendation: Cardiac  Subjective: Seen and examined.  Husband at the bedside.  She has no complaints.  Brief/Interim Summary: Connie Chenis a 75 y.o.femalewith Past medical history of chronic A. 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  Also had A. fib with RVR and severe constipation. CT  C-spine, MRI brain, CT head unremarkable for any acute abnormality. Trauma surgery saw her and signed off. Status post left-sided thoracentesis with 600 cc removal on 03/06/2021.  After discussion between hospitalist and family Patient's anticoagulation has been stopped since she has risk of more spontaneous bleeding due to recurrent fall.  She was also evaluated by neurology for recurrent falls and generalized weakness and all the work-up was negative.  Electrolytes replenished.  Due to her underweight/cachectic state, she was also evaluated by nutrition.  Patient has remained stable and on room air for last several days except that yesterday she became hypoxic and required 2 L of oxygen.  Lungs clear to auscultation and despite of that she has no complaints, no shortness of breath.  Patient's A. fib has remained a challenge during this hospitalization.  Typically she would have heart rate in 150s especially when she would be eating.  Due to this, her Cardizem CD was increased gradually from 120 mg nightly to 240 mg and we continued her short acting Cardizem per patient and her husband's request.  Finally her A. fib is under control.  She was seen by PT OT.  Initially they recommended SNF however they updated their recommendation to CIR.  Insurance company declined [facility].  Peer-to-peer was done by me today.  She was declined again.  She was approved for SNF.  Husband is agreeable with that plan.  Patient is  being discharged in stable condition.   Dilated CBD. Incidentally seen on the CT scan.  Currently no evidence of acute abdominal pain or symptoms. Also LFTs are stable. No further work-up.  Goals of care conversation. Patient has recurrent fall and has multiple comorbidities and appears to be severely cachectic. In the setting of cardiac arrest patient will not have good outcome.  Currently she is full code.  Previous hospitalist had a conversation with the family.  Discharge Diagnoses:   Principal Problem:   Hemopneumothorax on left Active Problems:   Persistent atrial fibrillation (HCC)   Multiple rib fractures   Fall at home, initial encounter   Chronic anticoagulation   Protein-calorie malnutrition, severe    Discharge Instructions   Allergies as of 03/13/2021      Reactions   Fosamax [alendronate]    Other reaction(s): Muscle and joint pain      Medication List    STOP taking these medications   apixaban 2.5 MG Tabs tablet Commonly known as: ELIQUIS   cephALEXin 500 MG capsule Commonly known as: KEFLEX   dofetilide 125 MCG capsule Commonly known as: TIKOSYN     TAKE these medications   b complex vitamins capsule Take 1 capsule by mouth daily.   bisacodyl 10 MG suppository Commonly known as: DULCOLAX Place 10 mg rectally as needed for moderate constipation.   Cartia XT 240 MG 24 hr capsule Generic drug: diltiazem Take 1 capsule (240 mg total) by mouth at bedtime. What changed:   medication strength  how much to take  when to take this   Vitamin D 50 MCG (2000 UT) tablet Take 2,000 Units by mouth every morning.   cholecalciferol 1000 units tablet Commonly known as: VITAMIN D Take 1,000 Units by mouth daily with lunch.   diltiazem 30 MG tablet Commonly known as: CARDIZEM Take 30 mg by mouth 2 (two) times daily. Morning and lunch   dorzolamide 2 % ophthalmic solution Commonly known as: TRUSOPT Place 1 drop into both eyes 2 (two) times daily.   entecavir 1 MG tablet Commonly known as: BARACLUDE Take 1 mg by mouth in the morning.   ICAPS AREDS 2 PO Take 1 tablet by mouth every morning.   Klor-Con M20 20 MEQ tablet Generic drug: potassium chloride SA TAKE 1 TABLET (20 MEQ TOTAL) BY MOUTH DAILY. What changed: how much to take   magnesium oxide 400 MG tablet Commonly known as: MAG-OX Take 400 mg by mouth at bedtime.   MILK OF MAGNESIA PO Take by mouth.   OMEGA 3 PO Take 1 capsule by mouth 2 (two) times daily.   RA  SALINE ENEMA RE Place rectally.   UNABLE TO FIND 120 mLs 2 (two) times daily. Med Pass   UNABLE TO FIND at bedtime. Magic cup       Follow-up Information    Connie Jordan, MD Follow up in 1 week(s).   Specialty: Family Medicine Contact information: [address] [place] [address] [phone]              Allergies  Allergen Reactions  . Fosamax [Alendronate]     Other reaction(s): Muscle and joint pain    Consultations: Neurology and trauma surgery   Procedures/Studies: DG Chest 1 View  Result Date: 03/06/2021 CLINICAL DATA:  Post left thoracentesis. EXAM: CHEST  1 VIEW COMPARISON:  Chest radiograph yesterday. FINDINGS: Decreased left pleural fluid after thoracentesis. Streaky opacities at the left lung base, favor atelectasis. Possible but not definite  tiny left apical pneumothorax. The heart is normal in size. Unchanged mediastinal contours with aortic atherosclerosis. Right lung is clear. Left rib fractures again seen. IMPRESSION: Decreased left pleural fluid after thoracentesis. Possible but not definite tiny left apical pneumothorax. Electronically Signed   By: Keith Rake M.D.   On: 03/06/2021 15:43   DG Chest 2 View  Result Date: 03/03/2021 CLINICAL DATA:  Weakness.  Fall.  Fever. EXAM: CHEST - 2 VIEW COMPARISON:  Most recent chest radiograph 01/22/2010 FINDINGS: Patient is rotated. Left pleural effusion is at least moderate in size. Small left apical pneumothorax, visualized between the posterior second and third rib. Right lung is clear. Heart size grossly normal. Bones are diffusely under mineralized. Mildly displaced fracture of left lateral sixth rib. IMPRESSION: 1. Small left apical pneumothorax and at least moderate left pleural effusion. 2. Mildly displaced left lateral sixth rib fracture. These results were called by telephone at the time of interpretation on 03/03/2021 at 5:25 pm to provider [facility] , who verbally  acknowledged these results. Electronically Signed   By: Keith Rake M.D.   On: 03/03/2021 17:25   CT Head Wo Contrast  Result Date: 03/03/2021 CLINICAL DATA:  Fall with head trauma and bleeding. EXAM: CT HEAD WITHOUT CONTRAST TECHNIQUE: Contiguous axial images were obtained from the base of the skull through the vertex without intravenous contrast. COMPARISON:  Report from a brain MR dated 07/06/2018. FINDINGS: Brain: No evidence of acute infarction, hemorrhage, hydrocephalus, extra-axial collection or mass lesion/mass effect. There is mild cerebral volume loss with associated ex vacuo dilatation. Periventricular white matter hypoattenuation likely represents chronic small vessel ischemic disease. Encephalomalacia is seen in the right frontal parasagittal region. Vascular: There are vascular calcifications in the carotid siphons. Skull: Normal. Negative for fracture or focal lesion. Sinuses/Orbits: There is chronic sinusitis of the left sphenoid sinus. Other: Scalp soft tissue swelling is seen overlying the right parietal bone. IMPRESSION: 1. No acute intracranial process. Electronically Signed   By: Zerita Boers M.D.   On: 03/03/2021 17:35   MR BRAIN W WO CONTRAST  Result Date: 03/04/2021 CLINICAL DATA:  Initial evaluation for slowly progressive weakness in all 4 extremities for several years with recent acceleration, left greater than right. EXAM: MRI HEAD WITHOUT AND WITH CONTRAST MRI CERVICAL SPINE WITHOUT AND WITH CONTRAST TECHNIQUE: Multiplanar, multiecho pulse sequences of the brain and surrounding structures, and cervical spine, to include the craniocervical junction and cervicothoracic junction, were obtained without and with intravenous contrast. CONTRAST:  3.39mL GADAVIST GADOBUTROL 1 MMOL/ML IV SOLN COMPARISON:  Comparison made with previous MRI from 07/06/2018. FINDINGS: MRI HEAD FINDINGS Brain: Mild diffuse prominence of the CSF containing spaces compatible generalized age-related cerebral  atrophy, similar to previous. Patchy T2/FLAIR hyperintensity within the periventricular white matter and pons, most consistent with chronic small vessel ischemic disease, mild in nature, but mildly progressed as compared to 2019. Area of encephalomalacia and gliosis at the parasagittal anterior right frontal lobe again seen, which could be related to prior ischemia or possibly trauma, stable. No abnormal foci of restricted diffusion to suggest acute or subacute ischemia. Gray-white matter differentiation otherwise maintained. No other areas of encephalomalacia to suggest chronic cortical infarction. No acute intracranial hemorrhage. Few small chronic micro hemorrhages noted within the subcortical right parietal region, likely small vessel related. No mass lesion, midline shift or mass effect. No hydrocephalus or extra-axial fluid collection. Pituitary gland suprasellar region within normal limits. Midline structures intact. No abnormal enhancement. Vascular: Major intracranial vascular flow voids are maintained.  Skull and upper cervical spine: Craniocervical junction within normal limits. Bone marrow signal intensity normal. No focal marrow replacing lesion. Soft tissue contusion present at the right parietal scalp. 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 Globes and orbital soft tissues demonstrate no acute finding. Paranasal sinuses are clear. No significant mastoid effusion. Inner ear structures grossly normal. Other: None. MRI CERVICAL SPINE FINDINGS Alignment: Underlying mild levoscoliosis. Reversal of the normal cervical lordosis with apex at C3-4. 2 mm anterolisthesis of C2 on C3, with 2 mm retrolisthesis of C4 on C5. Findings chronic and facet mediated. Vertebrae: Vertebral body height maintained without acute or chronic fracture. Bone marrow signal intensity within normal limits. No discrete or worrisome osseous lesions. No abnormal marrow edema  or enhancement. Cord: Normal signal and morphology.  No abnormal enhancement. Posterior Fossa, vertebral arteries, paraspinal tissues: Craniocervical junction within normal limits. Paraspinous and prevertebral soft tissues are normal. Normal flow voids seen within the vertebral arteries bilaterally. Large left pleural effusion partially visualized. Disc levels: C2-C3: 2 mm anterolisthesis. Broad posterior disc osteophyte flattens and partially effaces the ventral thecal sac. Left greater than right facet hypertrophy. No significant spinal stenosis. Mild left C3 foraminal narrowing. Right neural foramina remains patent. Appearance is stable. C3-C4: Degenerative intervertebral disc space narrowing with diffuse disc osteophyte complex. Broad posterior component indents and partially effaces the ventral thecal sac. Mild flattening of the ventral cord without cord signal changes. Mild spinal stenosis. Moderate bilateral C4 foraminal stenosis, slightly worse on the left. Appearance is mildly progressed from previous. C4-C5: 2 mm retrolisthesis. Degenerative intervertebral disc space narrowing with diffuse disc osteophyte complex. Broad posterior component flattens and partially effaces the ventral thecal sac, asymmetric to the right. Mild ligament flavum hypertrophy. Resultant mild spinal stenosis. Mild cord flattening without cord signal changes. Moderate left with severe right C5 foraminal stenosis. Appearance is mildly progressed from previous. C5-C6: Degenerative intervertebral disc space narrowing with diffuse disc osteophyte complex. Broad posterior component flattens and partially effaces the ventral thecal sac. Superimposed mild right worse than left facet hypertrophy. Resultant mild spinal stenosis with minimal cord flattening. Severe right worse than left C6 foraminal stenosis. Appearance is mildly progressed from previous. C6-C7: Degenerative intervertebral disc space narrowing with diffuse disc osteophyte  complex. Broad posterior component indents and partially faces the ventral thecal sac, asymmetric to the left. Superimposed central to left subarticular disc extrusion with inferior migration (series 9, image 25). Right worse than left facet degeneration. Resultant mild spinal stenosis with mild cord flattening, but no cord signal changes. Severe bilateral C7 foraminal stenosis. Appearance is mildly progressed from previous. C7-T1: Negative interspace. Moderate right worse than left facet hypertrophy. No significant spinal stenosis. Foramina remain patent. Visualized upper thoracic spine demonstrates no significant finding. IMPRESSION: MRI HEAD IMPRESSION: 1. No acute intracranial abnormality. 2. Chronic encephalomalacia and gliosis involving the parasagittal anterior right frontal lobe, which could be related to prior infarct or traumatic injury. 3. Mild age-related cerebral atrophy with chronic microvascular ischemic disease, mildly progressed from previous. 4. Right parietal scalp contusion. MRI CERVICAL SPINE IMPRESSION: 1. No acute abnormality within the cervical spine. 2. Moderate multilevel cervical spondylosis with resultant mild diffuse spinal stenosis at C3-4 through C6-7, overall mildly progressed as compared to 2019. 3. Multifactorial degenerative changes with resultant multilevel foraminal narrowing as above. Notable findings include moderate bilateral C4 foraminal stenosis, moderate left with severe right C5 foraminal narrowing, with severe bilateral C6 and C7 foraminal stenosis. 4. Layering left pleural effusion, partially visualized. Electronically  Signed   By: Jeannine Boga M.D.   On: 03/04/2021 20:39   MR CERVICAL SPINE W WO CONTRAST  Result Date: 03/04/2021 CLINICAL DATA:  Initial evaluation for slowly progressive weakness in all 4 extremities for several years with recent acceleration, left greater than right. EXAM: MRI HEAD WITHOUT AND WITH CONTRAST MRI CERVICAL SPINE WITHOUT AND WITH  CONTRAST TECHNIQUE: Multiplanar, multiecho pulse sequences of the brain and surrounding structures, and cervical spine, to include the craniocervical junction and cervicothoracic junction, were obtained without and with intravenous contrast. CONTRAST:  3.1mL GADAVIST GADOBUTROL 1 MMOL/ML IV SOLN COMPARISON:  Comparison made with previous MRI from 07/06/2018. FINDINGS: MRI HEAD FINDINGS Brain: Mild diffuse prominence of the CSF containing spaces compatible generalized age-related cerebral atrophy, similar to previous. Patchy T2/FLAIR hyperintensity within the periventricular white matter and pons, most consistent with chronic small vessel ischemic disease, mild in nature, but mildly progressed as compared to 2019. Area of encephalomalacia and gliosis at the parasagittal anterior right frontal lobe again seen, which could be related to prior ischemia or possibly trauma, stable. No abnormal foci of restricted diffusion to suggest acute or subacute ischemia. Gray-white matter differentiation otherwise maintained. No other areas of encephalomalacia to suggest chronic cortical infarction. No acute intracranial hemorrhage. Few small chronic micro hemorrhages noted within the subcortical right parietal region, likely small vessel related. No mass lesion, midline shift or mass effect. No hydrocephalus or extra-axial fluid collection. Pituitary gland suprasellar region within normal limits. Midline structures intact. No abnormal enhancement. Vascular: Major intracranial vascular flow voids are maintained. Skull and upper cervical spine: Craniocervical junction within normal limits. Bone marrow signal intensity normal. No focal marrow replacing lesion. Soft tissue contusion present at the right parietal scalp. 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 Globes and orbital soft tissues demonstrate no acute finding. Paranasal sinuses are clear. No significant mastoid  effusion. Inner ear structures grossly normal. Other: None. MRI CERVICAL SPINE FINDINGS Alignment: Underlying mild levoscoliosis. Reversal of the normal cervical lordosis with apex at C3-4. 2 mm anterolisthesis of C2 on C3, with 2 mm retrolisthesis of C4 on C5. Findings chronic and facet mediated. Vertebrae: Vertebral body height maintained without acute or chronic fracture. Bone marrow signal intensity within normal limits. No discrete or worrisome osseous lesions. No abnormal marrow edema or enhancement. Cord: Normal signal and morphology.  No abnormal enhancement. Posterior Fossa, vertebral arteries, paraspinal tissues: Craniocervical junction within normal limits. Paraspinous and prevertebral soft tissues are normal. Normal flow voids seen within the vertebral arteries bilaterally. Large left pleural effusion partially visualized. Disc levels: C2-C3: 2 mm anterolisthesis. Broad posterior disc osteophyte flattens and partially effaces the ventral thecal sac. Left greater than right facet hypertrophy. No significant spinal stenosis. Mild left C3 foraminal narrowing. Right neural foramina remains patent. Appearance is stable. C3-C4: Degenerative intervertebral disc space narrowing with diffuse disc osteophyte complex. Broad posterior component indents and partially effaces the ventral thecal sac. Mild flattening of the ventral cord without cord signal changes. Mild spinal stenosis. Moderate bilateral C4 foraminal stenosis, slightly worse on the left. Appearance is mildly progressed from previous. C4-C5: 2 mm retrolisthesis. Degenerative intervertebral disc space narrowing with diffuse disc osteophyte complex. Broad posterior component flattens and partially effaces the ventral thecal sac, asymmetric to the right. Mild ligament flavum hypertrophy. Resultant mild spinal stenosis. Mild cord flattening without cord signal changes. Moderate left with severe right C5 foraminal stenosis. Appearance is mildly progressed  from previous. C5-C6: Degenerative intervertebral disc space narrowing with diffuse  disc osteophyte complex. Broad posterior component flattens and partially effaces the ventral thecal sac. Superimposed mild right worse than left facet hypertrophy. Resultant mild spinal stenosis with minimal cord flattening. Severe right worse than left C6 foraminal stenosis. Appearance is mildly progressed from previous. C6-C7: Degenerative intervertebral disc space narrowing with diffuse disc osteophyte complex. Broad posterior component indents and partially faces the ventral thecal sac, asymmetric to the left. Superimposed central to left subarticular disc extrusion with inferior migration (series 9, image 25). Right worse than left facet degeneration. Resultant mild spinal stenosis with mild cord flattening, but no cord signal changes. Severe bilateral C7 foraminal stenosis. Appearance is mildly progressed from previous. C7-T1: Negative interspace. Moderate right worse than left facet hypertrophy. No significant spinal stenosis. Foramina remain patent. Visualized upper thoracic spine demonstrates no significant finding. IMPRESSION: MRI HEAD IMPRESSION: 1. No acute intracranial abnormality. 2. Chronic encephalomalacia and gliosis involving the parasagittal anterior right frontal lobe, which could be related to prior infarct or traumatic injury. 3. Mild age-related cerebral atrophy with chronic microvascular ischemic disease, mildly progressed from previous. 4. Right parietal scalp contusion. MRI CERVICAL SPINE IMPRESSION: 1. No acute abnormality within the cervical spine. 2. Moderate multilevel cervical spondylosis with resultant mild diffuse spinal stenosis at C3-4 through C6-7, overall mildly progressed as compared to 2019. 3. Multifactorial degenerative changes with resultant multilevel foraminal narrowing as above. Notable findings include moderate bilateral C4 foraminal stenosis, moderate left with severe right C5 foraminal  narrowing, with severe bilateral C6 and C7 foraminal stenosis. 4. Layering left pleural effusion, partially visualized. Electronically Signed   By: Jeannine Boga M.D.   On: 03/04/2021 20:39   CT CHEST ABDOMEN PELVIS W CONTRAST  Result Date: 03/03/2021 CLINICAL DATA:  Rib fracture with traumatic hemopneumothorax. Fall 2 days ago with muscle weakness. EXAM: CT CHEST, ABDOMEN, AND PELVIS WITH CONTRAST TECHNIQUE: Multidetector CT imaging of the chest, abdomen and pelvis was performed following the standard protocol during bolus administration of intravenous contrast. CONTRAST:  148mL OMNIPAQUE IOHEXOL 300 MG/ML  SOLN COMPARISON:  Chest radiograph earlier today. FINDINGS: CT CHEST FINDINGS Cardiovascular: No acute aortic or vascular injury. Mild aortic atherosclerosis. Upper normal heart size. There are coronary artery calcifications. No pericardial effusion. Mediastinum/Nodes: No mediastinal hemorrhage or hematoma. No pneumomediastinum. Heterogeneous thyroid gland without dominant nodule by CT. Patulous esophagus. Lungs/Pleura: Hemo pneumothorax, with moderate fluid component and small pneumothorax component. Pneumothorax component is less than 10%, greatest at the apex. No active extravasation within hemothorax. There is associated compressive atelectasis in the left lower lobe. Lingular consolidation is also likely atelectasis. No right pneumothorax. Trachea and central bronchi are patent. Musculoskeletal: Left lateral rib fractures 5 through 10, many of which are mildly displaced and comminuted. Tenth rib fracture may be subacute. The seventh rib fracture is segmental. No right rib fracture. Buckling of the lower sternal body with sclerosis suggestive of subacute or chronic sternal body fracture. No acute fracture of the thoracic spine. No acute fracture of the included clavicles or shoulder girdles. There is left chest wall soft tissue edema as well as intramuscular edema of the intercostal cell. No  subcutaneous gas. CT ABDOMEN PELVIS FINDINGS Hepatobiliary: No hepatic injury or perihepatic hematoma. Tiny cyst in the right hepatic dome. Mild hepatic steatosis. Prominent common bile duct measuring 8 mm. No visualized choledocholithiasis. Gallbladder is unremarkable. Pancreas: No evidence of injury. No ductal dilatation or inflammation. Spleen: No splenic injury or perisplenic hematoma. Tiny low-density splenic lesions are too small to characterize, likely cysts. Adrenals/Urinary Tract: No adrenal hemorrhage  or renal injury identified. Punctate right adrenal calcification likely sequela of prior hemorrhage. Small left renal cyst. Bladder is unremarkable. Stomach/Bowel: No obvious bowel injury, wall thickening or inflammation. Decompressed stomach. Large volume of stool throughout the colon. There is fecalization of distal small bowel contents. Normal appendix. Vascular/Lymphatic: No acute vascular injury. Aortic atherosclerosis and tortuosity. Retroperitoneal fluid. No bulky adenopathy. Reproductive: Retroverted uterus which is mildly bulky and contains scattered calcifications, suggestive of fibroids. No visualized adnexal mass. Other: No intra-abdominal free air or free fluid. Musculoskeletal: No fracture of the lumbar spine or pelvis. Scoliosis and degenerative change throughout the lumbar spine. IMPRESSION: 1. Left lateral rib fractures 5 through 10, many of which are mildly displaced and comminuted. The seventh rib fracture is segmental. Tenth rib fracture may be subacute. 2. Hemo pneumothorax with moderate fluid component and small pneumothorax component. Pneumothorax component is less than 10%, greatest at the apex. No active extravasation. 3. Buckling of the lower sternal body with sclerosis suggestive of subacute or chronic sternal body fracture. 4. No evidence of acute traumatic injury to the abdomen or pelvis. 5. Large volume of stool throughout the colon with fecalization of distal small bowel  contents, suggesting constipation. 6. Prominent common bile duct measuring 8 mm, no visualized choledocholithiasis. Recommend correlation with LFTs. If LFTs are elevated or there is clinical concern for biliary pathology, consider further evaluation with MRCP, on an elective basis after resolution of acute event. Aortic Atherosclerosis (ICD10-I70.0). Electronically Signed   By: Keith Rake M.D.   On: 03/03/2021 21:41   DG CHEST PORT 1 VIEW  Result Date: 03/07/2021 CLINICAL DATA:  Shortness of breath. EXAM: PORTABLE CHEST 1 VIEW COMPARISON:  March 06, 2021. FINDINGS: The heart size and mediastinal contours are within normal limits. Right lung is clear. Probable minimal left apical pneumothorax is noted which is not significantly changed compared to prior exam. Stable left basilar atelectasis is noted with probable small left pleural effusion. Multiple comminuted and segmental fractures are seen involving the left ribs as noted on prior exam. IMPRESSION: Probable minimal left apical pneumothorax. Stable left pleural effusion with associated left basilar atelectasis. Stable multiple left rib fractures as noted above. Electronically Signed   By: Marijo Conception M.D.   On: 03/07/2021 08:51   DG CHEST PORT 1 VIEW  Result Date: 03/05/2021 CLINICAL DATA:  Left hemo pneumothorax EXAM: PORTABLE CHEST 1 VIEW COMPARISON:  03/04/2021 FINDINGS: Left apical pneumothorax has resolved. Moderate left pleural fluid persists. Multiple acute left rib fractures are again identified. The right lung is clear. No pneumothorax or pleural effusion on the right. Cardiac size is within normal limits. The pulmonary vascularity is normal. IMPRESSION: Resolved left apical pneumothorax. Stable moderate left pleural effusion. Multiple acute left rib fractures again noted. Electronically Signed   By: Fidela Salisbury MD   On: 03/05/2021 06:26   DG Chest Port 1 View  Result Date: 03/04/2021 CLINICAL DATA:  Recent hemopneumothorax EXAM:  PORTABLE CHEST 1 VIEW COMPARISON:  March 03, 2021 chest radiograph and chest CT. FINDINGS: Persistent small right apical pneumothorax that tension component. Pleural fluid on the left again noted. Left hemidiaphragm borders not appreciable by radiography. Multiple displaced rib fractures noted on the left. There is left base atelectasis. The right lung is clear. Heart size and pulmonary vascularity are normal. No adenopathy. There is aortic atherosclerosis. IMPRESSION: Persistent small left apical pneumothorax without tension component. Left pleural fluid, perhaps slightly less than 1 day prior with left base atelectasis. Left hemidiaphragm borders not appreciable  on this exam. Note that injury to the left hemidiaphragm cannot be excluded by this examination. Multiple rib fractures on the left noted. Right lung clear. Stable cardiac silhouette. Aortic Atherosclerosis (ICD10-I70.0). Electronically Signed   By: Lowella Grip III M.D.   On: 03/04/2021 08:53   ECHOCARDIOGRAM COMPLETE  Result Date: 03/05/2021    ECHOCARDIOGRAM REPORT   Patient Name:   EVORA FLEITES Date of Exam: 03/05/2021 Medical Rec #:  [id number]   Height:       62.5 in Accession #:    [id number]  Weight:       85.5 lb Date of Birth:  Jul 16, 1946   BSA:          1.340 m Patient Age:    43 years    BP:           106/65 mmHg Patient Gender: F           HR:           95 bpm. Exam Location:  Inpatient Procedure: 2D Echo, Cardiac Doppler and Color Doppler Indications:    Tachycardia  History:        Patient has prior history of Echocardiogram examinations, most                 recent 02/16/2015. Arrythmias:Atrial Fibrillation.  Sonographer:    Luisa Hart RDCS Referring Phys: [id number] Josetta Huddle PATEL  Sonographer Comments: [id number] cardioversions 09/06/2015 AF ablation 09/04/2016 AF ablation IMPRESSIONS  1. Left ventricular ejection fraction, by estimation, is 60 to 65%. The left ventricle has normal function. The left ventricle has no regional wall  motion abnormalities. There is mild concentric left ventricular hypertrophy. Left ventricular diastolic function could not be evaluated.  2. Right ventricular systolic function is normal. The right ventricular size is normal. There is mildly elevated pulmonary artery systolic pressure.  3. Large pleural effusion in the left lateral region. Large fibrinous masses/clots are seen in the pleural space.  4. The mitral valve is normal in structure. No evidence of mitral valve regurgitation.  5. Tricuspid valve regurgitation is mild to moderate.  6. The aortic valve is normal in structure. Aortic valve regurgitation is not visualized. Mild aortic valve sclerosis is present, with no evidence of aortic valve stenosis.  7. The inferior vena cava is dilated in size with <50% respiratory variability, suggesting right atrial pressure of 15 mmHg. FINDINGS  Left Ventricle: Left ventricular ejection fraction, by estimation, is 60 to 65%. The left ventricle has normal function. The left ventricle has no regional wall motion abnormalities. The left ventricular internal cavity size was normal in size. There is  mild concentric left ventricular hypertrophy. Left ventricular diastolic function could not be evaluated due to atrial fibrillation. Left ventricular diastolic function could not be evaluated. Right Ventricle: The right ventricular size is normal. No increase in right ventricular wall thickness. Right ventricular systolic function is normal. There is mildly elevated pulmonary artery systolic pressure. The tricuspid regurgitant velocity is 2.62  m/s, and with an assumed right atrial pressure of 15 mmHg, the estimated right ventricular systolic pressure is [id number] mmHg. Left Atrium: Left atrial size was normal in size. Right Atrium: Right atrial size was normal in size. Pericardium: There is no evidence of pericardial effusion. Mitral Valve: The mitral valve is normal in structure. There is mild thickening of the mitral valve  leaflet(s). Mild mitral annular calcification. No evidence of mitral valve regurgitation. Tricuspid Valve: The tricuspid valve is normal in structure. Tricuspid valve regurgitation is  mild to moderate. Aortic Valve: The aortic valve is normal in structure. Aortic valve regurgitation is not visualized. Mild aortic valve sclerosis is present, with no evidence of aortic valve stenosis. Aortic valve mean gradient measures 3.0 mmHg. Aortic valve peak gradient measures 6.2 mmHg. Aortic valve area, by VTI measures 1.05 cm. Pulmonic Valve: The pulmonic valve was normal in structure. Pulmonic valve regurgitation is not visualized. Aorta: The aortic root is normal in size and structure. Venous: The inferior vena cava is dilated in size with less than 50% respiratory variability, suggesting right atrial pressure of 15 mmHg. IAS/Shunts: No atrial level shunt detected by color flow Doppler. Additional Comments: There is a large pleural effusion in the left lateral region.  LEFT VENTRICLE PLAX 2D LVIDd:         3.40 cm LVIDs:         1.60 cm LV PW:         1.30 cm LV IVS:        1.30 cm LVOT diam:     1.70 cm LV SV:         22 LV SV Index:   17 LVOT Area:     2.27 cm  LV Volumes (MOD) LV vol d, MOD A2C: 26.0 ml LV vol d, MOD A4C: 33.1 ml LV vol s, MOD A4C: 13.6 ml LV SV MOD A4C:     33.1 ml RIGHT VENTRICLE RV S prime:     11.40 cm/s TAPSE (M-mode): 1.7 cm LEFT ATRIUM           Index       RIGHT ATRIUM           Index LA diam:      3.10 cm 2.31 cm/m  RA Area:     12.40 cm LA Vol (A2C): 17.9 ml 13.36 ml/m RA Volume:   27.00 ml  20.15 ml/m LA Vol (A4C): 23.0 ml 17.16 ml/m  AORTIC VALVE                   PULMONIC VALVE AV Area (Vmax):    1.30 cm    PV Vmax:       0.81 m/s AV Area (Vmean):   1.21 cm    PV Vmean:      51.400 cm/s AV Area (VTI):     1.05 cm    PV VTI:        0.127 m AV Vmax:           125.00 cm/s PV Peak grad:  2.6 mmHg AV Vmean:          83.300 cm/s PV Mean grad:  1.0 mmHg AV VTI:            0.214 m AV Peak  Grad:      6.2 mmHg AV Mean Grad:      3.0 mmHg LVOT Vmax:         71.80 cm/s LVOT Vmean:        44.300 cm/s LVOT VTI:          0.099 m LVOT/AV VTI ratio: 0.46  AORTA Ao Root diam: 3.00 cm MITRAL VALVE               TRICUSPID VALVE MV Area (PHT): 3.65 cm    TR Peak grad:   27.5 mmHg MV Decel Time: 208 msec    TR Vmax:        262.00 cm/s MV E velocity: 78.00 cm/s  SHUNTS                            Systemic VTI:  0.10 m                            Systemic Diam: 1.70 cm Dani Gobble Croitoru MD Electronically signed by Sanda Klein MD Signature Date/Time: 03/05/2021/12:40:10 PM    Final    IR THORACENTESIS ASP PLEURAL SPACE W/IMG GUIDE  Result Date: 03/06/2021 INDICATION: Shortness of breath. Left-sided pleural effusion after fall. Request for therapeutic thoracentesis. EXAM: ULTRASOUND GUIDED LEFT THORACENTESIS MEDICATIONS: 1% plain lidocaine, 5 mL COMPLICATIONS: None immediate. PROCEDURE: An ultrasound guided thoracentesis was thoroughly discussed with the patient and questions answered. The benefits, risks, alternatives and complications were also discussed. The patient understands and wishes to proceed with the procedure. Written consent was obtained. Ultrasound was performed to localize and mark an adequate pocket of fluid in the left chest. The area was then prepped and draped in the normal sterile fashion. 1% Lidocaine was used for local anesthesia. Under ultrasound guidance a 6 Fr Safe-T-Centesis catheter was introduced. Thoracentesis was performed. The catheter was removed and a dressing applied. FINDINGS: A total of approximately 600 mL of thin, dark bloody fluid was removed. IMPRESSION: Successful ultrasound guided left thoracentesis yielding 600 mL of pleural fluid. Read by: Ascencion Dike PA-C Electronically Signed   By: Jerilynn Mages.  Shick M.D.   On: 03/06/2021 15:55     Discharge Exam: Vitals:   03/13/21 0915 03/13/21 0930  BP: 111/65 116/60  Pulse: 95 100  Resp: 20 17  Temp:     SpO2: 97% 95%   Vitals:   03/13/21 0807 03/13/21 0815 03/13/21 0915 03/13/21 0930  BP:  122/71 111/65 116/60  Pulse:  (!) 102 95 100  Resp:  (!) 21 20 17   Temp: 98.5 F (36.9 C)     TempSrc: Oral     SpO2:  96% 97% 95%  Weight:      Height:        General: Pt is alert, awake, not in acute distress Cardiovascular: Irregularly irregular rate and rhythm, S1/S2 +, no rubs, no gallops Respiratory: CTA bilaterally, no wheezing, no rhonchi Abdominal: Soft, NT, ND, bowel sounds + Extremities: no edema, no cyanosis    The results of significant diagnostics from this hospitalization (including imaging, microbiology, ancillary and laboratory) are listed below for reference.     Microbiology: Recent Results (from the past 240 hour(s))  Resp Panel by RT-PCR (Flu A&B, Covid) Nasopharyngeal Swab     Status: None   Collection Time: 03/04/21  1:13 AM   Specimen: Nasopharyngeal Swab; Nasopharyngeal(NP) swabs in vial transport medium  Result Value Ref Range Status   SARS Coronavirus 2 by RT PCR NEGATIVE NEGATIVE Final    Comment: (NOTE) SARS-CoV-2 target nucleic acids are NOT DETECTED.  The SARS-CoV-2 RNA is generally detectable in upper respiratory specimens during the acute phase of infection. The lowest concentration of SARS-CoV-2 viral copies this assay can detect is 138 copies/mL. A negative result does not preclude SARS-Cov-2 infection and should not be used as the sole basis for treatment or other patient management decisions. A negative result may occur with  improper specimen collection/handling, submission of specimen other than nasopharyngeal swab, presence of viral mutation(s) within the areas targeted by this assay, and inadequate number of viral copies(<138 copies/mL). A negative result must be combined with clinical observations,  patient history, and epidemiological information. The expected result is Negative.  Fact Sheet for Patients:   EntrepreneurPulse.com.au  Fact Sheet for Healthcare Providers:  IncredibleEmployment.be  This test is no t yet approved or cleared by the [place] FDA and  has been authorized for detection and/or diagnosis of SARS-CoV-2 by FDA under an Emergency Use Authorization (EUA). This EUA will remain  in effect (meaning this test can be used) for the duration of the COVID-19 declaration under Section 564(b)(1) of the Act, 21 U.S.C.section 360bbb-3(b)(1), unless the authorization is terminated  or revoked sooner.       Influenza A by PCR NEGATIVE NEGATIVE Final   Influenza B by PCR NEGATIVE NEGATIVE Final    Comment: (NOTE) The Xpert Xpress SARS-CoV-2/FLU/RSV plus assay is intended as an aid in the diagnosis of influenza from Nasopharyngeal swab specimens and should not be used as a sole basis for treatment. Nasal washings and aspirates are unacceptable for Xpert Xpress SARS-CoV-2/FLU/RSV testing.  Fact Sheet for Patients: EntrepreneurPulse.com.au  Fact Sheet for Healthcare Providers: IncredibleEmployment.be  This test is not yet approved or cleared by the [place] FDA and has been authorized for detection and/or diagnosis of SARS-CoV-2 by FDA under an Emergency Use Authorization (EUA). This EUA will remain in effect (meaning this test can be used) for the duration of the COVID-19 declaration under Section 564(b)(1) of the Act, 21 U.S.C. section 360bbb-3(b)(1), unless the authorization is terminated or revoked.  Performed at [facility], [place] [address], [place], [place] [address]   SARS CORONAVIRUS 2 (TAT 6-24 HRS) Nasopharyngeal Nasopharyngeal Swab     Status: None   Collection Time: 03/08/21  2:30 PM   Specimen: Nasopharyngeal Swab  Result Value Ref Range Status   SARS Coronavirus 2 NEGATIVE NEGATIVE Final    Comment: (NOTE) SARS-CoV-2 target nucleic acids are NOT DETECTED.  The  SARS-CoV-2 RNA is generally detectable in upper and lower respiratory specimens during the acute phase of infection. Negative results do not preclude SARS-CoV-2 infection, do not rule out co-infections with other pathogens, and should not be used as the sole basis for treatment or other patient management decisions. Negative results must be combined with clinical observations, patient history, and epidemiological information. The expected result is Negative.  Fact Sheet for Patients: SugarRoll.be  Fact Sheet for Healthcare Providers: https://www.woods-mathews.com/  This test is not yet approved or cleared by the [place] FDA and  has been authorized for detection and/or diagnosis of SARS-CoV-2 by FDA under an Emergency Use Authorization (EUA). This EUA will remain  in effect (meaning this test can be used) for the duration of the COVID-19 declaration under Se ction 564(b)(1) of the Act, 21 U.S.C. section 360bbb-3(b)(1), unless the authorization is terminated or revoked sooner.  Performed at [facility], [place] [address] [place], [place] [address]   Resp Panel by RT-PCR (Flu A&B, Covid) Nasopharyngeal Swab     Status: None   Collection Time: 03/12/21  1:15 PM   Specimen: Nasopharyngeal Swab; Nasopharyngeal(NP) swabs in vial transport medium  Result Value Ref Range Status   SARS Coronavirus 2 by RT PCR NEGATIVE NEGATIVE Final    Comment: (NOTE) SARS-CoV-2 target nucleic acids are NOT DETECTED.  The SARS-CoV-2 RNA is generally detectable in upper respiratory specimens during the acute phase of infection. The lowest concentration of SARS-CoV-2 viral copies this assay can detect is 138 copies/mL. A negative result does not preclude SARS-Cov-2 infection and should not be used as the sole basis for  treatment or other patient management decisions. A negative result may occur with  improper specimen collection/handling, submission of  specimen other than nasopharyngeal swab, presence of viral mutation(s) within the areas targeted by this assay, and inadequate number of viral copies(<138 copies/mL). A negative result must be combined with clinical observations, patient history, and epidemiological information. The expected result is Negative.  Fact Sheet for Patients:  EntrepreneurPulse.com.au  Fact Sheet for Healthcare Providers:  IncredibleEmployment.be  This test is no t yet approved or cleared by the [place] FDA and  has been authorized for detection and/or diagnosis of SARS-CoV-2 by FDA under an Emergency Use Authorization (EUA). This EUA will remain  in effect (meaning this test can be used) for the duration of the COVID-19 declaration under Section 564(b)(1) of the Act, 21 U.S.C.section 360bbb-3(b)(1), unless the authorization is terminated  or revoked sooner.       Influenza A by PCR NEGATIVE NEGATIVE Final   Influenza B by PCR NEGATIVE NEGATIVE Final    Comment: (NOTE) The Xpert Xpress SARS-CoV-2/FLU/RSV plus assay is intended as an aid in the diagnosis of influenza from Nasopharyngeal swab specimens and should not be used as a sole basis for treatment. Nasal washings and aspirates are unacceptable for Xpert Xpress SARS-CoV-2/FLU/RSV testing.  Fact Sheet for Patients: EntrepreneurPulse.com.au  Fact Sheet for Healthcare Providers: IncredibleEmployment.be  This test is not yet approved or cleared by the [place] FDA and has been authorized for detection and/or diagnosis of SARS-CoV-2 by FDA under an Emergency Use Authorization (EUA). This EUA will remain in effect (meaning this test can be used) for the duration of the COVID-19 declaration under Section 564(b)(1) of the Act, 21 U.S.C. section 360bbb-3(b)(1), unless the authorization is terminated or revoked.  Performed at [facility], [place] [address],  [place], [place] [address]      Labs: BNP (last 3 results) No results for input(s): BNP in the last 8760 hours. Basic Metabolic Panel: Recent Labs  Lab 03/07/21 0306 03/08/21 0508 03/10/21 0537 03/12/21 0419 03/13/21 0847  NA 139 140 142  --  141  K 3.2* 4.1 3.7  --  3.7  CL 102 104 106  --  103  CO2 29 30 30   --  30  GLUCOSE 106* 106* 103*  --  132*  BUN 12 10 11   --  13  CREATININE 0.44 0.54 0.55  --  0.53  CALCIUM 8.6* 8.9 8.5*  --  8.8*  MG  --  2.0 2.3 2.5*  --    Liver Function Tests: No results for input(s): AST, ALT, ALKPHOS, BILITOT, PROT, ALBUMIN in the last 168 hours. No results for input(s): LIPASE, AMYLASE in the last 168 hours. No results for input(s): AMMONIA in the last 168 hours. CBC: Recent Labs  Lab 03/07/21 0306 03/08/21 0508  WBC 4.2 4.9  NEUTROABS 2.9 3.7  HGB 12.7 13.4  HCT 37.7 39.3  MCV 101.6* 101.3*  PLT 286 298   Cardiac Enzymes: No results for input(s): CKTOTAL, CKMB, CKMBINDEX, TROPONINI in the last 168 hours. BNP: Invalid input(s): POCBNP CBG: No results for input(s): GLUCAP in the last 168 hours. D-Dimer No results for input(s): DDIMER in the last 72 hours. Hgb A1c No results for input(s): HGBA1C in the last 72 hours. Lipid Profile No results for input(s): CHOL, HDL, LDLCALC, TRIG, CHOLHDL, LDLDIRECT in the last 72 hours. Thyroid function studies No results for input(s): TSH, T4TOTAL, T3FREE, THYROIDAB in the last 72 hours.  Invalid input(s): FREET3 Anemia  work up No results for input(s): VITAMINB12, FOLATE, FERRITIN, TIBC, IRON, RETICCTPCT in the last 72 hours. Urinalysis    Component Value Date/Time   COLORURINE YELLOW 03/03/2021 2111   APPEARANCEUR CLOUDY (A) 03/03/2021 2111   LABSPEC 1.008 03/03/2021 2111   PHURINE 9.0 (H) 03/03/2021 2111   GLUCOSEU NEGATIVE 03/03/2021 2111   HGBUR NEGATIVE 03/03/2021 2111   BILIRUBINUR NEGATIVE 03/03/2021 2111   KETONESUR NEGATIVE 03/03/2021 2111   PROTEINUR NEGATIVE 03/03/2021 2111    NITRITE NEGATIVE 03/03/2021 2111   LEUKOCYTESUR NEGATIVE 03/03/2021 2111   Sepsis Labs Invalid input(s): PROCALCITONIN,  WBC,  LACTICIDVEN Microbiology Recent Results (from the past 240 hour(s))  Resp Panel by RT-PCR (Flu A&B, Covid) Nasopharyngeal Swab     Status: None   Collection Time: 03/04/21  1:13 AM   Specimen: Nasopharyngeal Swab; Nasopharyngeal(NP) swabs in vial transport medium  Result Value Ref Range Status   SARS Coronavirus 2 by RT PCR NEGATIVE NEGATIVE Final    Comment: (NOTE) SARS-CoV-2 target nucleic acids are NOT DETECTED.  The SARS-CoV-2 RNA is generally detectable in upper respiratory specimens during the acute phase of infection. The lowest concentration of SARS-CoV-2 viral copies this assay can detect is 138 copies/mL. A negative result does not preclude SARS-Cov-2 infection and should not be used as the sole basis for treatment or other patient management decisions. A negative result may occur with  improper specimen collection/handling, submission of specimen other than nasopharyngeal swab, presence of viral mutation(s) within the areas targeted by this assay, and inadequate number of viral copies(<138 copies/mL). A negative result must be combined with clinical observations, patient history, and epidemiological information. The expected result is Negative.  Fact Sheet for Patients:  EntrepreneurPulse.com.au  Fact Sheet for Healthcare Providers:  IncredibleEmployment.be  This test is no t yet approved or cleared by the [place] FDA and  has been authorized for detection and/or diagnosis of SARS-CoV-2 by FDA under an Emergency Use Authorization (EUA). This EUA will remain  in effect (meaning this test can be used) for the duration of the COVID-19 declaration under Section 564(b)(1) of the Act, 21 U.S.C.section 360bbb-3(b)(1), unless the authorization is terminated  or revoked sooner.       Influenza A by PCR  NEGATIVE NEGATIVE Final   Influenza B by PCR NEGATIVE NEGATIVE Final    Comment: (NOTE) The Xpert Xpress SARS-CoV-2/FLU/RSV plus assay is intended as an aid in the diagnosis of influenza from Nasopharyngeal swab specimens and should not be used as a sole basis for treatment. Nasal washings and aspirates are unacceptable for Xpert Xpress SARS-CoV-2/FLU/RSV testing.  Fact Sheet for Patients: EntrepreneurPulse.com.au  Fact Sheet for Healthcare Providers: IncredibleEmployment.be  This test is not yet approved or cleared by the [place] FDA and has been authorized for detection and/or diagnosis of SARS-CoV-2 by FDA under an Emergency Use Authorization (EUA). This EUA will remain in effect (meaning this test can be used) for the duration of the COVID-19 declaration under Section 564(b)(1) of the Act, 21 U.S.C. section 360bbb-3(b)(1), unless the authorization is terminated or revoked.  Performed at [facility], [place] [address], [place], [place] [address]   SARS CORONAVIRUS 2 (TAT 6-24 HRS) Nasopharyngeal Nasopharyngeal Swab     Status: None   Collection Time: 03/08/21  2:30 PM   Specimen: Nasopharyngeal Swab  Result Value Ref Range Status   SARS Coronavirus 2 NEGATIVE NEGATIVE Final    Comment: (NOTE) SARS-CoV-2 target nucleic acids are NOT DETECTED.  The SARS-CoV-2 RNA is generally detectable  in upper and lower respiratory specimens during the acute phase of infection. Negative results do not preclude SARS-CoV-2 infection, do not rule out co-infections with other pathogens, and should not be used as the sole basis for treatment or other patient management decisions. Negative results must be combined with clinical observations, patient history, and epidemiological information. The expected result is Negative.  Fact Sheet for Patients: SugarRoll.be  Fact Sheet for Healthcare  Providers: https://www.woods-mathews.com/  This test is not yet approved or cleared by the [place] FDA and  has been authorized for detection and/or diagnosis of SARS-CoV-2 by FDA under an Emergency Use Authorization (EUA). This EUA will remain  in effect (meaning this test can be used) for the duration of the COVID-19 declaration under Se ction 564(b)(1) of the Act, 21 U.S.C. section 360bbb-3(b)(1), unless the authorization is terminated or revoked sooner.  Performed at [facility], [place] [address] [place], [place] [address]   Resp Panel by RT-PCR (Flu A&B, Covid) Nasopharyngeal Swab     Status: None   Collection Time: 03/12/21  1:15 PM   Specimen: Nasopharyngeal Swab; Nasopharyngeal(NP) swabs in vial transport medium  Result Value Ref Range Status   SARS Coronavirus 2 by RT PCR NEGATIVE NEGATIVE Final    Comment: (NOTE) SARS-CoV-2 target nucleic acids are NOT DETECTED.  The SARS-CoV-2 RNA is generally detectable in upper respiratory specimens during the acute phase of infection. The lowest concentration of SARS-CoV-2 viral copies this assay can detect is 138 copies/mL. A negative result does not preclude SARS-Cov-2 infection and should not be used as the sole basis for treatment or other patient management decisions. A negative result may occur with  improper specimen collection/handling, submission of specimen other than nasopharyngeal swab, presence of viral mutation(s) within the areas targeted by this assay, and inadequate number of viral copies(<138 copies/mL). A negative result must be combined with clinical observations, patient history, and epidemiological information. The expected result is Negative.  Fact Sheet for Patients:  EntrepreneurPulse.com.au  Fact Sheet for Healthcare Providers:  IncredibleEmployment.be  This test is no t yet approved or cleared by the [place] FDA and  has been authorized  for detection and/or diagnosis of SARS-CoV-2 by FDA under an Emergency Use Authorization (EUA). This EUA will remain  in effect (meaning this test can be used) for the duration of the COVID-19 declaration under Section 564(b)(1) of the Act, 21 U.S.C.section 360bbb-3(b)(1), unless the authorization is terminated  or revoked sooner.       Influenza A by PCR NEGATIVE NEGATIVE Final   Influenza B by PCR NEGATIVE NEGATIVE Final    Comment: (NOTE) The Xpert Xpress SARS-CoV-2/FLU/RSV plus assay is intended as an aid in the diagnosis of influenza from Nasopharyngeal swab specimens and should not be used as a sole basis for treatment. Nasal washings and aspirates are unacceptable for Xpert Xpress SARS-CoV-2/FLU/RSV testing.  Fact Sheet for Patients: EntrepreneurPulse.com.au  Fact Sheet for Healthcare Providers: IncredibleEmployment.be  This test is not yet approved or cleared by the [place] FDA and has been authorized for detection and/or diagnosis of SARS-CoV-2 by FDA under an Emergency Use Authorization (EUA). This EUA will remain in effect (meaning this test can be used) for the duration of the COVID-19 declaration under Section 564(b)(1) of the Act, 21 U.S.C. section 360bbb-3(b)(1), unless the authorization is terminated or revoked.  Performed at [facility], [place] [address] [place], [place] [address]      Time coordinating discharge: Over 30 minutes  SIGNED:  Marylu Lund, MD  [facility] 03/13/2021, 11:14 AM  If 7PM-7AM, please contact night-coverage [email or web address]

## 2021-03-12 NOTE — Care Management Important Message (Signed)
Important Message  Patient Details  Name: Connie Chase MRN: [id number] Date of Birth: [birth year]   Medicare Important Message Given:  Yes     Meleana Commerford Montine Circle 03/12/2021, 11:52 AM

## 2021-03-13 ENCOUNTER — Encounter: Payer: Self-pay | Admitting: Internal Medicine

## 2021-03-13 ENCOUNTER — Non-Acute Institutional Stay (SKILLED_NURSING_FACILITY): Payer: Medicare Other | Admitting: Internal Medicine

## 2021-03-13 DIAGNOSIS — I499 Cardiac arrhythmia, unspecified: Secondary | ICD-10-CM | POA: Diagnosis not present

## 2021-03-13 DIAGNOSIS — R0902 Hypoxemia: Secondary | ICD-10-CM | POA: Diagnosis not present

## 2021-03-13 DIAGNOSIS — S2242XG Multiple fractures of ribs, left side, subsequent encounter for fracture with delayed healing: Secondary | ICD-10-CM

## 2021-03-13 DIAGNOSIS — M810 Age-related osteoporosis without current pathological fracture: Secondary | ICD-10-CM | POA: Diagnosis not present

## 2021-03-13 DIAGNOSIS — Z7189 Other specified counseling: Secondary | ICD-10-CM | POA: Diagnosis not present

## 2021-03-13 DIAGNOSIS — Z79899 Other long term (current) drug therapy: Secondary | ICD-10-CM | POA: Diagnosis not present

## 2021-03-13 DIAGNOSIS — M6281 Muscle weakness (generalized): Secondary | ICD-10-CM | POA: Diagnosis not present

## 2021-03-13 DIAGNOSIS — K7460 Unspecified cirrhosis of liver: Secondary | ICD-10-CM | POA: Diagnosis not present

## 2021-03-13 DIAGNOSIS — R569 Unspecified convulsions: Secondary | ICD-10-CM | POA: Diagnosis not present

## 2021-03-13 DIAGNOSIS — G931 Anoxic brain damage, not elsewhere classified: Secondary | ICD-10-CM | POA: Diagnosis not present

## 2021-03-13 DIAGNOSIS — J323 Chronic sphenoidal sinusitis: Secondary | ICD-10-CM | POA: Diagnosis not present

## 2021-03-13 DIAGNOSIS — I4819 Other persistent atrial fibrillation: Secondary | ICD-10-CM | POA: Diagnosis not present

## 2021-03-13 DIAGNOSIS — R2681 Unsteadiness on feet: Secondary | ICD-10-CM | POA: Diagnosis not present

## 2021-03-13 DIAGNOSIS — G20 Parkinson's disease: Secondary | ICD-10-CM | POA: Diagnosis not present

## 2021-03-13 DIAGNOSIS — I4891 Unspecified atrial fibrillation: Secondary | ICD-10-CM | POA: Diagnosis not present

## 2021-03-13 DIAGNOSIS — E43 Unspecified severe protein-calorie malnutrition: Secondary | ICD-10-CM | POA: Diagnosis not present

## 2021-03-13 DIAGNOSIS — T17900A Unspecified foreign body in respiratory tract, part unspecified causing asphyxiation, initial encounter: Secondary | ICD-10-CM | POA: Diagnosis not present

## 2021-03-13 DIAGNOSIS — M47816 Spondylosis without myelopathy or radiculopathy, lumbar region: Secondary | ICD-10-CM | POA: Diagnosis not present

## 2021-03-13 DIAGNOSIS — F419 Anxiety disorder, unspecified: Secondary | ICD-10-CM | POA: Diagnosis present

## 2021-03-13 DIAGNOSIS — R296 Repeated falls: Secondary | ICD-10-CM | POA: Diagnosis not present

## 2021-03-13 DIAGNOSIS — R578 Other shock: Secondary | ICD-10-CM | POA: Diagnosis not present

## 2021-03-13 DIAGNOSIS — J0130 Acute sphenoidal sinusitis, unspecified: Secondary | ICD-10-CM | POA: Diagnosis not present

## 2021-03-13 DIAGNOSIS — Z681 Body mass index (BMI) 19 or less, adult: Secondary | ICD-10-CM | POA: Diagnosis not present

## 2021-03-13 DIAGNOSIS — R279 Unspecified lack of coordination: Secondary | ICD-10-CM | POA: Diagnosis not present

## 2021-03-13 DIAGNOSIS — E876 Hypokalemia: Secondary | ICD-10-CM | POA: Diagnosis not present

## 2021-03-13 DIAGNOSIS — Z66 Do not resuscitate: Secondary | ICD-10-CM | POA: Diagnosis not present

## 2021-03-13 DIAGNOSIS — Z515 Encounter for palliative care: Secondary | ICD-10-CM | POA: Diagnosis not present

## 2021-03-13 DIAGNOSIS — S2242XA Multiple fractures of ribs, left side, initial encounter for closed fracture: Secondary | ICD-10-CM | POA: Diagnosis not present

## 2021-03-13 DIAGNOSIS — S2242XD Multiple fractures of ribs, left side, subsequent encounter for fracture with routine healing: Secondary | ICD-10-CM | POA: Diagnosis not present

## 2021-03-13 DIAGNOSIS — Z741 Need for assistance with personal care: Secondary | ICD-10-CM | POA: Diagnosis not present

## 2021-03-13 DIAGNOSIS — G40409 Other generalized epilepsy and epileptic syndromes, not intractable, without status epilepticus: Secondary | ICD-10-CM | POA: Diagnosis not present

## 2021-03-13 DIAGNOSIS — R410 Disorientation, unspecified: Secondary | ICD-10-CM | POA: Diagnosis not present

## 2021-03-13 DIAGNOSIS — R404 Transient alteration of awareness: Secondary | ICD-10-CM | POA: Diagnosis not present

## 2021-03-13 DIAGNOSIS — E872 Acidosis: Secondary | ICD-10-CM | POA: Diagnosis not present

## 2021-03-13 DIAGNOSIS — Z20822 Contact with and (suspected) exposure to covid-19: Secondary | ICD-10-CM | POA: Diagnosis not present

## 2021-03-13 DIAGNOSIS — I469 Cardiac arrest, cause unspecified: Secondary | ICD-10-CM | POA: Diagnosis not present

## 2021-03-13 DIAGNOSIS — Z743 Need for continuous supervision: Secondary | ICD-10-CM | POA: Diagnosis not present

## 2021-03-13 DIAGNOSIS — I468 Cardiac arrest due to other underlying condition: Secondary | ICD-10-CM | POA: Diagnosis not present

## 2021-03-13 DIAGNOSIS — J942 Hemothorax: Secondary | ICD-10-CM

## 2021-03-13 DIAGNOSIS — R6521 Severe sepsis with septic shock: Secondary | ICD-10-CM | POA: Diagnosis not present

## 2021-03-13 DIAGNOSIS — Z452 Encounter for adjustment and management of vascular access device: Secondary | ICD-10-CM | POA: Diagnosis not present

## 2021-03-13 DIAGNOSIS — A419 Sepsis, unspecified organism: Secondary | ICD-10-CM | POA: Diagnosis not present

## 2021-03-13 DIAGNOSIS — G40909 Epilepsy, unspecified, not intractable, without status epilepticus: Secondary | ICD-10-CM | POA: Diagnosis present

## 2021-03-13 DIAGNOSIS — R7303 Prediabetes: Secondary | ICD-10-CM | POA: Diagnosis not present

## 2021-03-13 DIAGNOSIS — B181 Chronic viral hepatitis B without delta-agent: Secondary | ICD-10-CM | POA: Diagnosis present

## 2021-03-13 DIAGNOSIS — J690 Pneumonitis due to inhalation of food and vomit: Secondary | ICD-10-CM | POA: Diagnosis not present

## 2021-03-13 DIAGNOSIS — I4510 Unspecified right bundle-branch block: Secondary | ICD-10-CM | POA: Diagnosis not present

## 2021-03-13 DIAGNOSIS — G119 Hereditary ataxia, unspecified: Secondary | ICD-10-CM | POA: Diagnosis present

## 2021-03-13 DIAGNOSIS — R6889 Other general symptoms and signs: Secondary | ICD-10-CM | POA: Diagnosis not present

## 2021-03-13 DIAGNOSIS — S0990XA Unspecified injury of head, initial encounter: Secondary | ICD-10-CM | POA: Diagnosis not present

## 2021-03-13 LAB — BASIC METABOLIC PANEL
Anion gap: 8 (ref 5–15)
BUN: 13 mg/dL (ref 8–23)
CO2: 30 mmol/L (ref 22–32)
Calcium: 8.8 mg/dL — ABNORMAL LOW (ref 8.9–10.3)
Chloride: 103 mmol/L (ref 98–111)
Creatinine, Ser: 0.53 mg/dL (ref 0.44–1.00)
GFR, Estimated: >60 mL/min (ref >60)
Glucose, Bld: 132 mg/dL — ABNORMAL HIGH (ref 70–99)
Potassium: 3.7 mmol/L (ref 3.5–5.1)
Sodium: 141 mmol/L (ref 135–145)

## 2021-03-13 MED ORDER — LABETALOL HCL 5 MG/ML IV SOLN
INTRAVENOUS | Status: AC
  Administered 2021-03-13: 5 mg via INTRAVENOUS
  Filled 2021-03-13: qty 4

## 2021-03-13 MED ORDER — LABETALOL HCL 5 MG/ML IV SOLN: 5.0000 mg | INTRAVENOUS | Status: DC | PRN

## 2021-03-13 MED ORDER — POTASSIUM CHLORIDE CRYS ER 20 MEQ PO TBCR
40.0000 meq | EXTENDED_RELEASE_TABLET | Freq: Once | ORAL | Status: AC
  Administered 2021-03-13: 40 meq via ORAL
  Filled 2021-03-13: qty 2

## 2021-03-13 NOTE — Assessment & Plan Note (Signed)
She denies active respiratory symptoms at this time.

## 2021-03-13 NOTE — Assessment & Plan Note (Addendum)
PT/OT @ SNF Neurology reassessment will be pursued.  The husband indicates that there has been no elevation of CK and myasthenia gravis has been ruled out. No recent CK or rheumatologic lab results in Epic ( sed rate 18 in 2015). Such appropriate if not previously pursued as muscle wasting is generalized & striking. Possibly even muscle biopsy a consideration.

## 2021-03-13 NOTE — Progress Notes (Signed)
Attempted to call [facility] twice to provide any additional report that may be needed (report given prior day). No answer. IV site to be removed upon PT[facility] arrival. AVS updated and ready for collection. Patient and family made aware awaiting transportation.

## 2021-03-13 NOTE — Patient Instructions (Signed)
See assessment and plan under each diagnosis in the problem list and acutely for this visit 

## 2021-03-13 NOTE — TOC Progression Note (Addendum)
Transition of Care [facility]) - Progression Note    Patient Details  Name: Connie Chase MRN: [id number] Date of Birth: 1946/04/11  Transition of Care [facility] CM/SW [place], RN Phone Number: [phone]  03/13/2021, 9:49 AM  Clinical Narrative:    Cm attempting to follow up on patient that was discharged yesterday and not transported to [facility]. CM has spoken with unit director to follow up on reason patient was not discharged. Unit director to follow up with charting. CM spoke with MD covering patient today. MD is not sure if patient is discharging today states that he will need to assess patient to determine if patient can discharge today. CM will continue to follow.  1200 Per MD during progression. Patient is ok to discharge. Transport has been set up with PT[facility]. Bedside nurse has been made aware. Patient will go to same room as previously assigned. See note from 5/3 for info to call report. TOC to sign off.    Barriers to Discharge: No Barriers Identified  Expected Discharge Plan and Services           Expected Discharge Date: 03/12/21               DME Arranged: N/A DME Agency: NA       HH Arranged: NA HH Agency: NA         Social Determinants of Health (SDOH) Interventions    Readmission Risk Interventions No flowsheet data found.

## 2021-03-13 NOTE — Progress Notes (Signed)
PT Cancellation Note  Patient Details Name: Connie Chase MRN: [id number] DOB: 02-Dec-1945   Cancelled Treatment:    Reason Eval/Treat Not Completed: Other (comment) - pt awaiting [facility] transport to SNF, will defer treatment.  Mabeline Caras, PT, DPT Acute Rehabilitation Services  Pager [phone] Office Okay 03/13/2021, 12:21 PM

## 2021-03-13 NOTE — Assessment & Plan Note (Addendum)
Nutrition consult at SNF.  During hospitalization total protein  low at 5.6 and albumin 2.9.

## 2021-03-13 NOTE — Assessment & Plan Note (Addendum)
Neurology follow-up will probably be with [facility] here in [place]

## 2021-03-13 NOTE — Progress Notes (Addendum)
MD notified that pt HR went into 180's and O2 sats decreased to 79% on ra. Pt placed on 2L O2. MD asked if ekg was desired-per MD not at this time/keep pt on tele monitor. Called tele for strip at that time frame reports RVR.

## 2021-03-13 NOTE — Progress Notes (Signed)
PROGRESS NOTE    Tempie Gibeault  ATF:[id number] DOB: [birth year] DOA: 03/03/2021 PCP: Jonathon Jordan, MD    Brief Narrative:  75 y.o.femalewith Past medical history of chronic A. 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  Also has A. fib with RVR and severe constipation. CT C-spine, MRI brain, CT head unremarkable for any acute abnormality. Trauma surgery saw her and signed off. Status post left-sided thoracentesis with 600 cc removal on 03/06/2021.  PT OT recommends SNF.  Patient's anticoagulation has been stopped since she has risk of more spontaneous bleeding due to recurrent fall  Assessment & Plan:   Principal Problem:   Hemopneumothorax on left Active Problems:   Persistent atrial fibrillation (HCC)   Multiple rib fractures   Fall at home, initial encounter   Chronic anticoagulation   Protein-calorie malnutrition, severe  1.  Recurrent fall. Patient reports gait disorder with recurrent fall, she tells that her legs give out. She has extensive evaluation including EMG done at [facility]. Neurology was consulted for further assistance CT C-spine, MRI brain, CT head unremarkable for any acute abnormality. Currently no further inpatient work-up, outpatient follow-up recommended.  Neurology signed off.  PT recommended SNF   2.  Right small apical pneumothorax. Left hemopneumothorax Left multiple rib fractures, acute, multiple other chronic rib fractures. Trauma surgery signed off.  Status post left-sided thoracentesis with 600 cc removal on 03/06/2021.  3.  Chronic A. fib with RVR Patient is on 30 mg Cardizem breakfast and lunch and 120 mg Cardizem CD at bedtime. She was supposed to be on Tikosyn but since she identified that she remained in A. fib despite  being on Tikosyn she stopped taking the medicine on her own somewhere in the beginning of 2022. Continues to take anticoagulation at home.  Cardizem CD increased to 240 mg nightly on 03/09/2021 and continues to be on short acting Cardizem 30 mg twice a day.  Heart rate finally controlled.   4.  Chronic anticoagulation with recurrent fall Anticoagulation is stopped due to history and risk of recurrent falls. Plan start on a low-dose aspirin once she is more stable.  5.  Severe constipation: Resolved.  6.  Sternal fracture.  Chronic appearing RVR. X-ray shows subacute versus chronic appearing lower xiphoid fracture as well. Patient does not have any symptoms of chest pain there. Echocardiogram reassuring, normal EF, no pericardial effusion.  7.  Hypokalemia. Resolved.  8.  Underweight/cachexia. Dietary consulted. Currently on regular diet.  Also on MVI.  Monitor. Body mass index is 14.56 kg/m.  Nutrition Problem: Severe Malnutrition  9.  Right scalp injury. From her prior fall but it actually had mild oozing during her current fall as well. No further work-up recommended as there is no acute bleeding or swelling or acute abnormality on the MRI brain with and without contrast.  10.  Dilated CBD. Incidentally seen on the CT scan.  Currently no evidence of acute abdominal pain or symptoms. Also LFTs are stable. No further work-up.  Antimicrobials: Anti-infectives (From admission, onward)   Start     Dose/Rate Route Frequency Ordered Stop   03/05/21 0700  entecavir (BARACLUDE) tablet 1 mg  Status:  Discontinued        1 mg Oral Every morning 03/04/21 1408 03/08/21 0825       Subjective: Eager to go to  rehab  Objective: Vitals:   03/13/21 0807 03/13/21 0815 03/13/21 0915 03/13/21 0930  BP:  122/71 111/65 116/60  Pulse:  (!) 102 95 100  Resp:  (!) 21 20 17   Temp: 98.5 F (36.9 C)     TempSrc: Oral     SpO2:  96% 97% 95%  Weight:      Height:         Intake/Output Summary (Last 24 hours) at 03/13/2021 1114 Last data filed at 03/13/2021 [id number] Gross per 24 hour  Intake 120 ml  Output 1400 ml  Net -1280 ml   Filed Weights   03/04/21 0325 03/09/21 0528  Weight: 38.8 kg 36.7 kg    Examination: General exam: Awake, laying in bed, in nad Respiratory system: Normal respiratory effort, no wheezing Cardiovascular system: regular rate, s1, s2 Gastrointestinal system: Soft, nondistended, positive BS Central nervous system: CN2-12 grossly intact, strength intact Extremities: Perfused, no clubbing Skin: Normal skin turgor, no notable skin lesions seen Psychiatry: Mood normal // no visual hallucinations    Data Reviewed: I have personally reviewed following labs and imaging studies  CBC: Recent Labs  Lab 03/07/21 0306 03/08/21 0508  WBC 4.2 4.9  NEUTROABS 2.9 3.7  HGB 12.7 13.4  HCT 37.7 39.3  MCV 101.6* 101.3*  PLT 286 [id number]   Basic Metabolic Panel: Recent Labs  Lab 03/07/21 0306 03/08/21 0508 03/10/21 0537 03/12/21 0419 03/13/21 0847  NA 139 140 142  --  141  K 3.2* 4.1 3.7  --  3.7  CL 102 104 106  --  103  CO2 29 30 30   --  30  GLUCOSE 106* 106* 103*  --  132*  BUN 12 10 11   --  13  CREATININE 0.44 0.54 0.55  --  0.53  CALCIUM 8.6* 8.9 8.5*  --  8.8*  MG  --  2.0 2.3 2.5*  --    GFR: Estimated Creatinine Clearance: 35.2 mL/min (by C-G formula based on SCr of 0.53 mg/dL). Liver Function Tests: No results for input(s): AST, ALT, ALKPHOS, BILITOT, PROT, ALBUMIN in the last 168 hours. No results for input(s): LIPASE, AMYLASE in the last 168 hours. No results for input(s): AMMONIA in the last 168 hours. Coagulation Profile: No results for input(s): INR, PROTIME in the last 168 hours. Cardiac Enzymes: No results for input(s): CKTOTAL, CKMB, CKMBINDEX, TROPONINI in the last 168 hours. BNP (last 3 results) No results for input(s): PROBNP in the last 8760 hours. HbA1C: No results for input(s): HGBA1C in the last 72  hours. CBG: No results for input(s): GLUCAP in the last 168 hours. Lipid Profile: No results for input(s): CHOL, HDL, LDLCALC, TRIG, CHOLHDL, LDLDIRECT in the last 72 hours. Thyroid Function Tests: No results for input(s): TSH, T4TOTAL, FREET4, T3FREE, THYROIDAB in the last 72 hours. Anemia Panel: No results for input(s): VITAMINB12, FOLATE, FERRITIN, TIBC, IRON, RETICCTPCT in the last 72 hours. Sepsis Labs: No results for input(s): PROCALCITON, LATICACIDVEN in the last 168 hours.  Recent Results (from the past 240 hour(s))  Resp Panel by RT-PCR (Flu A&B, Covid) Nasopharyngeal Swab     Status: None   Collection Time: 03/04/21  1:13 AM   Specimen: Nasopharyngeal Swab; Nasopharyngeal(NP) swabs in vial transport medium  Result Value Ref Range Status   SARS Coronavirus 2 by RT PCR NEGATIVE NEGATIVE Final    Comment: (NOTE) SARS-CoV-2 target nucleic acids are NOT DETECTED.  The SARS-CoV-2 RNA is generally detectable in upper respiratory specimens during the acute phase of infection.  The lowest concentration of SARS-CoV-2 viral copies this assay can detect is 138 copies/mL. A negative result does not preclude SARS-Cov-2 infection and should not be used as the sole basis for treatment or other patient management decisions. A negative result may occur with  improper specimen collection/handling, submission of specimen other than nasopharyngeal swab, presence of viral mutation(s) within the areas targeted by this assay, and inadequate number of viral copies(<138 copies/mL). A negative result must be combined with clinical observations, patient history, and epidemiological information. The expected result is Negative.  Fact Sheet for Patients:  EntrepreneurPulse.com.au  Fact Sheet for Healthcare Providers:  IncredibleEmployment.be  This test is no t yet approved or cleared by the [place] FDA and  has been authorized for detection and/or  diagnosis of SARS-CoV-2 by FDA under an Emergency Use Authorization (EUA). This EUA will remain  in effect (meaning this test can be used) for the duration of the COVID-19 declaration under Section 564(b)(1) of the Act, 21 U.S.C.section 360bbb-3(b)(1), unless the authorization is terminated  or revoked sooner.       Influenza A by PCR NEGATIVE NEGATIVE Final   Influenza B by PCR NEGATIVE NEGATIVE Final    Comment: (NOTE) The Xpert Xpress SARS-CoV-2/FLU/RSV plus assay is intended as an aid in the diagnosis of influenza from Nasopharyngeal swab specimens and should not be used as a sole basis for treatment. Nasal washings and aspirates are unacceptable for Xpert Xpress SARS-CoV-2/FLU/RSV testing.  Fact Sheet for Patients: EntrepreneurPulse.com.au  Fact Sheet for Healthcare Providers: IncredibleEmployment.be  This test is not yet approved or cleared by the [place] FDA and has been authorized for detection and/or diagnosis of SARS-CoV-2 by FDA under an Emergency Use Authorization (EUA). This EUA will remain in effect (meaning this test can be used) for the duration of the COVID-19 declaration under Section 564(b)(1) of the Act, 21 U.S.C. section 360bbb-3(b)(1), unless the authorization is terminated or revoked.  Performed at [facility], [place] [address], [place], [place] [address]   SARS CORONAVIRUS 2 (TAT 6-24 HRS) Nasopharyngeal Nasopharyngeal Swab     Status: None   Collection Time: 03/08/21  2:30 PM   Specimen: Nasopharyngeal Swab  Result Value Ref Range Status   SARS Coronavirus 2 NEGATIVE NEGATIVE Final    Comment: (NOTE) SARS-CoV-2 target nucleic acids are NOT DETECTED.  The SARS-CoV-2 RNA is generally detectable in upper and lower respiratory specimens during the acute phase of infection. Negative results do not preclude SARS-CoV-2 infection, do not rule out co-infections with other pathogens, and should not be used as  the sole basis for treatment or other patient management decisions. Negative results must be combined with clinical observations, patient history, and epidemiological information. The expected result is Negative.  Fact Sheet for Patients: SugarRoll.be  Fact Sheet for Healthcare Providers: https://www.woods-mathews.com/  This test is not yet approved or cleared by the [place] FDA and  has been authorized for detection and/or diagnosis of SARS-CoV-2 by FDA under an Emergency Use Authorization (EUA). This EUA will remain  in effect (meaning this test can be used) for the duration of the COVID-19 declaration under Se ction 564(b)(1) of the Act, 21 U.S.C. section 360bbb-3(b)(1), unless the authorization is terminated or revoked sooner.  Performed at [facility], [place] [address] [place], [place] [address]   Resp Panel by RT-PCR (Flu A&B, Covid) Nasopharyngeal Swab     Status: None   Collection Time: 03/12/21  1:15 PM   Specimen: Nasopharyngeal Swab; Nasopharyngeal(NP) swabs  in vial transport medium  Result Value Ref Range Status   SARS Coronavirus 2 by RT PCR NEGATIVE NEGATIVE Final    Comment: (NOTE) SARS-CoV-2 target nucleic acids are NOT DETECTED.  The SARS-CoV-2 RNA is generally detectable in upper respiratory specimens during the acute phase of infection. The lowest concentration of SARS-CoV-2 viral copies this assay can detect is 138 copies/mL. A negative result does not preclude SARS-Cov-2 infection and should not be used as the sole basis for treatment or other patient management decisions. A negative result may occur with  improper specimen collection/handling, submission of specimen other than nasopharyngeal swab, presence of viral mutation(s) within the areas targeted by this assay, and inadequate number of viral copies(<138 copies/mL). A negative result must be combined with clinical observations, patient history, and  epidemiological information. The expected result is Negative.  Fact Sheet for Patients:  EntrepreneurPulse.com.au  Fact Sheet for Healthcare Providers:  IncredibleEmployment.be  This test is no t yet approved or cleared by the [place] FDA and  has been authorized for detection and/or diagnosis of SARS-CoV-2 by FDA under an Emergency Use Authorization (EUA). This EUA will remain  in effect (meaning this test can be used) for the duration of the COVID-19 declaration under Section 564(b)(1) of the Act, 21 U.S.C.section 360bbb-3(b)(1), unless the authorization is terminated  or revoked sooner.       Influenza A by PCR NEGATIVE NEGATIVE Final   Influenza B by PCR NEGATIVE NEGATIVE Final    Comment: (NOTE) The Xpert Xpress SARS-CoV-2/FLU/RSV plus assay is intended as an aid in the diagnosis of influenza from Nasopharyngeal swab specimens and should not be used as a sole basis for treatment. Nasal washings and aspirates are unacceptable for Xpert Xpress SARS-CoV-2/FLU/RSV testing.  Fact Sheet for Patients: EntrepreneurPulse.com.au  Fact Sheet for Healthcare Providers: IncredibleEmployment.be  This test is not yet approved or cleared by the [place] FDA and has been authorized for detection and/or diagnosis of SARS-CoV-2 by FDA under an Emergency Use Authorization (EUA). This EUA will remain in effect (meaning this test can be used) for the duration of the COVID-19 declaration under Section 564(b)(1) of the Act, 21 U.S.C. section 360bbb-3(b)(1), unless the authorization is terminated or revoked.  Performed at [facility], [place] [address] [place], [place] [address]      Radiology Studies: No results found.  Scheduled Meds: . diltiazem  240 mg Oral QHS  . diltiazem  30 mg Oral BID  . docusate sodium  100 mg Oral BID  . dorzolamide  1 drop Both Eyes BID  . multivitamin with minerals  1  tablet Oral Daily  . polyethylene glycol  17 g Oral Daily  . potassium chloride  40 mEq Oral Once   Continuous Infusions:   LOS: 9 days   Marylu Lund, MD [facility] Pager On Amion  If 7PM-7AM, please contact night-coverage 03/13/2021, 11:14 AM

## 2021-03-13 NOTE — Progress Notes (Signed)
Nutrition Follow-up  DOCUMENTATION CODES:   Severe malnutrition in context of chronic illness  INTERVENTION:  -Continue regular diet -Continue MVI with minerals daily  NUTRITION DIAGNOSIS:   Severe Malnutrition related to chronic illness (cirrhosis) as evidenced by severe muscle depletion,severe fat depletion. - ongoing  GOAL:   Patient will meet greater than or equal to 90% of their needs - progressing  MONITOR:   PO intake,Labs,Weight trends,I & O's  REASON FOR ASSESSMENT:   Consult,Malnutrition Screening Tool Assessment of nutrition requirement/status  ASSESSMENT:   Pt with PMH significant for Afib, cirrhosis, chronic viral hepatitis B without delta-agent, and osteoporosis presented for evaluation of L chest wall pain with shortness of breath with exertion. Pt had experienced multiple falls over the last 4-5 days PTA 2/2 progressive weakness and difficulty with ambulation. Pt found to have L rib fractures 5-10 and hemopneumothorax.   Pt was supposed to discharge to SNF yesterday; room number even assigned. RN reports pt's HR went into 180s and O2 sats decreased to 79% on RA. Pt kept to remain on tele monitor.   PO Intake: 0-100% x 5 recorded meals (72% average meal intake). Pt refusing supplements.   UOP: 1.4L x24 hours  Medications: colace, mvi with minerals, miralax Labs: Mg 2.5 (H, result is from 5/3)  Diet Order:   Diet Order            Diet regular Room service appropriate? Yes with Assist; Fluid consistency: Thin  Diet effective now                 EDUCATION NEEDS:   No education needs have been identified at this time  Skin:  Skin Assessment: Reviewed RN Assessment  Last BM:  5/3 type 4  Height:   Ht Readings from Last 1 Encounters:  03/04/21 5' 2.5" (1.588 m)    Weight:   Wt Readings from Last 1 Encounters:  03/09/21 36.7 kg    BMI:  Body mass index is 14.56 kg/m.  Estimated Nutritional Needs:   Kcal:  1350-1550  Protein:   65-80g  Fluid:  >1.35L    Larkin Ina, MS, RD, LDN RD pager number and weekend/on-call pager number located in [place].

## 2021-03-13 NOTE — Assessment & Plan Note (Signed)
Rate is adequately controlled at this time

## 2021-03-13 NOTE — Progress Notes (Signed)
Eye drops- Trusopt noted not in pt bin. Pt husband arrived to room. Pt and family asked had they retrieved any eye drops. Pt and family stated no. Pt asked had they received eye drops last night. Pt stated no. Documentation noted pt received eye drops at 2058 on 5/3. Pt and family made aware. Pt husband stated he was unaware. Pt husband stated well I was giving her eye drops and the nurse yanked it away. Patient husband made aware that this nurse was the nurse that received eye drops back from patient husband and assured this nurse did not "yank" eye drops away as pt husband placed in the nurses open palms up hand. Pt husband then stated, that "I don't know, y'all all look alike". Noted this nurse wearing protective head gear. Pt and family made aware that will request eye drops from pharmacy.

## 2021-03-13 NOTE — Progress Notes (Signed)
NURSING HOME LOCATION:  [facility] ROOM NUMBER:  [id number]  CODE STATUS:  FULL  PCP:  Jonathon Jordan, MD  This is a comprehensive admission note to this SNFperformed on this date less than 30 days from date of admission. Included are preadmission medical/surgical history; reconciled medication list; family history; social history and comprehensive review of systems.  Corrections and additions to the records were documented. Comprehensive physical exam was also performed. Additionally a clinical summary was entered for each active diagnosis pertinent to this admission in the Problem List to enhance continuity of care.  HPI: Patient was hospitalized 4/24 - 03/13/2021 admitted with recurrent falls in the context of chronic gait disorder.  Imaging revealed a left apical pneumothorax, multiple left rib fractures.  A. fib with RVR was present.  CT cervical spine, MRI of the brain, and CT of the head were unremarkable for any acute injuries.  Trauma surgery consulted ; left-sided thoracentesis was completed with 600 cc removed on 4/27.  Despite the A. Fib, after discussion with the family anticoagulation was stopped since she has risk of more traumatic bleeding due to recurrent falls.  Neurology evaluated her for these and her generalized weakness.  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  Electrolyte abnormalities were corrected.  Nutrition was consulted because of her underweight, cachectic state. The day prior to discharge she became hypoxic and required 2 L of oxygen.  A. fib with RVR was problematic as heart rate would climb into the 150s especially with meal intake.  Cardizem CD was increased gradually from 120 mg nightly to 240 mg with short acting Cardizem during day as per family request. PT/OT recommended CIR placement but insurance company declined this despite peer to peer review; therefore, she was discharged to the SNF for  rehab.  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 Surgeries and procedures include multiple cardioversions and radiofrequency ablation.  Social history: Nondrinker; never smoked. She & her husband are originally from [place].  Family history: Limited history reviewed.   Review of systems: Despite chronic AF,she denies any cardiac or neurologic prodrome prior to the falls of which she has had at least 4.  She denies significant pain in the left rib cage unless there is palpation of this area.  She states that she has had progressive weight loss of at least 20 pounds in the last 2 years associated with profound muscle wasting.  She had seen Dr. Tedra Coupe, Neurologist at [facility] for cerebellar ataxia and possible Parkinson's picture.  She states that parkinsonian medications actually were of no benefit.   She describes constipation. She has urinary frequency in the context of increased fluid intakes.  She denies polydipsia, polyphagia, or polyuria.  Constitutional: No fever  Eyes: No redness, discharge, pain, vision change ENT/mouth: No nasal congestion, purulent discharge, earache, change in hearing, sore throat  Cardiovascular: No paroxysmal nocturnal dyspnea, claudication, edema  Respiratory: No cough, sputum production, hemoptysis, DOE, significant snoring, apnea  Gastrointestinal: No heartburn, dysphagia, abdominal pain, nausea /vomiting, rectal bleeding, melena Genitourinary: No dysuria, hematuria, pyuria, incontinence, nocturia Musculoskeletal: No joint stiffness, joint swelling, pain Dermatologic: No rash, pruritus, change in appearance of skin Neurologic: No dizziness, headache, syncope, seizures, numbness, tingling Psychiatric: No significant anxiety, depression, insomnia, anorexia Endocrine: No change in  hair/skin/nails Hematologic/lymphatic: No significant bruising, lymphadenopathy, abnormal bleeding Allergy/immunology: No itchy/watery eyes,  significant sneezing, urticaria, angioedema  Physical exam:  Pertinent or positive findings: She appears frankly cachectic with profound wasting of the limbs.  There is esotropia on the left with slight ptosis.  There is suggestion of faint hirsutism over the upper lip.  The left nasolabial fold was slightly decreased.  The lower teeth are stained.  Heart sounds are distant and  rhythm is irregular.  Breath sounds are decreased.  There is a dressing over the left shin.  She is profoundly weak to opposition.  Flexion contractures of the fifth digit is present bilaterally.  General appearance: no acute distress, increased work of breathing is present.   Lymphatic: No lymphadenopathy about the head, neck, axilla. Eyes: No conjunctival inflammation or lid edema is present. There is no scleral icterus. Ears:  External ear exam shows no significant lesions or deformities.   Nose:  External nasal examination shows no deformity or inflammation. Nasal mucosa are pink and moist without lesions, exudates Neck:  No thyromegaly, masses, tenderness noted.    Heart:  No gallop, murmur, click, rub.  Lungs: without wheezes, rhonchi, rales, rubs. Abdomen: Bowel sounds are normal.  Abdomen is soft and nontender with no organomegaly, hernias, masses. GU: Deferred  Extremities:  No cyanosis, clubbing, edema. Neurologic exam: Balance, Rhomberg, finger to nose testing could not be completed due to clinical state Skin: Warm & dry w/o tenting. No significant rash.  See clinical summary under each active problem in the Problem List with associated updated therapeutic plan

## 2021-03-13 NOTE — Plan of Care (Signed)

## 2021-03-14 NOTE — Assessment & Plan Note (Signed)
She denies significant pain unless area palpated

## 2021-03-16 LAB — VITAMIN B1: Vitamin B1 (Thiamine): 120.4 nmol/L (ref 66.5–200.0)

## 2021-03-17 ENCOUNTER — Encounter (HOSPITAL_COMMUNITY): Payer: Self-pay

## 2021-03-17 ENCOUNTER — Inpatient Hospital Stay (HOSPITAL_COMMUNITY): Payer: Medicare Other

## 2021-03-17 ENCOUNTER — Inpatient Hospital Stay (HOSPITAL_COMMUNITY)
Admission: EM | Admit: 2021-03-17 | Discharge: 2021-04-10 | DRG: 870 | Disposition: E | Payer: Medicare Other | Attending: Pulmonary Disease | Admitting: Pulmonary Disease

## 2021-03-17 ENCOUNTER — Emergency Department (HOSPITAL_COMMUNITY): Payer: Medicare Other

## 2021-03-17 DIAGNOSIS — I469 Cardiac arrest, cause unspecified: Secondary | ICD-10-CM | POA: Diagnosis not present

## 2021-03-17 DIAGNOSIS — T17900A Unspecified foreign body in respiratory tract, part unspecified causing asphyxiation, initial encounter: Secondary | ICD-10-CM

## 2021-03-17 DIAGNOSIS — I4819 Other persistent atrial fibrillation: Secondary | ICD-10-CM | POA: Diagnosis present

## 2021-03-17 DIAGNOSIS — R404 Transient alteration of awareness: Secondary | ICD-10-CM | POA: Diagnosis not present

## 2021-03-17 DIAGNOSIS — B181 Chronic viral hepatitis B without delta-agent: Secondary | ICD-10-CM | POA: Diagnosis present

## 2021-03-17 DIAGNOSIS — E876 Hypokalemia: Secondary | ICD-10-CM | POA: Diagnosis not present

## 2021-03-17 DIAGNOSIS — A419 Sepsis, unspecified organism: Secondary | ICD-10-CM | POA: Diagnosis not present

## 2021-03-17 DIAGNOSIS — J0130 Acute sphenoidal sinusitis, unspecified: Secondary | ICD-10-CM | POA: Diagnosis not present

## 2021-03-17 DIAGNOSIS — Z20822 Contact with and (suspected) exposure to covid-19: Secondary | ICD-10-CM | POA: Diagnosis not present

## 2021-03-17 DIAGNOSIS — S0990XA Unspecified injury of head, initial encounter: Secondary | ICD-10-CM | POA: Diagnosis not present

## 2021-03-17 DIAGNOSIS — G40909 Epilepsy, unspecified, not intractable, without status epilepticus: Secondary | ICD-10-CM | POA: Diagnosis present

## 2021-03-17 DIAGNOSIS — J690 Pneumonitis due to inhalation of food and vomit: Secondary | ICD-10-CM | POA: Diagnosis not present

## 2021-03-17 DIAGNOSIS — I4510 Unspecified right bundle-branch block: Secondary | ICD-10-CM | POA: Diagnosis not present

## 2021-03-17 DIAGNOSIS — Z4659 Encounter for fitting and adjustment of other gastrointestinal appliance and device: Secondary | ICD-10-CM

## 2021-03-17 DIAGNOSIS — E43 Unspecified severe protein-calorie malnutrition: Secondary | ICD-10-CM | POA: Diagnosis not present

## 2021-03-17 DIAGNOSIS — M47816 Spondylosis without myelopathy or radiculopathy, lumbar region: Secondary | ICD-10-CM | POA: Diagnosis not present

## 2021-03-17 DIAGNOSIS — G40409 Other generalized epilepsy and epileptic syndromes, not intractable, without status epilepticus: Secondary | ICD-10-CM

## 2021-03-17 DIAGNOSIS — R0902 Hypoxemia: Secondary | ICD-10-CM | POA: Diagnosis present

## 2021-03-17 DIAGNOSIS — R6521 Severe sepsis with septic shock: Secondary | ICD-10-CM | POA: Diagnosis not present

## 2021-03-17 DIAGNOSIS — Z515 Encounter for palliative care: Secondary | ICD-10-CM | POA: Diagnosis not present

## 2021-03-17 DIAGNOSIS — R569 Unspecified convulsions: Secondary | ICD-10-CM | POA: Diagnosis not present

## 2021-03-17 DIAGNOSIS — R7303 Prediabetes: Secondary | ICD-10-CM | POA: Diagnosis present

## 2021-03-17 DIAGNOSIS — I4891 Unspecified atrial fibrillation: Secondary | ICD-10-CM | POA: Diagnosis not present

## 2021-03-17 DIAGNOSIS — Z66 Do not resuscitate: Secondary | ICD-10-CM | POA: Diagnosis not present

## 2021-03-17 DIAGNOSIS — R578 Other shock: Secondary | ICD-10-CM | POA: Diagnosis not present

## 2021-03-17 DIAGNOSIS — G319 Degenerative disease of nervous system, unspecified: Secondary | ICD-10-CM | POA: Diagnosis not present

## 2021-03-17 DIAGNOSIS — Z743 Need for continuous supervision: Secondary | ICD-10-CM | POA: Diagnosis not present

## 2021-03-17 DIAGNOSIS — I468 Cardiac arrest due to other underlying condition: Secondary | ICD-10-CM | POA: Diagnosis not present

## 2021-03-17 DIAGNOSIS — K7460 Unspecified cirrhosis of liver: Secondary | ICD-10-CM | POA: Diagnosis present

## 2021-03-17 DIAGNOSIS — G20 Parkinson's disease: Secondary | ICD-10-CM | POA: Diagnosis present

## 2021-03-17 DIAGNOSIS — J323 Chronic sphenoidal sinusitis: Secondary | ICD-10-CM | POA: Diagnosis not present

## 2021-03-17 DIAGNOSIS — G931 Anoxic brain damage, not elsewhere classified: Secondary | ICD-10-CM | POA: Diagnosis present

## 2021-03-17 DIAGNOSIS — F419 Anxiety disorder, unspecified: Secondary | ICD-10-CM | POA: Diagnosis present

## 2021-03-17 DIAGNOSIS — E872 Acidosis: Secondary | ICD-10-CM | POA: Diagnosis present

## 2021-03-17 DIAGNOSIS — I499 Cardiac arrhythmia, unspecified: Secondary | ICD-10-CM | POA: Diagnosis not present

## 2021-03-17 DIAGNOSIS — Z7189 Other specified counseling: Secondary | ICD-10-CM | POA: Diagnosis not present

## 2021-03-17 DIAGNOSIS — Z79899 Other long term (current) drug therapy: Secondary | ICD-10-CM

## 2021-03-17 DIAGNOSIS — M810 Age-related osteoporosis without current pathological fracture: Secondary | ICD-10-CM | POA: Diagnosis not present

## 2021-03-17 DIAGNOSIS — Z681 Body mass index (BMI) 19 or less, adult: Secondary | ICD-10-CM

## 2021-03-17 DIAGNOSIS — G119 Hereditary ataxia, unspecified: Secondary | ICD-10-CM | POA: Diagnosis present

## 2021-03-17 DIAGNOSIS — R6889 Other general symptoms and signs: Secondary | ICD-10-CM | POA: Diagnosis not present

## 2021-03-17 DIAGNOSIS — Z452 Encounter for adjustment and management of vascular access device: Secondary | ICD-10-CM | POA: Diagnosis not present

## 2021-03-17 DIAGNOSIS — I639 Cerebral infarction, unspecified: Secondary | ICD-10-CM | POA: Diagnosis not present

## 2021-03-17 LAB — GLUCOSE, CAPILLARY
Glucose-Capillary: 249 mg/dL — ABNORMAL HIGH (ref 70–99)
Glucose-Capillary: 204 mg/dL — ABNORMAL HIGH (ref 70–99)
Glucose-Capillary: 136 mg/dL — ABNORMAL HIGH (ref 70–99)
Glucose-Capillary: 181 mg/dL — ABNORMAL HIGH (ref 70–99)

## 2021-03-17 LAB — PHOSPHORUS
Phosphorus: 2.6 mg/dL (ref 2.5–4.6)
Phosphorus: 1.6 mg/dL — ABNORMAL LOW (ref 2.5–4.6)

## 2021-03-17 LAB — CBG MONITORING, ED: Glucose-Capillary: 308 mg/dL — ABNORMAL HIGH (ref 70–99)

## 2021-03-17 LAB — COMPREHENSIVE METABOLIC PANEL
ALT: 150 U/L — ABNORMAL HIGH (ref 0–44)
AST: 258 U/L — ABNORMAL HIGH (ref 15–41)
Albumin: 3 g/dL — ABNORMAL LOW (ref 3.5–5.0)
Alkaline Phosphatase: 135 U/L — ABNORMAL HIGH (ref 38–126)
Anion gap: 20 — ABNORMAL HIGH (ref 5–15)
BUN: 20 mg/dL (ref 8–23)
CO2: 13 mmol/L — ABNORMAL LOW (ref 22–32)
Calcium: 8.1 mg/dL — ABNORMAL LOW (ref 8.9–10.3)
Chloride: 111 mmol/L (ref 98–111)
Creatinine, Ser: 0.85 mg/dL (ref 0.44–1.00)
GFR, Estimated: >60 mL/min (ref >60)
Glucose, Bld: 319 mg/dL — ABNORMAL HIGH (ref 70–99)
Potassium: 4 mmol/L (ref 3.5–5.1)
Sodium: 144 mmol/L (ref 135–145)
Total Bilirubin: 0.8 mg/dL (ref 0.3–1.2)
Total Protein: 5.9 g/dL — ABNORMAL LOW (ref 6.5–8.1)

## 2021-03-17 LAB — CBC WITH DIFFERENTIAL/PLATELET
Abs Immature Granulocytes: 0 10*3/uL (ref 0.00–0.07)
Basophils Absolute: 0 10*3/uL (ref 0.0–0.1)
Basophils Relative: 0 %
Eosinophils Absolute: 0 10*3/uL (ref 0.0–0.5)
Eosinophils Relative: 0 %
HCT: 48.2 % — ABNORMAL HIGH (ref 36.0–46.0)
Hemoglobin: 14.4 g/dL (ref 12.0–15.0)
Lymphocytes Relative: 12 %
Lymphs Abs: 1 10*3/uL (ref 0.7–4.0)
MCH: 34.1 pg — ABNORMAL HIGH (ref 26.0–34.0)
MCHC: 29.9 g/dL — ABNORMAL LOW (ref 30.0–36.0)
MCV: 114.2 fL — ABNORMAL HIGH (ref 80.0–100.0)
Monocytes Absolute: 0.7 10*3/uL (ref 0.1–1.0)
Monocytes Relative: 8 %
Neutro Abs: 6.7 10*3/uL (ref 1.7–7.7)
Neutrophils Relative %: 80 %
Platelets: 255 10*3/uL (ref 150–400)
RBC: 4.22 MIL/uL (ref 3.87–5.11)
RDW: 13.2 % (ref 11.5–15.5)
WBC: 8.4 10*3/uL (ref 4.0–10.5)
nRBC: 0 % (ref 0.0–0.2)
nRBC: 0 /100 WBC

## 2021-03-17 LAB — I-STAT ARTERIAL BLOOD GAS, ED
Acid-base deficit: 9 mmol/L — ABNORMAL HIGH (ref 0.0–2.0)
Bicarbonate: 19 mmol/L — ABNORMAL LOW (ref 20.0–28.0)
Calcium, Ion: 1.08 mmol/L — ABNORMAL LOW (ref 1.15–1.40)
HCT: 39 % (ref 36.0–46.0)
Hemoglobin: 13.3 g/dL (ref 12.0–15.0)
O2 Saturation: 100 %
Potassium: 3.2 mmol/L — ABNORMAL LOW (ref 3.5–5.1)
Sodium: 147 mmol/L — ABNORMAL HIGH (ref 135–145)
TCO2: 20 mmol/L — ABNORMAL LOW (ref 22–32)
pCO2 arterial: 46.3 mmHg (ref 32.0–48.0)
pH, Arterial: 7.221 — ABNORMAL LOW (ref 7.350–7.450)
pO2, Arterial: 406 mmHg — ABNORMAL HIGH (ref 83.0–108.0)

## 2021-03-17 LAB — MAGNESIUM
Magnesium: 1.9 mg/dL (ref 1.7–2.4)
Magnesium: 1.9 mg/dL (ref 1.7–2.4)

## 2021-03-17 LAB — LACTIC ACID, PLASMA
Lactic Acid, Venous: 8.2 mmol/L (ref 0.5–1.9)
Lactic Acid, Venous: 3.6 mmol/L (ref 0.5–1.9)

## 2021-03-17 LAB — MRSA PCR SCREENING: MRSA by PCR: NEGATIVE

## 2021-03-17 LAB — TROPONIN I (HIGH SENSITIVITY)
Troponin I (High Sensitivity): 97 ng/L — ABNORMAL HIGH (ref <18)
Troponin I (High Sensitivity): 394 ng/L (ref <18)

## 2021-03-17 LAB — VITAMIN B12: Vitamin B-12: 1480 pg/mL — ABNORMAL HIGH (ref 180–914)

## 2021-03-17 MED ORDER — DOCUSATE SODIUM 100 MG PO CAPS: 100.0000 mg | ORAL_CAPSULE | Freq: Two times a day (BID) | ORAL | Status: DC | PRN

## 2021-03-17 MED ORDER — SODIUM CHLORIDE 0.9 % IV SOLN
3.0000 g | Freq: Three times a day (TID) | INTRAVENOUS | Status: DC
Start: 2021-03-17 — End: 2021-03-21
  Administered 2021-03-17 – 2021-03-21 (×11): 3 g via INTRAVENOUS
  Filled 2021-03-17 (×11): qty 8

## 2021-03-17 MED ORDER — ONDANSETRON HCL 4 MG/2ML IJ SOLN: 4.0000 mg | Freq: Four times a day (QID) | INTRAMUSCULAR | Status: DC | PRN

## 2021-03-17 MED ORDER — LEVETIRACETAM IN NACL 1000 MG/100ML IV SOLN
1000.0000 mg | Freq: Two times a day (BID) | INTRAVENOUS | Status: DC
  Administered 2021-03-17 – 2021-03-21 (×9): 1000 mg via INTRAVENOUS
  Filled 2021-03-17 (×10): qty 100

## 2021-03-17 MED ORDER — LACTATED RINGERS IV SOLN: INTRAVENOUS | Status: AC

## 2021-03-17 MED ORDER — SODIUM CHLORIDE 0.9 % IV SOLN
3.0000 g | Freq: Once | INTRAVENOUS | Status: AC
  Administered 2021-03-17: 3 g via INTRAVENOUS

## 2021-03-17 MED ORDER — FENTANYL CITRATE (PF) 100 MCG/2ML IJ SOLN: 25.0000 ug | Freq: Once | INTRAMUSCULAR | Status: DC

## 2021-03-17 MED ORDER — POTASSIUM PHOSPHATES 15 MMOLE/5ML IV SOLN
20.0000 mmol | Freq: Once | INTRAVENOUS | Status: AC
  Administered 2021-03-17: 20 mmol via INTRAVENOUS
  Filled 2021-03-17: qty 6.67

## 2021-03-17 MED ORDER — NOREPINEPHRINE 4 MG/250ML-% IV SOLN
INTRAVENOUS | Status: AC
  Administered 2021-03-17: 8 ug/min via INTRAVENOUS
  Filled 2021-03-17: qty 250

## 2021-03-17 MED ORDER — ROCURONIUM BROMIDE 50 MG/5ML IV SOLN
INTRAVENOUS | Status: AC | PRN
  Administered 2021-03-17: 70 mg via INTRAVENOUS

## 2021-03-17 MED ORDER — CHLORHEXIDINE GLUCONATE 0.12% ORAL RINSE (MEDLINE KIT)
15.0000 mL | Freq: Two times a day (BID) | OROMUCOSAL | Status: DC
  Administered 2021-03-17 – 2021-03-21 (×8): 15 mL via OROMUCOSAL

## 2021-03-17 MED ORDER — PROSOURCE TF PO LIQD
45.0000 mL | Freq: Two times a day (BID) | ORAL | Status: DC
  Administered 2021-03-17 – 2021-03-18 (×3): 45 mL
  Filled 2021-03-17 (×4): qty 45

## 2021-03-17 MED ORDER — NOREPINEPHRINE 4 MG/250ML-% IV SOLN
0.0000 ug/min | INTRAVENOUS | Status: DC
  Administered 2021-03-17: 10 ug/min via INTRAVENOUS

## 2021-03-17 MED ORDER — FENTANYL 2500MCG IN NS 250ML (10MCG/ML) PREMIX INFUSION
25.0000 ug/h | INTRAVENOUS | Status: DC
  Administered 2021-03-19: 25 ug/h via INTRAVENOUS
  Filled 2021-03-17: qty 250

## 2021-03-17 MED ORDER — DOCUSATE SODIUM 50 MG/5ML PO LIQD: 100.0000 mg | Freq: Two times a day (BID) | ORAL | Status: DC | PRN

## 2021-03-17 MED ORDER — ENOXAPARIN SODIUM 30 MG/0.3ML IJ SOSY
30.0000 mg | PREFILLED_SYRINGE | INTRAMUSCULAR | Status: DC
  Administered 2021-03-18 – 2021-03-20 (×3): 30 mg via SUBCUTANEOUS
  Filled 2021-03-17 (×4): qty 0.3

## 2021-03-17 MED ORDER — NOREPINEPHRINE 4 MG/250ML-% IV SOLN
2.0000 ug/min | INTRAVENOUS | Status: DC
  Administered 2021-03-17: 2 ug/min via INTRAVENOUS
  Administered 2021-03-18: 4 ug/min via INTRAVENOUS
  Administered 2021-03-18: 6 ug/min via INTRAVENOUS
  Administered 2021-03-20: 2 ug/min via INTRAVENOUS
  Filled 2021-03-17 (×4): qty 250

## 2021-03-17 MED ORDER — INSULIN ASPART 100 UNIT/ML IJ SOLN
2.0000 [IU] | INTRAMUSCULAR | Status: DC
  Administered 2021-03-17: 4 [IU] via SUBCUTANEOUS
  Administered 2021-03-17: 6 [IU] via SUBCUTANEOUS
  Administered 2021-03-17: 2 [IU] via SUBCUTANEOUS
  Administered 2021-03-17: 6 [IU] via SUBCUTANEOUS
  Administered 2021-03-18 (×2): 2 [IU] via SUBCUTANEOUS
  Administered 2021-03-18: 4 [IU] via SUBCUTANEOUS
  Administered 2021-03-18: 6 [IU] via SUBCUTANEOUS
  Administered 2021-03-18: 4 [IU] via SUBCUTANEOUS
  Administered 2021-03-19 (×2): 2 [IU] via SUBCUTANEOUS
  Administered 2021-03-19: 4 [IU] via SUBCUTANEOUS
  Administered 2021-03-19: 2 [IU] via SUBCUTANEOUS
  Administered 2021-03-20 (×2): 4 [IU] via SUBCUTANEOUS
  Administered 2021-03-20: 2 [IU] via SUBCUTANEOUS
  Administered 2021-03-20 – 2021-03-21 (×2): 4 [IU] via SUBCUTANEOUS
  Administered 2021-03-21: 2 [IU] via SUBCUTANEOUS

## 2021-03-17 MED ORDER — PANTOPRAZOLE SODIUM 40 MG IV SOLR
40.0000 mg | Freq: Every day | INTRAVENOUS | Status: DC
  Administered 2021-03-17 – 2021-03-19 (×3): 40 mg via INTRAVENOUS
  Filled 2021-03-17 (×3): qty 40

## 2021-03-17 MED ORDER — MIDAZOLAM HCL 2 MG/2ML IJ SOLN
INTRAMUSCULAR | Status: AC
  Administered 2021-03-17: 5 mg via INTRAVENOUS
  Filled 2021-03-17: qty 6

## 2021-03-17 MED ORDER — LACTATED RINGERS IV BOLUS
1000.0000 mL | Freq: Once | INTRAVENOUS | Status: AC
  Administered 2021-03-17: 1000 mL via INTRAVENOUS

## 2021-03-17 MED ORDER — PROPOFOL 1000 MG/100ML IV EMUL
0.0000 ug/kg/min | INTRAVENOUS | Status: DC
  Administered 2021-03-17: 20 ug/kg/min via INTRAVENOUS
  Administered 2021-03-18: 30 ug/kg/min via INTRAVENOUS
  Administered 2021-03-18 – 2021-03-20 (×4): 50 ug/kg/min via INTRAVENOUS
  Administered 2021-03-20: 30 ug/kg/min via INTRAVENOUS
  Administered 2021-03-20: 50 ug/kg/min via INTRAVENOUS
  Administered 2021-03-21: 30 ug/kg/min via INTRAVENOUS
  Filled 2021-03-17 (×10): qty 100

## 2021-03-17 MED ORDER — ORAL CARE MOUTH RINSE
15.0000 mL | OROMUCOSAL | Status: DC
  Administered 2021-03-17 – 2021-03-21 (×41): 15 mL via OROMUCOSAL

## 2021-03-17 MED ORDER — MIDAZOLAM HCL 2 MG/2ML IJ SOLN: 5.0000 mg | Freq: Once | INTRAMUSCULAR | Status: AC

## 2021-03-17 MED ORDER — FENTANYL BOLUS VIA INFUSION
25.0000 ug | INTRAVENOUS | Status: DC | PRN
  Administered 2021-03-21: 50 ug via INTRAVENOUS
  Filled 2021-03-17: qty 100

## 2021-03-17 MED ORDER — SODIUM CHLORIDE 0.9 % IV SOLN
250.0000 mL | INTRAVENOUS | Status: DC
  Administered 2021-03-19: 250 mL via INTRAVENOUS

## 2021-03-17 MED ORDER — SODIUM CHLORIDE 0.9 % IV SOLN
3.0000 g | Freq: Once | INTRAVENOUS | Status: DC
  Filled 2021-03-17 (×2): qty 8

## 2021-03-17 MED ORDER — VITAL HIGH PROTEIN PO LIQD
1000.0000 mL | ORAL | Status: DC
  Administered 2021-03-17: 1000 mL
  Filled 2021-03-17: qty 1000

## 2021-03-17 MED ORDER — DOCUSATE SODIUM 50 MG/5ML PO LIQD
100.0000 mg | Freq: Two times a day (BID) | ORAL | Status: DC
  Administered 2021-03-18 – 2021-03-20 (×4): 100 mg
  Filled 2021-03-17 (×5): qty 10

## 2021-03-17 MED ORDER — POLYETHYLENE GLYCOL 3350 17 G PO PACK
17.0000 g | PACK | Freq: Every day | ORAL | Status: DC
  Filled 2021-03-17: qty 1

## 2021-03-17 MED ORDER — ETOMIDATE 2 MG/ML IV SOLN
INTRAVENOUS | Status: AC | PRN
  Administered 2021-03-17: 20 mg via INTRAVENOUS

## 2021-03-17 MED ORDER — POLYETHYLENE GLYCOL 3350 17 G PO PACK: 17.0000 g | PACK | Freq: Every day | ORAL | Status: DC | PRN

## 2021-03-17 MED ORDER — LACTATED RINGERS IV SOLN: INTRAVENOUS | Status: DC

## 2021-03-17 NOTE — ED Notes (Signed)
MD at bedside attempting intubation

## 2021-03-17 NOTE — Progress Notes (Signed)
[facility] Progress Note Patient Name: Connie Chase DOB: September 23, 1946 MRN: [id number]   Date of Service  Apr 12, 2021  HPI/Events of Note  RN asks about temperature target in this patient. Per Dr. Michelle Piper note, target is normothermia. T 37.6 C at present. More than 10 hours post-arrest.   eICU Interventions  Apply cooling blanket.     Intervention Category Minor Interventions: Other:  Charlott Rakes 2021/04/12, 9:44 PM

## 2021-03-17 NOTE — Procedures (Addendum)
Patient Name: Connie Chase  MRN: [id number]  Epilepsy Attending: Lora Havens  Referring Physician/Provider: Dr Kipp Brood Date: 2021-04-11 Duration: 24.31 mins  Patient history: 75yo F s/p cardiac arrest. EEG to evaluate for seizure  Level of alertness:  comatose  AEDs during EEG study: Propofol, LEV  Technical aspects: This EEG study was done with scalp electrodes positioned according to the 10-20 International system of electrode placement. Electrical activity was acquired at a sampling rate of 500Hz  and reviewed with a high frequency filter of 70Hz  and a low frequency filter of 1Hz . EEG data were recorded continuously and digitally stored.   Description: Episodes of spontaneous eye opening were noted every few minutes. Concomitant eeg showed burst of generalized polyspikes lasting 3-5 seconds consistent with myoclonic seizure. EEG also showed near continuous generalized background suppression. EEG was not reactive to tactile stimulation.  Hyperventilation and photic stimulation were not performed.     ABNORMALITY - Myoclonic seizure, generalized - Background suppression, generalized  IMPRESSION: This study showed myoclonic seizures every few minutes as we all profound diffuse encephalopathy. 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  Dr Lynetta Mare was notified.  Rithvik Orcutt [name]

## 2021-03-17 NOTE — Progress Notes (Signed)
EEG completed, LTM next - results pending

## 2021-03-17 NOTE — ED Notes (Signed)
At this time floor is unable to take report, they would like to give her 15 mins

## 2021-03-17 NOTE — Progress Notes (Signed)
EEG attempted, but  CT first per nurse.

## 2021-03-17 NOTE — ED Provider Notes (Signed)
[facility] EMERGENCY DEPARTMENT Provider Note   CSN: [id number] Arrival date & time: 04-15-2021  1045     History No chief complaint on file.   Connie Chase is a 75 y.o. female.  HPI Patient brought by EMS.  She was eating with her husband and choked on food.  Patient's husband was present.  CPR was initiated.  On EMS arrival patient was in PEA.  Resuscitation initiated.  Food was identified in the airway.  A King airway placed.  EMS obtained return of pulses and epinephrine drip initiated.  Cumulatively, after review EMS estimates 20+ minutes of downtime before return of pulses.  Patient has been unresponsive.    Past Medical History:  Diagnosis Date  . 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 Chronic viral hepatitis B without delta-agent ([place])   . Cirrhosis of liver ([place])   . Fibroid 2004, 2016  . Hemorrhage anterior chamber eye    On pradaxa  . Hepatitis B infection    Chronic Hep B carrier, on suppressive Tx  . Osteoarthritis        . Osteoporosis   . Persistent atrial fibrillation ([facility])    a. s/p DCCV b. intolerant of Flecainide  . Prediabetes     Patient Active Problem List   Diagnosis Date Noted  . Aspiration pneumonitis ([place]) 2021-04-15  . Recurrent falls 03/13/2021  . Protein-calorie malnutrition, severe 03/05/2021  . Multiple rib fractures 03/04/2021  . Hemopneumothorax on left 03/04/2021  . Fall at home, initial encounter 03/04/2021  . Chronic anticoagulation 03/04/2021  . Parkinsonism ([place]) 06/14/2018  . Persistent atrial fibrillation ([place]) 03/27/2015  . Alopecia 02/09/2013  . Palpitations 06/13/2011  . Chest pain 06/13/2011  . ATRIAL FIBRILLATION 12/31/2009  . FATIGUE 12/31/2009    Past Surgical History:  Procedure Laterality Date  . ABLATION  09/06/2015  . CARDIAC ELECTROPHYSIOLOGY MAPPING AND ABLATION  06/2015  . CARDIOVERSION  04/02/2012   Procedure: CARDIOVERSION;  Surgeon: Deboraha Sprang, MD;  Location: [facility];  Service:  Cardiovascular;  Laterality: N/A;  . CARDIOVERSION N/A 09/12/2013   Procedure: CARDIOVERSION;  Surgeon: Deboraha Sprang, MD;  Location: [facility];  Service: Cardiovascular;  Laterality: N/A;  . CARDIOVERSION N/A 01/26/2014   Procedure: CARDIOVERSION;  Surgeon: Deboraha Sprang, MD;  Location: [facility] ENDOSCOPY;  Service: Cardiovascular;  Laterality: N/A;  . CARDIOVERSION N/A 03/30/2015   Procedure: CARDIOVERSION;  Surgeon: Thompson Grayer, MD;  Location: [facility] ENDOSCOPY;  Service: Cardiovascular;  Laterality: N/A;  . CATARACT EXTRACTION W/ INTRAOCULAR LENS IMPLANT Left 03/2011  . CATARACT EXTRACTION W/ INTRAOCULAR LENS IMPLANT Right 2013  . IR THORACENTESIS ASP PLEURAL SPACE W/IMG GUIDE  03/06/2021  . RADIOFREQUENCY ABLATION       OB History    Gravida  2   Para  2   Term      Preterm      AB      Living  2     SAB      IAB      Ectopic      Multiple      Live Births              Family History  Problem Relation Age of Onset  . Osteoporosis Mother   . Other Other        Neg cardiac family Hx  . Neuromuscular disorder Neg Hx     Social History   Tobacco Use  . Smoking status: Never Smoker  . Smokeless tobacco:  Never Used  Vaping Use  . Vaping Use: Never used  Substance Use Topics  . Alcohol use: No    Alcohol/week: 0.0 standard drinks  . Drug use: No    Home Medications Prior to Admission medications   Medication Sig Start Date End Date Taking? Authorizing Provider  b complex vitamins capsule Take 1 capsule by mouth daily.    [provider]  bisacodyl (DULCOLAX) 10 MG suppository Place 10 mg rectally as needed for moderate constipation.    [provider]  cholecalciferol (VITAMIN D) 1000 UNITS tablet Take 1,000 Units by mouth daily with lunch.    [provider]  Cholecalciferol (VITAMIN D) 50 MCG (2000 UT) tablet Take 2,000 Units by mouth every morning.    [provider]  diltiazem (CARDIZEM CD) 240 MG 24 hr capsule Take 1  capsule (240 mg total) by mouth at bedtime. 03/12/21 04/11/21  Darliss Cheney, MD  diltiazem (CARDIZEM) 30 MG tablet Take 30 mg by mouth 2 (two) times daily. Morning and lunch 10/22/16   [provider]  dorzolamide (TRUSOPT) 2 % ophthalmic solution Place 1 drop into both eyes 2 (two) times daily.  05/09/15   [provider]  entecavir (BARACLUDE) 1 MG tablet Take 1 mg by mouth in the morning.    [provider]  KLOR-CON M20 20 MEQ tablet TAKE 1 TABLET (20 MEQ TOTAL) BY MOUTH DAILY. Patient taking differently: Take 20 mEq by mouth daily. 02/12/16   Deboraha Sprang, MD  Magnesium Hydroxide (MILK OF MAGNESIA PO) Take by mouth.    [provider]  magnesium oxide (MAG-OX) 400 MG tablet Take 400 mg by mouth at bedtime. 06/18/15   [provider]  Multiple Vitamins-Minerals (ICAPS AREDS 2 PO) Take 1 tablet by mouth every morning.    [provider]  Omega-3 Fatty Acids (OMEGA 3 PO) Take 1 capsule by mouth 2 (two) times daily.     [provider]  Sodium Phosphates (RA SALINE ENEMA RE) Place rectally.    [provider]  UNABLE TO FIND 120 mLs 2 (two) times daily. Med Pass    [provider]  UNABLE TO FIND at bedtime. Magic cup    [provider]    Allergies    Fosamax [alendronate]  Review of Systems   Review of Systems Level 5 caveat cannot obtain review of systems due to patient condition Physical Exam Updated Vital Signs BP 116/72   Pulse (!) 101   Temp (!) 97.5 F (36.4 C) (Rectal)   Resp 15   Ht 5\' 3"  (1.6 m)   LMP 11/10/2006 (Approximate)   SpO2 100%   BMI 14.33 kg/m   Physical Exam Constitutional:      Comments: Patient is completely unresponsive.  She has been bagged by EMS with [facility] airway.  HENT:     Head: Normocephalic and atraumatic.     Mouth/Throat:     Comments: Upon intubation, extensive food particulate matter in the posterior airway. Eyes:     Comments: Pupils midrange and fixed.   Cardiovascular:     Rate and Rhythm: Normal rate and regular rhythm.  Pulmonary:     Comments: Coarse symmetric breath sounds with a cane. Abdominal:     General: There is no distension.     Palpations: Abdomen is soft.  Musculoskeletal:     Comments: No obvious extremity trauma.  Skin:    Comments: Skin is pale and warm  Neurological:  Comments: Patient is completely unresponsive to pain stimulus.  No spontaneous movements.     ED Results / Procedures / Treatments   Labs (all labs ordered are listed, but only abnormal results are displayed) Labs Reviewed  CBC WITH DIFFERENTIAL/PLATELET - Abnormal; Notable for the following components:      Result Value   HCT 48.2 (*)    MCV 114.2 (*)    MCH 34.1 (*)    MCHC 29.9 (*)    All other components within normal limits  CBG MONITORING, ED - Abnormal; Notable for the following components:   Glucose-Capillary 308 (*)    All other components within normal limits  CULTURE, RESPIRATORY W GRAM STAIN  COMPREHENSIVE METABOLIC PANEL  LACTIC ACID, PLASMA  LACTIC ACID, PLASMA  BLOOD GAS, ARTERIAL  CBC  CREATININE, SERUM  TROPONIN I (HIGH SENSITIVITY)    EKG EKG Interpretation  Date/Time:  Sunday Mar 17 2021 10:49:38 EDT Ventricular Rate:  126 PR Interval:    QRS Duration: 118 QT Interval:  358 QTC Calculation: 519 R Axis:   134 Text Interpretation: Atrial fibrillation RBBB and LPFB Low voltage, extremity leads Confirmed by Thayer Jew [phone]) on 03/18/2021 11:02:20 AM   Radiology No results found.  Procedures Procedure Name: Intubation Date/Time: 03/28/2021 7:02 AM Performed by: Charlesetta Shanks, MD Pre-anesthesia Checklist: Patient identified, Patient being monitored, Emergency Drugs available and Suction available Oxygen Delivery Method: Ambu bag Preoxygenation: Pre-oxygenation with 100% oxygen Induction Type: Rapid sequence Laryngoscope Size: Glidescope Grade View: Grade I Tube size: 7.5 mm Number of attempts:  1 Placement Confirmation: ETT inserted through vocal cords under direct vision,  CO2 detector and Breath sounds checked- equal and bilateral Tube secured with: ETT holder Dental Injury: Teeth and Oropharynx as per pre-operative assessment  Comments: Patient intubated with glide scope.  King airway in place.  Patient was ventilated with [facility] airway until intubation.  King airway removed and airway visualized.  Extensive food particulate matter around the vallecula.  The glottis is open with no visual food material in it.  ET tube placed without difficulty.       CRITICAL CARE Performed by: Charlesetta Shanks   Total critical care time: 45 minutes  Critical care time was exclusive of separately billable procedures and treating other patients.  Critical care was necessary to treat or prevent imminent or life-threatening deterioration.  Critical care was time spent personally by me on the following activities: development of treatment plan with patient and/or surrogate as well as nursing, discussions with consultants, evaluation of patient's response to treatment, examination of patient, obtaining history from patient or surrogate, ordering and performing treatments and interventions, ordering and review of laboratory studies, ordering and review of radiographic studies, pulse oximetry and re-evaluation of patient's condition. Medications Ordered in ED Medications  docusate sodium (COLACE) capsule 100 mg (has no administration in time range)  polyethylene glycol (MIRALAX / GLYCOLAX) packet 17 g (has no administration in time range)  enoxaparin (LOVENOX) injection 40 mg (0 mg Subcutaneous Hold 03/16/2021 1155)  ondansetron (ZOFRAN) injection 4 mg (has no administration in time range)  pantoprazole (PROTONIX) injection 40 mg (has no administration in time range)  0.9 %  sodium chloride infusion (0 mLs Intravenous Hold 04/03/2021 1156)  norepinephrine (LEVOPHED) 4mg  in 277mL premix infusion (has no  administration in time range)  lactated ringers infusion (has no administration in time range)    Followed by  lactated ringers infusion (has no administration in time range)  feeding supplement (VITAL HIGH PROTEIN) liquid 1,000  mL (has no administration in time range)  feeding supplement (PROSource TF) liquid 45 mL (has no administration in time range)  etomidate (AMIDATE) injection (20 mg Intravenous Given 03/24/2021 1053)  rocuronium (ZEMURON) injection (70 mg Intravenous Given 04/09/2021 1054)  lactated ringers bolus 1,000 mL (0 mLs Intravenous Stopped 03/31/2021 1156)  midazolam (VERSED) injection 5 mg (5 mg Intravenous Given 03/12/2021 1125)    ED Course  I have reviewed the triage vital signs and the nursing notes.  Pertinent labs & imaging results that were available during my care of the patient were reviewed by me and considered in my medical decision making (see chart for details).    MDM Rules/Calculators/A&P                          Consult: Critical care for admission.  Patient presents after choking on food material foreign body. she was resuscitated in the field with return of pulses after prolonged PEA.  Patient had no spontaneous neurologic responses.  King airway removed and patient formally intubated without difficulty.  Patient mated to critical care service for ongoing management. Final Clinical Impression(s) / ED Diagnoses Final diagnoses:  Choking due to foreign body, initial encounter  Cardiopulmonary arrest [facility])    Rx / [place] Orders ED Discharge Orders    None       Charlesetta Shanks, MD 03/28/21 [phone]

## 2021-03-17 NOTE — Progress Notes (Signed)
Transported pt down to Ct scan and back without complications.

## 2021-03-17 NOTE — Progress Notes (Signed)
[facility] Progress Note Patient Name: Connie Chase DOB: February 17, 1946 MRN: [id number]   Date of Service  03/25/2021  HPI/Events of Note  Phos 1.6, K 3.2. S/p arrest. Cr increasing.  eICU Interventions  Ordered potassium phosphate 20 mmol IV.     Intervention Category Minor Interventions: Electrolytes abnormality - evaluation and management  Charlott Rakes 03/24/2021, 8:53 PM

## 2021-03-17 NOTE — Progress Notes (Signed)
Pharmacy Antibiotic Note  Tiahna Okray is a 75 y.o. female admitted on 04-15-21 post CPR.  Pharmacy has been consulted for Unasyn dosing for aspiration PNA. WBC wnl. Hypothermic. Lactic Acid 8.2. Current CrCl 33 ml/min.   Plan: Unasyn 3g x1 followed by 3g IV q8h Monitor renal function, cultures, and clinical progression  Height: 5\' 3"  (160 cm) IBW/kg (Calculated) : 52.4  Temp (24hrs), Avg:97.5 F (36.4 C), Min:97.5 F (36.4 C), Max:97.5 F (36.4 C)  Recent Labs  Lab 03/13/21 0847 04-15-2021 1056 April 15, 2021 1103  WBC  --  8.4  --   CREATININE 0.53 0.85  --   LATICACIDVEN  --   --  8.2*    Estimated Creatinine Clearance: 33.1 mL/min (by C-G formula based on SCr of 0.85 mg/dL).    Allergies  Allergen Reactions  . Fosamax [Alendronate]     Other reaction(s): Muscle and joint pain    Antimicrobials this admission: Unasyn 5/8 >>  Dose adjustments this admission: N/a  Microbiology results: 5/8 TA:  Thank you for allowing pharmacy to be a part of this patient's care.  Cristela Felt, PharmD Clinical Pharmacist  04-15-2021 1:23 PM

## 2021-03-17 NOTE — H&P (Signed)
NAME:  Connie Chase, MRN:  [id number], DOB:  10-Dec-1945, LOS: 0 ADMISSION DATE:  04/05/21, CONSULTATION DATE:  Apr 05, 2021 REFERRING MD:  Johnney Killian, ED [facility], CHIEF COMPLAINT:  Post cardiac arrest.    History of Present Illness:  75 year old woman who suffered a cardiac arrest secondary to suffocation due to food aspiration while eating today.  Immediate bystander CPR.  ROSC after 20 minutes of resuscitation in the field.  Intubated in the ED for airway protection minimal extension to pain prior to sedation administration.  Food particles visible in posterior valleculae.  Patient has an ill-defined neurological process for which she was in rehabilitation and apparently improving.  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  According to her husband, she had occasion to choke on her food particular if she was eating fast.  Her speech had also become somewhat  dysarthric  Pertinent  Medical History   Past Medical History:  Diagnosis Date  . 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 Chronic viral hepatitis B without delta-agent ([place])   . Cirrhosis of liver ([place])   . Fibroid 2004, 2016  . Hemorrhage anterior chamber eye    On pradaxa  . Hepatitis B infection    Chronic Hep B carrier, on suppressive Tx  . Osteoarthritis        . Osteoporosis   . Persistent atrial fibrillation (HCC)    a. s/p DCCV b. intolerant of Flecainide  . Prediabetes      Significant Hospital Events: Including procedures, antibiotic start and stop dates in addition to other pertinent events   . 5/8-admitted from [facility] rehabilitation.  Interim History / Subjective:  Began to have stimulus induced myoclonic jerks on arrival in the ICU.  Objective   Blood pressure 124/82, pulse 63, temperature (!) 97.5 F (36.4 C), temperature source Rectal, resp.  rate 20, height 5\' 3"  (1.6 m), last menstrual period 11/10/2006, SpO2 100 %.    Vent Mode: PRVC FiO2 (%):  [60 %-100 %] 60 % Set Rate:  [16 bmp-20 bmp] 20 bmp Vt Set:  [420 mL] 420 mL PEEP:  [5 cmH20] 5 cmH20   Intake/Output Summary (Last 24 hours) at 2021/04/05 1422 Last data filed at 04/05/21 1400 Gross per 24 hour  Intake 64.84 ml  Output --  Net 64.84 ml   There were no vitals filed for this visit.  Examination: General: Intubated unresponsive on no sedation. HENT: No scleral icterus.  Temporalis wasting. Lungs: Mechanically ventilated.  Chest is clear bilaterally. Cardiovascular: No JVD.  Apex beat is not displaced or sustained.  Heart sounds are unremarkable.  There is no peripheral edema.  Peripheral pulses are intact. Abdomen: Scaphoid.  Soft and nontender. Extremities: Marked muscle atrophy in all muscle groups. Neuro: Sedated at the time of examination, no response to painful stimulation.  Cranial nerve reflexes absent. GU: Foley catheter in place.  Labs/imaging that I havepersonally reviewed  (right click and "Reselect all SmartList Selections" daily)  Metabolic acidosis on arrival.  Mild transaminitis.  Initial lactate 8.2-all are suggestive of prolonged arrest. CBC is normal from elevated MCV Chest x-ray shows increased interstitial markings on the left side consistent with aspiration  Resolved Hospital Problem list   None.  Assessment & Plan:  Critically ill due to cardiac arrest requiring mechanical ventilation for airway protection. 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  Plan:  -Titrate norepinephrine to keep MAP greater than 65, fluid resuscitation. -Initiate normothermia postcardiac arrest care.  48 hours of sedation, followed by 48 hours of drug washout prior to  neuro prognostication at 96 hours. -Titrate propofol to control myoclonus.  Add Keppra. -Continue full ventilatory support.  At risk for developing ARDS. -Unasyn for antibiotic coverage.  Prognosis from cardiac arrest is guarded particular in the context of ill-defined but progressive neurological disorder.  I have informed the husband of this.  Palliative care consultation requested.  Best practice (right click and "Reselect all SmartList Selections" daily)  Diet:  Tube Feed  Pain/Anxiety/Delirium protocol (if indicated): Yes (RASS goal -3,-4) VAP protocol (if indicated): Yes DVT prophylaxis: LMWH GI prophylaxis: PPI Glucose control:  SSI Yes Central venous access:  N/A Arterial line:  N/A Foley:  Yes, and it is still needed Mobility:  bed rest  PT consulted: N/A Last date of multidisciplinary goals of care discussion [husband updated at bedside as above 5/8] Code Status:  full code Disposition: ICU  Labs   CBC: Recent Labs  Lab 04/08/2021 1056 04/09/2021 1208  WBC 8.4  --   NEUTROABS 6.7  --   HGB 14.4 13.3  HCT 48.2* 39.0  MCV 114.2*  --   PLT 255  --     Basic Metabolic Panel: Recent Labs  Lab 03/12/21 0419 03/13/21 0847 04/04/2021 1056 03/30/2021 1208  NA  --  141 144 147*  K  --  3.7 4.0 3.2*  CL  --  103 111  --   CO2  --  30 13*  --   GLUCOSE  --  132* 319*  --   BUN  --  13 20  --   CREATININE  --  0.53 0.85  --   CALCIUM  --  8.8* 8.1*  --   MG 2.5*  --   --   --    GFR: Estimated Creatinine Clearance: 33.1 mL/min (by C-G formula based on SCr of 0.85 mg/dL). Recent Labs  Lab 03/20/2021 1056 03/25/2021 1103  WBC 8.4  --   LATICACIDVEN  --  8.2*    Liver Function Tests: Recent Labs  Lab 04/09/2021 1056  AST 258*  ALT 150*  ALKPHOS 135*  BILITOT 0.8  PROT 5.9*  ALBUMIN 3.0*   No results for input(s): LIPASE, AMYLASE in the last 168 hours. No results for input(s): AMMONIA in the last 168 hours.  ABG    Component Value Date/Time   PHART 7.221  (L) 03/22/2021 1208   PCO2ART 46.3 03/22/2021 1208   PO2ART 406 (H) 03/11/2021 1208   HCO3 19.0 (L) 04/01/2021 1208   TCO2 20 (L) 03/30/2021 1208   ACIDBASEDEF 9.0 (H) 04/03/2021 1208   O2SAT 100.0 03/13/2021 1208     Coagulation Profile: No results for input(s): INR, PROTIME in the last 168 hours.  Cardiac Enzymes: No results for input(s): CKTOTAL, CKMB, CKMBINDEX, TROPONINI in the last 168 hours.  HbA1C: No results found for: HGBA1C  CBG: Recent Labs  Lab 03/27/2021 1056 03/29/2021 1338  GLUCAP 308* 249*    Review of Systems:   Review of Systems  Constitutional: Positive for weight loss.  HENT: Negative.   Eyes: Positive for double vision.  Respiratory: Negative for shortness of breath.   Cardiovascular: Negative.   Gastrointestinal: Negative.   Genitourinary: Negative.  Musculoskeletal: Negative.   Skin: Negative.   Neurological: Positive for speech change and weakness.  Endo/Heme/Allergies: Negative.      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   Surgical History:   Past Surgical History:  Procedure Laterality Date  . ABLATION  09/06/2015  . CARDIAC ELECTROPHYSIOLOGY MAPPING AND ABLATION  06/2015  . CARDIOVERSION  04/02/2012   Procedure: CARDIOVERSION;  Surgeon: Deboraha Sprang, MD;  Location: [place];  Service: Cardiovascular;  Laterality: N/A;  . CARDIOVERSION N/A 09/12/2013   Procedure: CARDIOVERSION;  Surgeon: Deboraha Sprang, MD;  Location: [place];  Service: Cardiovascular;  Laterality: N/A;  . CARDIOVERSION N/A 01/26/2014   Procedure: CARDIOVERSION;  Surgeon: Deboraha Sprang, MD;  Location[facility] ENDOSCOPY;  Service: Cardiovascular;  Laterality: N/A;  .  CARDIOVERSION N/A 03/30/2015   Procedure: CARDIOVERSION;  Surgeon: Thompson Grayer, MD;  Location: [facility] ENDOSCOPY;  Service: Cardiovascular;  Laterality: N/A;  . CATARACT EXTRACTION W/ INTRAOCULAR LENS IMPLANT Left 03/2011  . CATARACT EXTRACTION W/ INTRAOCULAR LENS IMPLANT Right 2013  . IR THORACENTESIS ASP PLEURAL SPACE W/IMG GUIDE  03/06/2021  . RADIOFREQUENCY ABLATION       Social History:   reports that she has never smoked. She has never used smokeless tobacco. She reports that she does not drink alcohol and does not use drugs.   Family History:  Her family history includes Osteoporosis in her mother; Other in an other family member. There is no history of Neuromuscular disorder.   Allergies Allergies  Allergen Reactions  . Fosamax [Alendronate]     Other reaction(s): Muscle and joint pain     Home Medications  Prior to Admission medications   Medication Sig Start Date End Date Taking? Authorizing Provider  b complex vitamins capsule Take 1 capsule by mouth daily.    [provider]  bisacodyl (DULCOLAX) 10 MG suppository Place 10 mg rectally as needed for moderate constipation.    [provider]  cholecalciferol (VITAMIN D) 1000 UNITS tablet Take 1,000 Units by mouth daily with lunch.    [provider]  Cholecalciferol (VITAMIN D) 50 MCG (2000 UT) tablet Take 2,000 Units by mouth every morning.    [provider]  diltiazem (CARDIZEM CD) 240 MG 24 hr capsule Take 1 capsule (240 mg total) by mouth at bedtime. 03/12/21 04/11/21  Darliss Cheney, MD  diltiazem (CARDIZEM) 30 MG tablet Take 30 mg by mouth 2 (two) times daily. Morning and lunch 10/22/16   [provider]  dorzolamide (TRUSOPT) 2 % ophthalmic solution Place 1 drop into both eyes 2 (two) times daily.  05/09/15   [provider]  entecavir (BARACLUDE) 1 MG tablet Take 1 mg by mouth in the morning.    [provider]  KLOR-CON M20 20 MEQ tablet TAKE 1 TABLET (20 MEQ TOTAL)  BY MOUTH DAILY. Patient taking differently: Take 20 mEq by mouth daily. 02/12/16   Deboraha Sprang, MD  Magnesium Hydroxide (MILK OF MAGNESIA PO) Take by mouth.    [provider]  magnesium oxide (MAG-OX) 400 MG tablet Take 400 mg by mouth at bedtime. 06/18/15   [provider]  Multiple Vitamins-Minerals (ICAPS AREDS 2 PO) Take 1 tablet by mouth every morning.    [provider]  Omega-3 Fatty Acids (OMEGA 3 PO) Take 1 capsule by mouth 2 (two) times daily.     [provider]  Sodium Phosphates (RA SALINE ENEMA RE) Place rectally.    [provider]  UNABLE TO FIND 120 mLs 2 (two) times daily. Med Pass    [provider]  UNABLE TO FIND at bedtime. Magic cup    [provider]    CRITICAL CARE Performed by: Kipp Brood   Total critical care time: 40 minutes  Critical care time was exclusive of separately billable procedures and treating other patients.  Critical care was necessary to treat or prevent imminent or life-threatening deterioration.  Critical care was time spent personally by me on the following activities: development of treatment plan with patient and/or surrogate as well as nursing, discussions with consultants, evaluation of patient's response to treatment, examination of patient, obtaining history from patient or surrogate, ordering and performing treatments and interventions, ordering and review of laboratory studies, ordering and review of radiographic studies, pulse oximetry, re-evaluation of patient's condition and participation in multidisciplinary rounds.  Kipp Brood, MD [facility] ICU Physician [place]  Pager: [phone] Mobile: [phone] After hours: [phone].

## 2021-03-17 NOTE — Consult Note (Signed)
Consultation Note Date: 03/10/2021   Patient Name: Connie Chase  DOB: Feb 05, 1946  MRN: [id number]  Age / Sex: 75 y.o., female  PCP: Jonathon Jordan, MD Referring Physician: Kipp Brood, MD  Reason for Consultation: Establishing goals of care and Psychosocial/spiritual support  HPI/Patient Profile: 75 y.o. female   admitted on 03/16/2021  having suffered a cardiac arrest secondary to food aspiration while eating today.    Immediate bystander CPR/ reside in a SNF.   ROSC after 20 minutes of resuscitation in the field.  Intubated in the ED for airway protection minimal extension to pain prior to sedation administration.  Food particles visible in posterior valleculae.  She was recently transitioned to SNF for rehab  Patient has an ill-defined neurological process for which she was in rehabilitation and apparently improving.  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    According to her husband, she had on occasion to choke on her food particular if she was eating fast.  Her speech had also become somewhat  dysarthric.    Husband tells me his wife was progressing well at the rehab.  Family face treatment option decisions, advanced directive decisions and anticipatory care needs.  Clinical Assessment and Goals of Care:   This NP Wadie Lessen reviewed medical records, received report from team, assessed the patient and then meet at the patient's bedside  to discuss diagnosis, prognosis, GOC, EOL wishes disposition and options.   Concept of Palliative Care was introduced as specialized medical care for people and their families living with serious illness.  If focuses on providing relief from the symptoms and stress of a serious illness.  The goal is to  improve quality of life for both the patient and the family.   Values and goals of care important to patient and family were attempted to be elicited.  Created space and opportunity for husband  to explore thoughts and feelings regarding current medical situation.  This is very upsetting time for him.  He witnessed the event at the skilled nursing facility.  He tells me this is "first time he is ever seen her unconscious".  They have 2 daughters who will be flying in to [facility] together as a family.   Patient and her husband are originally from [place].  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  Plan for now is watchful waiting,   Husband is hopeful for improvement.  He is open to all offered and available medical interventions to prolong life at this time.  Decisions will be made depending on medical outcomes over the next 24 to 48 hours.  Questions and concerns addressed.  Patient  encouraged to call with questions or concerns.     PMT will continue to support holistically.     NEXT OF KIN/husband    SUMMARY OF RECOMMENDATIONS    Code Status/Advance Care Planning:  Full code   Palliative Prophylaxis:   Delirium Protocol, Frequent Pain Assessment and Oral Care  Additional Recommendations (Limitations, Scope, Preferences):  Full Scope Treatment  Psycho-social/Spiritual:   Desire for further Chaplaincy support:no- declined spiritual care support today.  Husband tells me their spirituality is based in Budd[employer] approach.    Prognosis:   Unable to determine  Discharge Planning: To Be Determined  Primary Diagnoses: Present on Admission: . Aspiration pneumonitis ([place])   I have reviewed the medical record, interviewed the patient and family, and examined the patient. The following aspects are pertinent.  Past Medical History:  Diagnosis Date  . Allergic dermatitis due to  ragweed   . Chronic viral hepatitis B without delta-agent ([place])   . Cirrhosis of liver ([place])   . Fibroid 2004, 2016  . Hemorrhage anterior chamber eye    On pradaxa  . Hepatitis B infection    Chronic Hep B carrier, on suppressive Tx  . Osteoarthritis        . Osteoporosis   . Persistent atrial fibrillation (HCC)    a. s/p DCCV b. intolerant of Flecainide  . Prediabetes    Social History   Socioeconomic History  . Marital status: Married    Spouse name: Not on file  . Number of children: 2  . Years of education: Not on file  . Highest education level: Bachelor's degree (e.g., BA, AB, BS)  Occupational History  . Not on file  Tobacco Use  . Smoking status: Never Smoker  . Smokeless tobacco: Never Used  Vaping Use  . Vaping Use: Never used  Substance and Sexual Activity  . Alcohol use: No    Alcohol/week: 0.0 standard drinks  . Drug use: No  . Sexual activity: Not Currently    Partners: Male    Birth control/protection: Other-see comments    Comment: husband had vasectomy   Other Topics Concern  . Not on file  Social History Narrative   Lives at home with her husband   Right handed   Regular exercise on a stationary bike   Caffeine: none   Social Determinants of Health   Financial Resource Strain: Not on file  Food Insecurity: Not on file  Transportation Needs: Not on file  Physical Activity: Not on file  Stress: Not on file  Social Connections: Not on file   Family History  Problem Relation Age of Onset  . Osteoporosis Mother   . Other Other        Neg cardiac family Hx  . Neuromuscular disorder Neg Hx    Scheduled Meds: . chlorhexidine gluconate (MEDLINE KIT)  15 mL Mouth Rinse BID  . docusate  100 mg Per Tube BID  . enoxaparin (LOVENOX) injection  30 mg Subcutaneous Q24H  . feeding supplement (PROSource TF)  45 mL Per Tube BID  . feeding supplement (VITAL HIGH PROTEIN)  1,000 mL Per Tube Q24H  . fentaNYL (SUBLIMAZE) injection  25 mcg Intravenous  Once  . insulin aspart  2-6 Units Subcutaneous Q4H  . mouth rinse  15 mL Mouth Rinse 10 times per day  . pantoprazole (PROTONIX) IV  40 mg Intravenous QHS  . polyethylene glycol  17 g Per Tube Daily   Continuous Infusions: . sodium chloride Stopped (04/03/2021 1156)  . ampicillin-sulbactam (UNASYN) IV    . fentaNYL infusion INTRAVENOUS    . lactated ringers 125 mL/hr at 03/10/2021 1400   Followed by  . [START ON 03/18/2021] lactated ringers    . levETIRAcetam    . norepinephrine (LEVOPHED) Adult infusion 2 mcg/min (03/23/2021 1400)  . propofol (DIPRIVAN) infusion 20 mcg/kg/min (03/13/2021 1400)   PRN Meds:.docusate, fentaNYL, ondansetron (ZOFRAN) IV, polyethylene glycol Medications Prior to Admission:  Prior to Admission medications   Medication Sig Start Date End Date Taking? Authorizing Provider  b complex vitamins capsule Take 1 capsule by mouth daily.    [provider]  bisacodyl (DULCOLAX) 10 MG suppository Place 10 mg rectally as needed for moderate constipation.    [provider]  cholecalciferol (VITAMIN D) 1000 UNITS tablet Take 1,000 Units by mouth daily with lunch.    [provider]  Cholecalciferol (VITAMIN D) 50 MCG (2000 UT) tablet Take 2,000 Units by mouth every morning.    [provider]  diltiazem (CARDIZEM CD) 240 MG 24 hr capsule Take 1 capsule (240 mg total) by mouth at bedtime. 03/12/21 04/11/21  Darliss Cheney, MD  diltiazem (CARDIZEM) 30 MG tablet Take 30 mg by mouth 2 (two) times daily. Morning and lunch 10/22/16   [provider]  dorzolamide (TRUSOPT) 2 % ophthalmic solution Place 1 drop into both eyes 2 (two) times daily.  05/09/15   [provider]  entecavir (BARACLUDE) 1 MG tablet Take 1 mg by mouth in the morning.    [provider]  KLOR-CON M20 20 MEQ tablet TAKE 1 TABLET (20 MEQ TOTAL) BY MOUTH DAILY. Patient taking differently: Take 20 mEq by mouth daily. 02/12/16   Deboraha Sprang, MD  Magnesium  Hydroxide (MILK OF MAGNESIA PO) Take by mouth.    [provider]  magnesium oxide (MAG-OX) 400 MG tablet Take 400 mg by mouth at bedtime. 06/18/15   [provider]  Multiple Vitamins-Minerals (ICAPS AREDS 2 PO) Take 1 tablet by mouth every morning.    [provider]  Omega-3 Fatty Acids (OMEGA 3 PO) Take 1 capsule by mouth 2 (two) times daily.     [provider]  Sodium Phosphates (RA SALINE ENEMA RE) Place rectally.    [provider]  UNABLE TO FIND 120 mLs 2 (two) times daily. Med Pass    [provider]  UNABLE TO FIND at bedtime. Magic cup    [provider]   Allergies  Allergen Reactions  . Fosamax [Alendronate]     Other reaction(s): Muscle and joint pain   Review of Systems  Unable to perform ROS: Intubated    Physical Exam Constitutional:      Appearance: She is normal weight. She is ill-appearing.     Interventions: She is intubated.  Cardiovascular:     Rate and Rhythm: Normal rate.  Pulmonary:     Effort: She is intubated.  Skin:    General: Skin is warm and dry.     Vital Signs: BP 124/82   Pulse 63   Temp (!) 97.5 F (36.4 C) (Rectal)   Resp 20   Ht '5\' 3"'  (1.6 m)   LMP 11/10/2006 (Approximate)   SpO2 93%   BMI 14.33 kg/m          SpO2: SpO2: 93 % O2 Device:SpO2: 93 % O2 Flow Rate: .   IO: Intake/output summary:   Intake/Output Summary (Last 24 hours) at 03/18/2021 1522 Last data filed at 04/02/2021 1400 Gross per 24 hour  Intake 64.84 ml  Output --  Net 64.84 ml    LBM:   Baseline Weight:   Most recent weight:       Palliative Assessment/Data:   PMT will continue to support holistically   Time In: 1430 Time Out: 1530 Time Total: 60 minutes Greater than 50%  of this time was spent counseling and coordinating care related to the above assessment and plan.  Signed by: Wadie Lessen, NP   Please contact Palliative Medicine Team phone at [phone] for questions and concerns.   For individual provider: See Shea Evans

## 2021-03-17 NOTE — ED Notes (Signed)
Critical LA 8.2. Results given to Primary RN and MD.

## 2021-03-17 NOTE — ED Triage Notes (Signed)
Pt BIB EMS from [employer] [facility]. EMS reports husband was with when she got chocked on food. EMS reports down time of 7 mins.On EMS arrival pt was in PEA. CPR done for 5-6 mins. EMS had patient on epi drip.POST CPR before arrival was 15 mins.EMS states that they pulled food out of her airway

## 2021-03-18 ENCOUNTER — Inpatient Hospital Stay (HOSPITAL_COMMUNITY): Payer: Medicare Other

## 2021-03-18 DIAGNOSIS — J690 Pneumonitis due to inhalation of food and vomit: Secondary | ICD-10-CM

## 2021-03-18 DIAGNOSIS — R569 Unspecified convulsions: Secondary | ICD-10-CM

## 2021-03-18 LAB — GLUCOSE, CAPILLARY
Glucose-Capillary: 215 mg/dL — ABNORMAL HIGH (ref 70–99)
Glucose-Capillary: 155 mg/dL — ABNORMAL HIGH (ref 70–99)
Glucose-Capillary: 132 mg/dL — ABNORMAL HIGH (ref 70–99)
Glucose-Capillary: 106 mg/dL — ABNORMAL HIGH (ref 70–99)
Glucose-Capillary: 138 mg/dL — ABNORMAL HIGH (ref 70–99)
Glucose-Capillary: 172 mg/dL — ABNORMAL HIGH (ref 70–99)

## 2021-03-18 LAB — CBC
HCT: 45.7 % (ref 36.0–46.0)
Hemoglobin: 14.5 g/dL (ref 12.0–15.0)
MCH: 33.9 pg (ref 26.0–34.0)
MCHC: 31.7 g/dL (ref 30.0–36.0)
MCV: 106.8 fL — ABNORMAL HIGH (ref 80.0–100.0)
Platelets: 172 10*3/uL (ref 150–400)
RBC: 4.28 MIL/uL (ref 3.87–5.11)
RDW: 13.4 % (ref 11.5–15.5)
WBC: 13 10*3/uL — ABNORMAL HIGH (ref 4.0–10.5)
nRBC: 0 % (ref 0.0–0.2)

## 2021-03-18 LAB — MAGNESIUM
Magnesium: 2 mg/dL (ref 1.7–2.4)
Magnesium: 1.9 mg/dL (ref 1.7–2.4)

## 2021-03-18 LAB — BASIC METABOLIC PANEL
Anion gap: 11 (ref 5–15)
BUN: 17 mg/dL (ref 8–23)
CO2: 21 mmol/L — ABNORMAL LOW (ref 22–32)
Calcium: 7.9 mg/dL — ABNORMAL LOW (ref 8.9–10.3)
Chloride: 111 mmol/L (ref 98–111)
Creatinine, Ser: 0.57 mg/dL (ref 0.44–1.00)
GFR, Estimated: >60 mL/min (ref >60)
Glucose, Bld: 169 mg/dL — ABNORMAL HIGH (ref 70–99)
Potassium: 3.5 mmol/L (ref 3.5–5.1)
Sodium: 143 mmol/L (ref 135–145)

## 2021-03-18 LAB — TRIGLYCERIDES: Triglycerides: 48 mg/dL (ref <150)

## 2021-03-18 LAB — LACTIC ACID, PLASMA: Lactic Acid, Venous: 2.5 mmol/L (ref 0.5–1.9)

## 2021-03-18 LAB — PHOSPHORUS
Phosphorus: 3 mg/dL (ref 2.5–4.6)
Phosphorus: 2.9 mg/dL (ref 2.5–4.6)

## 2021-03-18 MED ORDER — POTASSIUM CHLORIDE 20 MEQ PO PACK
40.0000 meq | PACK | Freq: Once | ORAL | Status: AC
  Administered 2021-03-18: 40 meq
  Filled 2021-03-18: qty 2

## 2021-03-18 MED ORDER — VITAL AF 1.2 CAL PO LIQD
1000.0000 mL | ORAL | Status: DC
  Administered 2021-03-18 – 2021-03-19 (×4): 1000 mL
  Filled 2021-03-18 (×2): qty 1000

## 2021-03-18 MED ORDER — LACTATED RINGERS IV SOLN
INTRAVENOUS | Status: DC
  Administered 2021-03-21: 75 mL/h via INTRAVENOUS

## 2021-03-18 MED ORDER — CHLORHEXIDINE GLUCONATE CLOTH 2 % EX PADS
6.0000 | MEDICATED_PAD | Freq: Every day | CUTANEOUS | Status: DC
  Administered 2021-03-18 – 2021-03-21 (×4): 6 via TOPICAL

## 2021-03-18 NOTE — Progress Notes (Signed)
vLTM EEG maintenance complete. No skin breakdown seen at [facility] FP& F7 continue to monitor

## 2021-03-18 NOTE — Progress Notes (Addendum)
NAME:  Connie Chase, MRN:  [id number], DOB:  03-21-1946, LOS: 1 ADMISSION DATE:  03/23/2021, CONSULTATION DATE:   03/16/2021 REFERRING MD:  Johnney Killian, ED [facility], CHIEF COMPLAINT:  Post cardiac arrest.    History of Present Illness:  75 year old woman who suffered a cardiac arrest secondary to suffocation due to food aspiration while eating today.  Immediate bystander CPR.  ROSC after 20 minutes of resuscitation in the field.  Intubated in the ED for airway protection minimal extension to pain prior to sedation administration.  Food particles visible in posterior valleculae.  Patient has an ill-defined neurological process for which she was in rehabilitation and apparently improving.  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  According to her husband, she had occasion to choke on her food particular if she was eating fast.  Her speech had also become somewhat  dysarthric Pertinent  Medical History   Past Medical History:  Diagnosis Date  . 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 Chronic viral hepatitis B without delta-agent ([place])   . Cirrhosis of liver ([place])   . Fibroid 2004, 2016  . Hemorrhage anterior chamber eye   . Hepatitis B infection   . Osteoarthritis    . Osteoporosis   . Prediabetes     Significant Hospital Events: Including procedures, antibiotic start and stop dates in addition to other pertinent events   5/8-admitted from [facility] rehabilitation. (After cardiac arrest)  Interim History / Subjective:  Sedated. Unconscious. Unable to obtain Hx.  Overnight, received K phosphate 20 mmol IV for 3.2 and Phos 1.6 post cardiac arrest. Objective   Blood pressure 124/89, pulse 87, temperature (!) 96.62 F (35.9 C), resp. rate 20, height 5\' 3"  (1.6 m), weight 35.9 kg, last menstrual period 11/10/2006, SpO2 100 %.     Vent Mode: PRVC FiO2 (%):  [40 %-100 %] 40 % Set Rate:  [16 bmp-20 bmp] 20 bmp Vt Set:  [420 mL] 420 mL PEEP:  [5 cmH20] 5 cmH20 Plateau Pressure:  [14 cmH20-15 cmH20] 14 cmH20   Intake/Output Summary (Last 24 hours) at 03/18/2021 0719 Last data filed at 03/18/2021 0600 Gross per 24 hour  Intake 3119.06 ml  Output 1375 ml  Net 1744.06 ml   Filed Weights   03/18/21 0500  Weight: 35.9 kg    Examination: General: Intubated, sedated HENT: Pupils are symmetric, in normal size and reactive to light Lungs: Intubated and on ventilator: PRVC, 40%, 5, RR 20, TV 420 ml, Rhonchi/rale at rt middle and lower lobs Cardiovascular: RRR, no murmur Abdomen: Soft, not distended Extremities: Muscle atrophy, occasional rt leg myoclonus movement Neuro: Sedated. Grimace and has some rt arm moves when tried to check gag GU: Foley cath in place with yellow with adequate urine output  Labs/imaging that I havepersonally reviewed  (right click and "Reselect all SmartList Selections" daily)  Metabolic acidosis on arrival. Elevated transaminase. Elevated LA on arrival. Had hypokalemia and hypophos this AM. CMP Latest Ref Rng & Units 03/20/2021 03/13/2021 03/13/2021  Glucose 70 - 99 mg/dL - 319(H) 132(H)  BUN 8 - 23 mg/dL - 20 13  Creatinine 0.44 - 1.00 mg/dL - 0.85 0.53  Sodium 135 - 145 mmol/L 147(H) 144 141  Potassium 3.5 - 5.1 mmol/L 3.2(L) 4.0 3.7  Chloride 98 - 111 mmol/L - 111 103  CO2 22 -  32 mmol/L - 13(L) 30  Calcium 8.9 - 10.3 mg/dL - 8.1(L) 8.8(L)  Total Protein 6.5 - 8.1 g/dL - 5.9(L) -  Total Bilirubin 0.3 - 1.2 mg/dL - 0.8 -  Alkaline Phos 38 - 126 U/L - 135(H) -  AST 15 - 41 U/L - 258(H) -  ALT 0 - 44 U/L - 150(H) -   CBC Latest Ref Rng & Units 03/27/2021 04/02/2021 03/08/2021  WBC 4.0 - 10.5 K/uL - 8.4 4.9  Hemoglobin 12.0 - 15.0 g/dL 13.3 14.4 13.4  Hematocrit 36.0 - 46.0 % 39.0 48.2(H) 39.3  Platelets 150 - 400 K/uL - 255 298   Resolved Hospital Problem list   NA Assessment & Plan:   Post cardiac arrest: 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 CXR showed aspiration PNA at left side.  On norepinephrine  -Titrate norepinephrine to keep MAP greater than 65, fluid resuscitation. temperature goal: normothermia postcardiac arrest care.   -Sedation goal: 48 hours of sedation (started 5/8), followed by 48 hours of drug washout prior to neuro prognostication at 96 hours. -Titrate propofol to control myoclonus.  Continue Keppra. -Continue full ventilatory support.  At risk for developing ARDS. -Continue Unasyn for antibiotic coverage.  EEG 5/8 showed: myoclonic seizures every few minutes as we all profound diffuse encephalopathy. 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 Still has occasional myoclonus movement of rt leg.  -Continue Keppra  -Continue Propofol  -On continuous EEG monitoring  GOC discussion: Prognosis from cardiac arrest is guarded particular in the context of ill-defined but progressive neurological disorder and also given she developed seizure post arrest. Husband was informed by PCCM team. Palliative care consulted.  5/8: Palliative discussed [place] with husband and per note from yesterday, patient is full code and full scope of treatment. Patient has 2 daughters who are fly to [place] per note. 5/9: Ongoing GOC discussion.  Best practice (right click and "Reselect all SmartList Selections" daily)  Diet:  Tube Feed  Pain/Anxiety/Delirium protocol (if indicated): Yes (RASS goal -1) VAP protocol (if indicated): Yes DVT prophylaxis: Subcutaneous Heparin and LMWH GI prophylaxis: PPI Glucose control:  SSI No Central venous access:  N/A Arterial line:  N/A Foley:  Yes, and it is still needed Mobility:  bed rest  PT consulted: N/A Last date of multidisciplinary goals of care discussion [5/8] Code Status:  full code. Pending another  discussion w family today Disposition: Remains in ICU  Critical care time:    Dewayne Hatch, MD IM-[id number] 03/18/2021, 11:16 AM Pager: [phone]  Attending note: I have seen and examined the patient. History, labs and imaging reviewed.  75 Y/O with falls, possible Parkinson's, cerebellar ataxia Admitted with cardiac arrest after choking episode, 20 mins to ROSC, on normothermia protocol, noepi, vent, deeply sedated.   Blood pressure 111/78, pulse 80, temperature (!) 96.62 F (35.9 C), resp. rate 20, height 5\' 3"  (1.6 m), weight 35.9 kg, last menstrual period 11/10/2006, SpO2 100 %. Gen:      No acute distress HEENT:  EOMI, sclera anicteric Neck:     No masses; no thyromegaly, ET tube Lungs:    Clear to auscultation bilaterally; normal respiratory effort CV:         Regular rate and rhythm; no murmurs Abd:      + bowel sounds; soft, non-tender; no palpable masses, no distension Ext:    No edema; adequate peripheral perfusion Skin:  Warm and dry; no rash Neuro: Unresponsive  Labs/Imaging personally reviewed, significant for EEG shows myoclonic seizures Chest x-ray shows worsening bilateral basal opacities, ET tube in stable position   Assessment/plan: Post cardiac arrest Myoclonic seizures portends a poor prognosis On normothermia protocol Follow neuro prognosis Continue levo, wean as tolerated Keppra  Acute respiratory Aspiration pneumonia On unasyn.  Goals of care Palliative care has been consulted Ongoing discussions with family Prognosis is poor for meaningful recovery as she already has signs of significant anoxic injury  The patient is critically ill with multiple organ systems failure and requires high complexity decision making for assessment and support, frequent evaluation and titration of therapies, application of advanced monitoring technologies and extensive interpretation of multiple databases.  Critical care time - 35 mins. This represents my time  independent of the NPs time taking care of the pt.  Marshell Garfinkel MD [facility] 03/18/2021, 8:26 AM

## 2021-03-18 NOTE — Procedures (Signed)
Patient Name: Connie Chase  MRN: [id number]  Epilepsy Attending: Lora Havens  Referring Physician/Provider: Dr Kipp Brood Duration: 03/25/2021 1651 to 03/18/2021 1651  Patient history: 75yo F s/p cardiac arrest. EEG to evaluate for seizure  Level of alertness:  comatose  AEDs during EEG study: Propofol, LEV  Technical aspects: This EEG study was done with scalp electrodes positioned according to the 10-20 International system of electrode placement. Electrical activity was acquired at a sampling rate of 500Hz  and reviewed with a high frequency filter of 70Hz  and a low frequency filter of 1Hz . EEG data were recorded continuously and digitally stored.   Description: At the beginning of the study, episodes of spontaneous eye opening were noted every few minutes. Concomitant eeg showed burst of generalized polyspikes lasting 3-5 seconds consistent with myoclonic seizure. EEG also showed  burst suppression pattern with highly epileptiform bursts lasting 3 to 5 seconds alternating with EEG suppression lasting 8 to 12 seconds. EEG was not reactive to tactile stimulation.    Event button was placed on 03/19/2021 at 2340. Per RN patient had facial twitching which was difficult to visualize on camera. Concomitant EEG showed generalized polyspike most likely suggestive of myoclonic seizure.  ABNORMALITY - Myoclonic seizure, generalized - Burst suppression with highly epileptiform discharges, generalized  IMPRESSION: This study  initially showed myoclonic seizures every few minutes.  Gradually, EEG showed evidence of generalized epileptogenicity as we all profound diffuse encephalopathy. 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  [name] [name]

## 2021-03-18 NOTE — Progress Notes (Addendum)
Initial Nutrition Assessment  DOCUMENTATION CODES:   Severe malnutrition in context of chronic illness,Underweight  INTERVENTION:   Initiate tube feeding via OG tube: Vital AF 1.2 at 40 ml/h (960 ml per day)  Provides 1152 kcal (1326 kcal total with propofol), 72 gm protein, 779 ml free water daily.  MVI daily via tube.   Monitor magnesium, potassium, and phosphorus levels, MD to replete as needed, as pt is at risk for refeeding syndrome given severe malnutrition with low K and Phos on 5/8.   NUTRITION DIAGNOSIS:   Severe Malnutrition related to chronic illness (cirrhosis) as evidenced by severe muscle depletion,severe fat depletion.  GOAL:   Patient will meet greater than or equal to 90% of their needs  MONITOR:   Vent status,Labs,TF tolerance,Skin  REASON FOR ASSESSMENT:   Ventilator,Consult Enteral/tube feeding initiation and management  ASSESSMENT:   75 yo female admitted S/P cardiac arrest r/t food aspiration at rehab SNF. PMH includes cerebellar ataxia syndrome, cirrhosis, osteoarthritis, osteoporosis, A fib, hepatitis B.   Palliative Care team is following with plans for watchful waiting in hopes of improvement.   Received MD Consult for TF initiation and management. Currently receiving Vital High Protein at 40 ml/h via OG tube with Prosource TF 45 ml BID to provide 1040 kcal, 106 gm protein, 803 ml free water daily.   Discussed patient in ICU rounds and with RN today. Patient is anoxic. Goals of care to be discussed with family.  Patient is currently intubated on ventilator support MV: 8.7 L/min Temp (24hrs), Avg:97.5 F (36.4 C), Min:93.38 F (34.1 C), Max:99.68 F (37.6 C)  Propofol: 6.6 ml/hr providing 174 kcal from lipid.   Labs reviewed. Phos 1.6, K 3.2, Na 147 CBG: 215-155  Medications reviewed and include Novolog, Protonix, IV Unasyn, Keppra, Levophed, propofol, IV potassium phosphate.   NUTRITION - FOCUSED PHYSICAL EXAM:  Flowsheet Row  Most Recent Value  Orbital Region Severe depletion  Upper Arm Region Severe depletion  Thoracic and Lumbar Region Severe depletion  Buccal Region Unable to assess  Temple Region Unable to assess  Clavicle Bone Region Severe depletion  Clavicle and Acromion Bone Region Severe depletion  Scapular Bone Region Unable to assess  Dorsal Hand Severe depletion  Patellar Region Severe depletion  Anterior Thigh Region Severe depletion  Posterior Calf Region Severe depletion  Edema (RD Assessment) None  Hair Unable to assess  Eyes Unable to assess  Mouth Unable to assess  Skin Reviewed  Nails Reviewed       Diet Order:   Diet Order    None      EDUCATION NEEDS:   Not appropriate for education at this time  Skin:  Skin Assessment: Skin Integrity Issues: Skin Integrity Issues:: DTI DTI: sacrum  Last BM:  5/9 rectal tube  Height:   Ht Readings from Last 1 Encounters:  04/05/2021 5\' 3"  (1.6 m)    Weight:   Wt Readings from Last 1 Encounters:  03/18/21 35.9 kg    Ideal Body Weight:  52.3 kg  BMI:  Body mass index is 14.02 kg/m.  Estimated Nutritional Needs:   Kcal:  4270  Protein:  60-70 gm  Fluid:  >/= 1.4 L    Lucas Mallow, RD, LDN, CNSC Please refer to Amion for contact information.

## 2021-03-18 NOTE — Consult Note (Signed)
Neurology Consultation Reason for Consult: Cardiac arrest and myoclonic seizures Referring Physician: Dr. Marshell Garfinkel  CC: Cardiac arrest myoclonic seizures  History is obtained from: Chart review as patient is altered and unable to provide history  HPI: Connie Chase is a 75 y.o. female who presented on 5/8/122 after cardiac arrest.  Per critical care note, patient suffered a cardiac arrest secondary to suffocation due to food aspiration.  Bystander CPR was performed, ROSC was achieved after about 20 minutes.  On arrival, EEG was performed which showed frequent myoclonic seizures.  Neurology was consulted for further management  ROS: Unable to obtain due to altered mental status.   Past Medical History:  Diagnosis Date  . 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 Chronic viral hepatitis B without delta-agent ([place])   . Cirrhosis of liver ([place])   . Fibroid 2004, 2016  . Hemorrhage anterior chamber eye    On pradaxa  . Hepatitis B infection    Chronic Hep B carrier, on suppressive Tx  . Osteoarthritis        . Osteoporosis   . Persistent atrial fibrillation ([facility])    a. s/p DCCV b. intolerant of Flecainide  . Prediabetes     Family History  Problem Relation Age of Onset  . Osteoporosis Mother   . Other Other        Neg cardiac family Hx  . Neuromuscular disorder Neg Hx     Social History:  reports that she has never smoked. She has never used smokeless tobacco. She reports that she does not drink alcohol and does not use drugs.   Medications Prior to Admission  Medication Sig Dispense Refill Last Dose  . b complex vitamins capsule Take 1 capsule by mouth daily.     . bisacodyl (DULCOLAX) 10 MG suppository Place 10 mg rectally as needed for moderate constipation.     . cholecalciferol (VITAMIN D) 1000 UNITS tablet Take 1,000 Units by mouth daily with lunch.     . Cholecalciferol (VITAMIN D) 50 MCG (2000 UT) tablet Take 2,000 Units by mouth every morning.     . diltiazem  (CARDIZEM CD) 240 MG 24 hr capsule Take 1 capsule (240 mg total) by mouth at bedtime. 30 capsule 0   . diltiazem (CARDIZEM) 30 MG tablet Take 30 mg by mouth 2 (two) times daily. Morning and lunch     . dorzolamide (TRUSOPT) 2 % ophthalmic solution Place 1 drop into both eyes 2 (two) times daily.      Marland Kitchen entecavir (BARACLUDE) 1 MG tablet Take 1 mg by mouth in the morning.     Marland Kitchen KLOR-CON M20 20 MEQ tablet TAKE 1 TABLET (20 MEQ TOTAL) BY MOUTH DAILY. (Patient taking differently: Take 20 mEq by mouth daily.) 60 tablet 0   . Magnesium Hydroxide (MILK OF MAGNESIA PO) Take by mouth.     . magnesium oxide (MAG-OX) 400 MG tablet Take 400 mg by mouth at bedtime.     . Multiple Vitamins-Minerals (ICAPS AREDS 2 PO) Take 1 tablet by mouth every morning.     . Omega-3 Fatty Acids (OMEGA 3 PO) Take 1 capsule by mouth 2 (two) times daily.      . Sodium Phosphates (RA SALINE ENEMA RE) Place rectally.     Marland Kitchen UNABLE TO FIND 120 mLs 2 (two) times daily. Med Pass     . UNABLE TO FIND at bedtime. Magic cup         Exam: Current vital signs: BP Marland Kitchen)  92/52   Pulse 100   Temp (!) 96.26 F (35.7 C)   Resp 20   Ht 5\' 3"  (1.6 m)   Wt 35.9 kg   LMP 11/10/2006 (Approximate)   SpO2 100%   BMI 14.02 kg/m  Vital signs in last 24 hours: Temp:  [94.1 F (34.5 C)-99.68 F (37.6 C)] 96.26 F (35.7 C) (05/09 1345) Pulse Rate:  [80-110] 100 (05/09 1345) Resp:  [12-26] 20 (05/09 1345) BP: (74-128)/(38-113) 92/52 (05/09 1345) SpO2:  [89 %-100 %] 100 % (05/09 1345) FiO2 (%):  [40 %] 40 % (05/09 1200) Weight:  [35.9 kg] 35.9 kg (05/09 0500)   Physical Exam  Constitutional: Laying in bed, not in apparent distress Psych: Unable to assess due to altered mental status Head: Normocephalic, EEG leads on place Cardiovascular: Normal rate and regular rhythm.  Respiratory: Intubated, coarse breath sounds bilaterally  GI: Soft.  No distension Skin: Warm Neuro: Comatose, does not open eyes noxious stimuli, PERRLA, corneal  reflex absent, gag reflex absent, does not withdraw to noxious stimuli in all extremities  I have reviewed labs in epic and the results pertinent to this consultation are: CBC:  Recent Labs  Lab 03/31/2021 1056 03/13/2021 1208  WBC 8.4  --   NEUTROABS 6.7  --   HGB 14.4 13.3  HCT 48.2* 39.0  MCV 114.2*  --   PLT 255  --     Basic Metabolic Panel:  Lab Results  Component Value Date   NA 143 03/18/2021   K 3.5 03/18/2021   CO2 21 (L) 03/18/2021   GLUCOSE 169 (H) 03/18/2021   BUN 17 03/18/2021   CREATININE 0.57 03/18/2021   CALCIUM 7.9 (L) 03/18/2021   GFRNONAA >60 03/18/2021   GFRAA 109 10/15/2020   Lipid Panel: No results found for: LDLCALC HgbA1c: No results found for: HGBA1C Urine Drug Screen: No results found for: LABOPIA, COCAINSCRNUR, LABBENZ, AMPHETMU, THCU, LABBARB  Alcohol Level No results found for: ETH   I have reviewed the images obtained:  CT head without contrast 03/24/2021: No acute finding by CT. No evidence of acute hypoxic ischemic brain injury or acute focal brain infarction. Old right frontal encephalomalacia which could be due to old head trauma or ischemic infarction.  ASSESSMENT/PLAN: 75 year old female status post cardiac arrest.  Cardiac arrest Suspected anoxic/hypoxic brain injury Myoclonic seizures -LTM EEG continues to show burst suppression with highly epileptiform discharges  Recommendations: -Continue propofol@50mcg /hr, Keppra 1000 mg twice daily -Plan for MRI brain tomorrow to look for anoxic/hypoxic brain injury -Called and explained to patient's husband that given patient's history, prolonged downtime, neurologic exam EEG findings, I suspect patient has sustained severe anoxic brain injury with minimal to no chances of meaningful neurologic recovery.  Husband states his wife had mentioned that she would not want to live on long-term life support.  He will wait for the MRI results and then speak with his children regarding further goals of  care -Continue precautions -As needed IV Ativan for clinical seizure-like activity   Thank you for allowing [place] to participate in the care of this patient. If you have any further questions, please contact  me or neurohospitalist.    CRITICAL CARE Performed by: Lora Havens  Total critical care time: 35 minutes  Critical care time was exclusive of separately billable procedures and treating other patients.  Critical care was necessary to treat or prevent imminent or life-threatening deterioration.  Critical care was time spent personally by me on the following activities: development of  treatment plan with patient and/or surrogate as well as nursing, discussions with consultants, evaluation of patient's response to treatment, examination of patient, obtaining history from patient or surrogate, ordering and performing treatments and interventions, ordering and review of laboratory studies, ordering and review of radiographic studies, pulse oximetry and re-evaluation of patient's condition.  Zeb Comfort Epilepsy [facility] neuro[facility]ist

## 2021-03-18 NOTE — Progress Notes (Signed)
Pharmacy Electrolyte Replacement  Recent Labs:  Recent Labs    03/18/21 0648  K 3.5  MG 2.0  PHOS 3.0  CREATININE 0.57    Low Critical Values (K </= 2.5, Phos </= 1, Mg </= 1) Present: None  MD Contacted: n/a  Plan: KCl 61mEq per tube x1 dose.  Sloan Leiter, PharmD, BCPS, BCCCP Clinical Pharmacist Please refer to [facility] for [place] numbers 03/18/2021, 12:48 PM

## 2021-03-18 NOTE — Progress Notes (Signed)
ADDENDUM:  AM labs result including BMP, CBC still not visible on epic. I talked to the lab and they will look into it. Dewayne Hatch, MD IM-[id number] 03/18/2021, 11:27 AM Pager: [phone]

## 2021-03-18 NOTE — Progress Notes (Addendum)
Patient's husband and one of the daughters are in room. Discussed patient's condition with them. They state that they are aware of likelihood of brain injury after cardiac arrest.  Family are very reasonable and ask appropriate questions. All their questions and concerns were addressed. They would like to continue full code and full scope of care today and get updated tomorrow. Plan is reassessment of neurologic status/exam tomorrow (48 h after the cardiac arrest event) with decreasing the sedation   ADDENDUM of last note: Patient with Hx of persistent atrial fibrilation s/p DCCV, intolerant of Flecainide, she used to be on [facility] but it was stopped last hospitalization (4/24-03/12/2021) and after rt. apical hemothorax 2/2 fall. (She has Hx of recurrent fall). She is on Diltiazem at home for rate control. Per family, patient used to be on rhythm control as well but per chart review she stopped that given no improvement. Will hold off of AC today. Her HR has been around 90s off of rate control and on Levo. Will continue current management.  4:40 PM: Phlebotomy was not able to collect blood for LA. Will (mildly) increase IVF rate to 75 ml/h tonight until recheck LA tomorrow AM.   Dewayne Hatch, MD IM-[id number] 03/18/2021, 3:05 PM Pager: [phone]

## 2021-03-19 ENCOUNTER — Inpatient Hospital Stay (HOSPITAL_COMMUNITY): Payer: Medicare Other

## 2021-03-19 DIAGNOSIS — J690 Pneumonitis due to inhalation of food and vomit: Secondary | ICD-10-CM | POA: Diagnosis not present

## 2021-03-19 DIAGNOSIS — Z515 Encounter for palliative care: Secondary | ICD-10-CM | POA: Diagnosis not present

## 2021-03-19 DIAGNOSIS — Z66 Do not resuscitate: Secondary | ICD-10-CM

## 2021-03-19 DIAGNOSIS — R569 Unspecified convulsions: Secondary | ICD-10-CM | POA: Diagnosis not present

## 2021-03-19 DIAGNOSIS — G931 Anoxic brain damage, not elsewhere classified: Secondary | ICD-10-CM

## 2021-03-19 LAB — LACTIC ACID, PLASMA
Lactic Acid, Venous: 4.1 mmol/L (ref 0.5–1.9)
Lactic Acid, Venous: 3.9 mmol/L (ref 0.5–1.9)
Lactic Acid, Venous: 3.9 mmol/L (ref 0.5–1.9)

## 2021-03-19 LAB — CBC
HCT: 43.1 % (ref 36.0–46.0)
Hemoglobin: 13.9 g/dL (ref 12.0–15.0)
MCH: 34 pg (ref 26.0–34.0)
MCHC: 32.3 g/dL (ref 30.0–36.0)
MCV: 105.4 fL — ABNORMAL HIGH (ref 80.0–100.0)
Platelets: 198 10*3/uL (ref 150–400)
RBC: 4.09 MIL/uL (ref 3.87–5.11)
RDW: 13.4 % (ref 11.5–15.5)
WBC: 13.9 10*3/uL — ABNORMAL HIGH (ref 4.0–10.5)
nRBC: 0 % (ref 0.0–0.2)

## 2021-03-19 LAB — BASIC METABOLIC PANEL
Anion gap: 9 (ref 5–15)
BUN: 20 mg/dL (ref 8–23)
CO2: 24 mmol/L (ref 22–32)
Calcium: 8.2 mg/dL — ABNORMAL LOW (ref 8.9–10.3)
Chloride: 112 mmol/L — ABNORMAL HIGH (ref 98–111)
Creatinine, Ser: 0.5 mg/dL (ref 0.44–1.00)
GFR, Estimated: >60 mL/min (ref >60)
Glucose, Bld: 152 mg/dL — ABNORMAL HIGH (ref 70–99)
Potassium: 4 mmol/L (ref 3.5–5.1)
Sodium: 145 mmol/L (ref 135–145)

## 2021-03-19 LAB — GLUCOSE, CAPILLARY
Glucose-Capillary: 127 mg/dL — ABNORMAL HIGH (ref 70–99)
Glucose-Capillary: 97 mg/dL (ref 70–99)
Glucose-Capillary: 151 mg/dL — ABNORMAL HIGH (ref 70–99)
Glucose-Capillary: 108 mg/dL — ABNORMAL HIGH (ref 70–99)
Glucose-Capillary: 138 mg/dL — ABNORMAL HIGH (ref 70–99)
Glucose-Capillary: 130 mg/dL — ABNORMAL HIGH (ref 70–99)

## 2021-03-19 MED ORDER — LACTATED RINGERS IV BOLUS
500.0000 mL | Freq: Once | INTRAVENOUS | Status: AC
  Administered 2021-03-19: 500 mL via INTRAVENOUS

## 2021-03-19 MED ORDER — AMIODARONE LOAD VIA INFUSION: 150.0000 mg | Freq: Once | INTRAVENOUS | Status: DC

## 2021-03-19 MED ORDER — AMIODARONE HCL IN DEXTROSE 360-4.14 MG/200ML-% IV SOLN
30.0000 mg/h | INTRAVENOUS | Status: DC
  Administered 2021-03-19 – 2021-03-21 (×4): 30 mg/h via INTRAVENOUS
  Filled 2021-03-19 (×3): qty 200

## 2021-03-19 MED ORDER — AMIODARONE HCL IN DEXTROSE 360-4.14 MG/200ML-% IV SOLN: 30.0000 mg/h | INTRAVENOUS | Status: DC

## 2021-03-19 MED ORDER — SODIUM CHLORIDE 0.9 % IV BOLUS
500.0000 mL | Freq: Once | INTRAVENOUS | Status: AC
  Administered 2021-03-19: 500 mL via INTRAVENOUS

## 2021-03-19 MED ORDER — AMIODARONE IV BOLUS ONLY 150 MG/100ML
150.0000 mg | Freq: Once | INTRAVENOUS | Status: AC
  Administered 2021-03-19: 150 mg via INTRAVENOUS

## 2021-03-19 MED ORDER — LORAZEPAM 2 MG/ML IJ SOLN: 1.0000 mg | INTRAMUSCULAR | Status: DC | PRN

## 2021-03-19 MED ORDER — ACETAMINOPHEN 325 MG PO TABS
650.0000 mg | ORAL_TABLET | ORAL | Status: DC | PRN
  Administered 2021-03-19: 650 mg via ORAL
  Filled 2021-03-19: qty 2

## 2021-03-19 MED ORDER — AMIODARONE HCL IN DEXTROSE 360-4.14 MG/200ML-% IV SOLN: 60.0000 mg/h | INTRAVENOUS | Status: DC

## 2021-03-19 MED ORDER — AMIODARONE HCL IN DEXTROSE 360-4.14 MG/200ML-% IV SOLN
60.0000 mg/h | INTRAVENOUS | Status: AC
  Administered 2021-03-19: 60 mg/h via INTRAVENOUS
  Filled 2021-03-19 (×2): qty 200

## 2021-03-19 MED ORDER — MAGNESIUM SULFATE 2 GM/50ML IV SOLN
2.0000 g | Freq: Once | INTRAVENOUS | Status: AC
  Administered 2021-03-19: 2 g via INTRAVENOUS
  Filled 2021-03-19: qty 50

## 2021-03-19 NOTE — Progress Notes (Addendum)
NAME:  Connie Chase, MRN:  [id number], DOB:  1946-07-04, LOS: 2 ADMISSION DATE:  Mar 31, 2021, CONSULTATION DATE:   03/31/2021 REFERRING MD:  Johnney Killian, ED [facility], CHIEF COMPLAINT:  Post cardiac arrest.    History of Present Illness:  75 year old woman who suffered a cardiac arrest secondary to suffocation due to food aspiration while eating today.  Immediate bystander CPR.  ROSC after 20 minutes of resuscitation in the field.  Intubated in the ED for airway protection minimal extension to pain prior to sedation administration.  Food particles visible in posterior valleculae.  Patient has an ill-defined neurological process for which she was in rehabilitation and apparently improving.  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  According to her husband, she had occasion to choke on her food particular if she was eating fast.  Her speech had also become somewhat dysarthric.  Pertinent  Medical History   Past Medical History:  Diagnosis Date  . 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 Chronic viral hepatitis B without delta-agent ([place])   . Cirrhosis of liver ([place])   . Fibroid 2004, 2016  . Hemorrhage anterior chamber eye   . Hepatitis B infection   . Osteoarthritis    . Osteoporosis   . Prediabetes     Significant Hospital Events: Including procedures, antibiotic start and stop dates in addition to other pertinent events   5/8-admitted from [facility] rehabilitation. (After cardiac arrest)  Interim History / Subjective:  Sedated, unconscious. Received some IVF overnight for elevated LA.  Objective   Blood pressure 95/81, pulse 100, temperature 98.42 F (36.9 C), resp. rate (!) 22, height 5\' 3"  (1.6 m), weight 45.2 kg, last menstrual period 11/10/2006, SpO2 100 %.    Vent Mode: PRVC FiO2 (%):  [40 %] 40 % Set Rate:  [20 bmp]  20 bmp Vt Set:  [420 mL] 420 mL PEEP:  [5 cmH20] 5 cmH20 Plateau Pressure:  [11 cmH20-15 cmH20] 15 cmH20   Intake/Output Summary (Last 24 hours) at 03/19/2021 0719 Last data filed at 03/19/2021 0700 Gross per 24 hour  Intake 4399.83 ml  Output 1865 ml  Net 2534.83 ml   Filed Weights   03/18/21 0500 03/19/21 0500  Weight: 35.9 kg 45.2 kg    Examination: General: Intubated, sedated HENT: Pupils are symmetric, in normal size and reactive to light Lungs: Intubated and on ventilator: PRVC, 40%, 5, RR 20, TV 420 ml, Rhonchi/rale at rt middle and lower lobs* Cardiovascular: Irregular rhythm, mild tachycardia, no murmur Abdomen: Soft, non distended Extremities: no edema. No myoclonus movement Neuro: Sedated. Unresponsive. No reaction to painful stimuli, Pupils: nl size bilaterally, reactive to light, gag reflex  GU: Foley cath in place. 70 ml urine this AM  Labs/imaging that I havepersonally reviewed  (right click and "Reselect all SmartList Selections" daily)   Elevated LA   CMP Latest Ref Rng & Units 03/19/2021 03/18/2021 03/31/2021  Glucose 70 - 99 mg/dL 152(H) 169(H) -  BUN 8 - 23 mg/dL 20 17 -  Creatinine 0.44 - 1.00 mg/dL 0.50 0.57 -  Sodium 135 - 145 mmol/L 145 143 147(H)  Potassium 3.5 - 5.1 mmol/L 4.0 3.5 3.2(L)  Chloride 98 - 111 mmol/L 112(H) 111 -  CO2 22 - 32 mmol/L 24 21(L) -  Calcium 8.9 - 10.3 mg/dL 8.2(L) 7.9(L) -  Total Protein  6.5 - 8.1 g/dL - - -  Total Bilirubin 0.3 - 1.2 mg/dL - - -  Alkaline Phos 38 - 126 U/L - - -  AST 15 - 41 U/L - - -  ALT 0 - 44 U/L - - -   CBC Latest Ref Rng & Units 03/19/2021 03/18/2021 Mar 23, 2021  WBC 4.0 - 10.5 K/uL 13.9(H) 13.0(H) -  Hemoglobin 12.0 - 15.0 g/dL 13.9 14.5 13.3  Hematocrit 36.0 - 46.0 % 43.1 45.7 39.0  Platelets 150 - 400 K/uL 198 172 -   Resolved Hospital Problem list   NA Assessment & Plan:  75 Y/O with falls, possible Parkinson's, cerebellar ataxia Admitted with cardiac arrest after choking episode, 20 mins to  ROSC, on normothermia protocol, noepi, vent, deeply sedated.   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 CXR showed aspiration PNA at left side.  On norepinephrine, and on propofol for seizure control. Lactic acid elevated from 2.5 to 4.2 yesterday.  -Brain MRI -Titrate norepinephrine to keep MAP greater than 65 - fluid resuscitation w LR 75 ml/h. Received 500 ml bolus for elevated LA from 2.5 to 4.1overnight. Will give another 500 ml LR bolus given low MAP and low urine output today. -Temperature goal: normothermia postcardiac arrest care. Had T 98.8 overnight and placed on cooling. Holding now and will recheck Temp. -Plan is getting MRI today and then DC propofol to reassess neurology exam. This has been per family decision and shared decision making.  -No more myoclonic movement but per neurology, EEG could be suggestive of some seizure activity -Continue Keppra. -PRN Ativan for seizure -Continue full ventilatory support.  At risk for developing ARDS. -Continue Unasyn for antibiotic coverage.  Aspiration PNA: -On Unasyn  GOC discussion: Prognosis is poor for meaningful recovery as she already has signs of significant anoxic injury. We had another family meeting with husband and 2 daughters. Dr. Hortense Ramal, neurologist discussed patient's condition and prognosis.  Per decision by family, will stop sedation after getting MRI today. Family would like to see how her mental status will be without sedation before further decision. Full scope of care but they would like to change code status to DNR. They also believe that patient will not want staying in LTAC or tracheostomy.  -DNR -Full scope of treatment  Afib: Patient with Hx of persistent atrial fibrilation s/p DCCV, intolerant of Flecainide, she used to be on [facility] but it was stopped last hospitalization (4/24-03/12/2021) and after  rt. apical hemothorax 2/2 fall. (She has Hx of recurrent fall). She was on Diltiazem at home for rate control. Per family, patient used to be on rhythm control as well but per chart review she stopped that given no improvement. Will hold off of AC today. Her HR were around 90s off of rate control and on Levo yesterday.  ADDENDUM: HR up to 140 this AM. (brief RVR) Will start Amiodarone. Also given Bolus to support BP. -Amidoaron -On cardiac monitoring  Best practice (right click and "Reselect all SmartList Selections" daily)  Diet:  Tube Feed  Pain/Anxiety/Delirium protocol (if indicated): Yes (RASS goal -1) VAP protocol (if indicated): Yes DVT prophylaxis: Subcutaneous Heparin and LMWH GI prophylaxis: PPI Glucose control:  SSI No Central venous access:  N/A Arterial line:  N/A Foley:  Yes, and it is still needed Mobility:  bed rest  PT consulted: N/A Last date of multidisciplinary goals of care discussion [5/8] Code Status:  full code. Pending another discussion w family today Disposition: Remains in ICU  Critical care time:    Dewayne Hatch, MD [id number] 03/19/2021, 7:19 AM Pager: [phone]  Attending note: I have seen and examined the patient. History, labs and imaging reviewed.  75 Y/O with falls, possible Parkinson's, cerebellar ataxia Admitted with cardiac arrest after choking episode, 20 mins to ROSC, on normothermia protocol, noepi, vent, deeply sedated.   Getting IVF for elevated lactic acid. Mental status remains poor Awaiting MRI  Blood pressure (!) 91/43, pulse 98, temperature (!) 96.8 F (36 C), resp. rate (!) 22, height 5\' 3"  (1.6 m), weight 45.2 kg, last menstrual period 11/10/2006, SpO2 100 %. Gen:      No acute distress HEENT:  EOMI, sclera anicteric Neck:     No masses; no thyromegaly, ETT Lungs:    Clear to auscultation bilaterally; normal respiratory effort CV:         Regular rate and rhythm; no murmurs Abd:      + bowel sounds; soft, non-tender;  no palpable masses, no distension Ext:    No edema; adequate peripheral perfusion Skin:      Warm and dry; no rash Neuro: Unresponsive  Labs/Imaging personally reviewed, significant for Lactic acid 4.7, WBC 13.9 No new imaging   Assessment/plan: Post cardiac arrest Myoclonic seizures portends a poor prognosis s/p normothermia protocol Follow neuro prognosis Continue levo, wean as tolerated Keppra  Acute respiratory Aspiration pneumonia On unasyn.  Goals of care Palliative care has been consulted Ongoing discussions with family. She has been made DNR. Prognosis is poor for meaningful recovery as she already has signs of significant anoxic injury  The patient is critically ill with multiple organ systems failure and requires high complexity decision making for assessment and support, frequent evaluation and titration of therapies, application of advanced monitoring technologies and extensive interpretation of multiple databases.  Critical care time - 35 mins. This represents my time independent of the NPs time taking care of the pt.  Marshell Garfinkel MD [facility] 03/19/2021, 9:49 AM

## 2021-03-19 NOTE — Progress Notes (Signed)
Subjective: No acute events overnight.  Patient's daughters and husband at bedside  ROS: Unable to obtain due to poor mental status  Examination  Vital signs in last 24 hours: Temp:  [95 F (35 C)-98.6 F (37 C)] 97.7 F (36.5 C) (05/10 1100) Pulse Rate:  [55-175] 132 (05/10 1104) Resp:  [12-25] 21 (05/10 1104) BP: (86-140)/(43-114) 140/113 (05/10 1104) SpO2:  [95 %-100 %] 100 % (05/10 1104) FiO2 (%):  [40 %] 40 % (05/10 1104) Weight:  [45.2 kg] 45.2 kg (05/10 0500)  General: lying in bed, not in apparent distress CVS: pulse-normal rate and rhythm RS: Intubated, coarse breath sounds bilaterally Extremities: Cold, erythema in left hand Neuro: Comatose, does not open eyes or stimuli, PERRLA, corneal reflex intact, gag Intact, withdraws to noxious stimuli in left upper and left lower extremity, no movement in right upper and right lower extremity  Basic Metabolic Panel: Recent Labs  Lab 03/13/21 0847 04/06/2021 1056 03/22/2021 1208 03/29/2021 1409 03/15/2021 1655 03/18/21 0648 03/18/21 2003 03/19/21 0133  NA 141 144 147*  --   --  143  --  145  K 3.7 4.0 3.2*  --   --  3.5  --  4.0  CL 103 111  --   --   --  111  --  112*  CO2 30 13*  --   --   --  21*  --  24  GLUCOSE 132* 319*  --   --   --  169*  --  152*  BUN 13 20  --   --   --  17  --  20  CREATININE 0.53 0.85  --   --   --  0.57  --  0.50  CALCIUM 8.8* 8.1*  --   --   --  7.9*  --  8.2*  MG  --   --   --  1.9 1.9 2.0 1.9  --   PHOS  --   --   --  2.6 1.6* 3.0 2.9  --     CBC: Recent Labs  Lab 04/09/2021 1056 04/09/2021 1208 03/18/21 1449 03/19/21 0133  WBC 8.4  --  13.0* 13.9*  NEUTROABS 6.7  --   --   --   HGB 14.4 13.3 14.5 13.9  HCT 48.2* 39.0 45.7 43.1  MCV 114.2*  --  106.8* 105.4*  PLT 255  --  172 198     Coagulation Studies: No results for input(s): LABPROT, INR in the last 72 hours.  Imaging MRI brain ordered and pending  ASSESSMENT AND PLAN: 75 year old female status post cardiac  arrest.  Cardiac arrest Suspected anoxic/hypoxic brain injury Myoclonic seizures -LTM EEG continues to show burst suppression with highly epileptiform discharges  Recommendations: -Continue Keppra 1000 mg twice daily -Plan for MRI brain look for anoxic/hypoxic brain injury -We will stop propofol after MRI brain to watch for any neurological improvement -Updated patient's daughters and husband that I suspect patient has suffered anoxic/hypoxic brain injury with minimal to no chances of meaningful neurologic recovery.  Patient's family would like patient to DNR.  We will watch for 72 hours off sedation for neurological improvement.  If no improvement by then, family will consider transition to comfort care -Continue precautions -As needed IV Ativan for clinical seizure-like activity -Management of rest of comorbidities per primary team  CRITICAL CARE Performed by: Lora Havens  Total critical care time: 35 minutes  Critical care time was exclusive of separately billable procedures and treating  other patients.  Critical care was necessary to treat or prevent imminent or life-threatening deterioration.  Critical care was time spent personally by me on the following activities: development of treatment plan with patient and/or surrogate as well as nursing, discussions with consultants, evaluation of patient's response to treatment, examination of patient, obtaining history from patient or surrogate, ordering and performing treatments and interventions, ordering and review of laboratory studies, ordering and review of radiographic studies, pulse oximetry and re-evaluation of patient's condition.    Zeb Comfort Epilepsy [facility] For questions after 5pm please refer to AMION to reach the Neurologist on call

## 2021-03-19 NOTE — Progress Notes (Signed)
    Progress Note from the Palliative Medicine Team at [facility]   Patient Name: Connie Chase       Date: 03/19/2021 DOB: [birth year]  Age: 75 y.o. MRN#: [id number] Attending Physician: Marshell Garfinkel, MD Primary Care Physician: Jonathon Jordan, MD Admit Date: 03/15/2021   Medical records reviewed   75 y.o. female   admitted on 03/13/2021  having suffered a cardiac arrestsecondary to food aspiration while eating.   Immediate bystander CPR/ reside in a SNF.  ROSC after 20 minutes of resuscitation in the field. Intubated in the ED for airway protection minimal extension to pain prior to sedation administration. Food particles visible in posterior valleculae.  She was recently transitioned to SNF for rehab  Patient has an ill-defined neurological process for which she was in rehabilitation and apparently improving. 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  This NP visited patient at the bedside as a follow up for palliative medcien needs and emotional support.  Patient's husband and 2 daughters are at the bedside.  I had met Mr. Chan on Sunday and today I introduced myself to the patient's 2 daughters, and the role of palliative medicine in the patient's treatment plan.  Education offered on the patient's current medical situation.  Questions and concerns addressed.  Family understand the seriousness of the current medical situation and the likely significant brain injury secondary to an anoxic event.  Dr. Hortense Ramal updated the family today and the likelihood that the patient has an anoxic brain injury with very poor chances of meaningful neurologic recovery.  Plan is to wean patient off of sedation and observe/watchful waiting.  Education offered as we discussed best case scenario versus worst-case  scenario.  Ultimately the family does not want the patient to suffer however they need time see how she will do off of sedation as they come to terms with their personal  sense of knowing "we have done everything that we can and should do for her".  Patient's husband speaks to his love and appreciation of his wife.  They have been married for 52 years.  His daughters tell me that they have learned more about their parents relationship in the last 2 days as his father shares stories of happy times with his wife.  This nurse practitioner will follow-up in the morning continuing to support family as they navigate treatment option and advance care planning decisions.    Questions and concerns addressed   Discussed with Dr Jackolyn Confer and bedside RN Providence Lanius  Total time spent on the unit was 35 minutes  Greater than 50% of the time was spent in counseling and coordination of care  Wadie Lessen NP  Palliative Medicine Team Team Phone # [phone] Pager [phone]

## 2021-03-19 NOTE — Procedures (Addendum)
Patient Name:Connie Chase VEH:[id number] Epilepsy Attending:Sedra Morfin [name] Referring Physician/Provider:Dr Kipp Brood Duration:03/18/2021 1651 to 03/19/2021 1002  Patient history:74yo F s/p cardiac arrest. EEG to evaluate for seizure  Level of alertness:comatose  AEDs during EEG study:Propofol, LEV  Technical aspects: This EEG study was done with scalp electrodes positioned according to the 10-20 International system of electrode placement. Electrical activity was acquired at a sampling rate of 500Hz  and reviewed with a high frequency filter of 70Hz  and a low frequency filter of 1Hz . EEG data were recorded continuously and digitally stored.   Description: EEGalsoshowed burst suppression pattern with highly epileptiform bursts lasting 3 to 5 seconds alternating with EEG suppression lasting 8 to 12 seconds. EEG was not reactive to tactile stimulation.  ABNORMALITY - Burst suppression with highly epileptiform discharges, generalized  IMPRESSION: This study showed evidence of generalized epileptogenicity with high potential for seizures as we all profound diffuse encephalopathy. 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  [name] [name]

## 2021-03-19 NOTE — Progress Notes (Signed)
vLTM EEG complete.  Skin breakdown noted at EKG sites.  No other skin breakdown observed.

## 2021-03-19 NOTE — Progress Notes (Signed)
Patient's HR sustained in the 150s, Dr. Vaughan Browner paged & ordered Amiodarone bolus, which had little to not effect. Patient also fevering & tylenol ordered.   Paged Dr. Vaughan Browner again and received orders to resume sedation due to vent-desynchrony & heart rate remaining in the 140s.  RN made family aware of needing to restart sedation.   Connie Chase

## 2021-03-19 NOTE — Progress Notes (Signed)
Pharmacy Electrolyte Replacement  Recent Labs:  Recent Labs    03/18/21 2003 03/19/21 0133  K  --  4.0  MG 1.9  --   PHOS 2.9  --   CREATININE  --  0.50    Low Critical Values (K </= 2.5, Phos </= 1, Mg </= 1) Present: None  MD Contacted: N/A  Plan:EEG with highly epileptiform discharges, rising lactic acid and  Mag 1.9. SCr is back to baseline, UOP 1.7 mL/kg/hr, and no replacement ordered. Replace Magnesium 2g IV x1 today.   Sloan Leiter, PharmD, BCPS, BCCCP Clinical Pharmacist Please refer to [facility] for [place] numbers 03/19/2021, 7:11 AM

## 2021-03-19 NOTE — Progress Notes (Addendum)
Upon assessment pt noted to have decerebrate posturing with stimulation. Family concerned that pt looks to be in pain so low dose Fentanyl started. Pts HR 120s-140 and RR up with pt irregular breathing pattern and biting down on ETT. Pt eyes deviating upward.   While assessing IV's it was noted that the IV connection that had the pts sedation infusing had a crack and sedation/drips were leaking into bed. IV still able to flush so "J-loop" connection exchanged and no leaking noted after. After IV connection fixed and about 30 minutes passed pts HR now in the 80's, RR down to 18 and pt looks comfortable.

## 2021-03-19 NOTE — Progress Notes (Signed)
[facility] Progress Note Patient Name: Connie Chase DOB: 1945/11/11 MRN: [id number]   Date of Service  03/19/2021  HPI/Events of Note  Notified of rising lactate.  Lactic acid was 2.5 --> 4.1.  Pt is on levophed with MAP >65.  Pt with only 220cc of urine output over the last 10 hours.   eICU Interventions  Give 500cc NS bolus.  Continue to keep MAP >65. Continue to trend lactate.     Intervention Category Intermediate Interventions: Diagnostic test evaluation  Elsie Lincoln 03/19/2021, 3:24 AM

## 2021-03-19 NOTE — Progress Notes (Signed)
RT assisted with transportation of this pt from 2M4 to MRI and back to 2M4 while on full ventilatory support. Pt tolerated well with SVS. RN at bedside at this time.

## 2021-03-20 DIAGNOSIS — G931 Anoxic brain damage, not elsewhere classified: Secondary | ICD-10-CM | POA: Diagnosis not present

## 2021-03-20 DIAGNOSIS — Z515 Encounter for palliative care: Secondary | ICD-10-CM | POA: Diagnosis not present

## 2021-03-20 DIAGNOSIS — Z66 Do not resuscitate: Secondary | ICD-10-CM | POA: Diagnosis not present

## 2021-03-20 LAB — GLUCOSE, CAPILLARY
Glucose-Capillary: 125 mg/dL — ABNORMAL HIGH (ref 70–99)
Glucose-Capillary: 167 mg/dL — ABNORMAL HIGH (ref 70–99)
Glucose-Capillary: 112 mg/dL — ABNORMAL HIGH (ref 70–99)
Glucose-Capillary: 168 mg/dL — ABNORMAL HIGH (ref 70–99)
Glucose-Capillary: 157 mg/dL — ABNORMAL HIGH (ref 70–99)
Glucose-Capillary: 102 mg/dL — ABNORMAL HIGH (ref 70–99)

## 2021-03-20 LAB — CBC
HCT: 38.9 % (ref 36.0–46.0)
Hemoglobin: 13.1 g/dL (ref 12.0–15.0)
MCH: 34.3 pg — ABNORMAL HIGH (ref 26.0–34.0)
MCHC: 33.7 g/dL (ref 30.0–36.0)
MCV: 101.8 fL — ABNORMAL HIGH (ref 80.0–100.0)
Platelets: DECREASED 10*3/uL (ref 150–400)
RBC: 3.82 MIL/uL — ABNORMAL LOW (ref 3.87–5.11)
RDW: 13.3 % (ref 11.5–15.5)
WBC: 11.2 10*3/uL — ABNORMAL HIGH (ref 4.0–10.5)
nRBC: 0 % (ref 0.0–0.2)

## 2021-03-20 LAB — BASIC METABOLIC PANEL
Anion gap: 6 (ref 5–15)
BUN: 17 mg/dL (ref 8–23)
CO2: 22 mmol/L (ref 22–32)
Calcium: 7.8 mg/dL — ABNORMAL LOW (ref 8.9–10.3)
Chloride: 112 mmol/L — ABNORMAL HIGH (ref 98–111)
Creatinine, Ser: 0.42 mg/dL — ABNORMAL LOW (ref 0.44–1.00)
GFR, Estimated: >60 mL/min (ref >60)
Glucose, Bld: 160 mg/dL — ABNORMAL HIGH (ref 70–99)
Potassium: 3.8 mmol/L (ref 3.5–5.1)
Sodium: 140 mmol/L (ref 135–145)

## 2021-03-20 MED ORDER — ACETAMINOPHEN 325 MG PO TABS: 650.0000 mg | ORAL_TABLET | ORAL | Status: DC | PRN

## 2021-03-20 MED ORDER — POLYETHYLENE GLYCOL 3350 17 G PO PACK: 17.0000 g | PACK | Freq: Every day | ORAL | Status: DC | PRN

## 2021-03-20 MED ORDER — PANTOPRAZOLE SODIUM 40 MG PO PACK
40.0000 mg | PACK | Freq: Every day | ORAL | Status: DC
  Administered 2021-03-20 – 2021-03-21 (×2): 40 mg
  Filled 2021-03-20 (×2): qty 20

## 2021-03-20 NOTE — Progress Notes (Signed)
    Progress Note from the Palliative Medicine Team at [facility]   Patient Name: Connie Chase        Date: 03/20/2021 DOB: 1946/03/09  Age: 75 y.o. MRN#: [id number] Attending Physician: Marshell Garfinkel, MD Primary Care Physician: Jonathon Jordan, MD Admit Date: 03/12/2021   Medical records reviewed   75 y.o. female   admitted on 04/01/2021 suffered a cardiac arrestsecondary to food aspiration while eating.   Immediate bystander CPR/ reside in a SNF, her husband was at the bedsid.  ROSC after 20 minutes of resuscitation in the field. Intubated in the ED for airway protection minimal  pain response prior to sedation administration. Food particles visible in posterior valleculae.  She was recently transitioned to SNF for short term rehab  Patient has an ill-defined neurological process for which she was in rehabilitation and apparently improving. 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  MRI brain withoutcontrast 03/19/2021 --suggestive of  acute hypoxic/ischemic injury and  acute embolic-type infarcts  Currently intubated.   Family face treatment option decisions and anticipatory care needs.  This NP visited patient at the bedside as a follow up for palliative medcien needs and emotional support.   I have talked with  Mr. Vallarie Mare and his two daughters over the past few days.   Today I met with the daughters the bedside.  Family have had updates from neurology and CCM.    Family understand the seriousness of the current medical situation and the  significant brain injury secondary to an anoxic event and associated grim prognosis.  They continue to process the situation and struggle with how to handle the next steps.   Plan is for extubation tomorrow   Education offered on the process of one way extubation and EOL  care focusing on comfort and dignity.  Education offered on utilization of medication and the natural trajectory and expectations at EOL.    Discussed case with Dr Myrtie Hawk.  I will not be here in the hospital tomorrow but will ask my colleague to f/u with attending and family for palliative needs and family  support.  Total time spent on the unit was 35 minutes  Greater than 50% of the time was spent in counseling and coordination of care  Wadie Lessen NP  Palliative Medicine Team Team Phone # [phone] Pager [phone]

## 2021-03-20 NOTE — Progress Notes (Deleted)
    Progress Note from the Palliative Medicine Team at [facility]   Patient Name: Connie Chase       Date: 03-19-21 DOB: Mar 16, 1946  Age: 75 y.o. MRN#: [id number] Attending Physician: Marshell Garfinkel, MD Primary Care Physician: Jonathon Jordan, MD Admit Date: 03/15/2021   Medical records reviewed   75 y.o. female   admitted on 03/24/2021  having suffered a cardiac arrestsecondary to food aspiration while eating.   Immediate bystander CPR/ reside in a SNF.  ROSC after 20 minutes of resuscitation in the field. Intubated in the ED for airway protection minimal extension to pain prior to sedation administration. Food particles visible in posterior valleculae.  She was recently transitioned to SNF for rehab  Patient has an ill-defined neurological process for which she was in rehabilitation and apparently improving. 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  This NP visited patient at the bedside as a follow up for palliative medcien needs and emotional support.  Patient's husband and 2 daughters are at the bedside.  I had met Mr. Chan on Sunday and today I introduced myself to the patient's 2 daughters, and the role of palliative medicine in the patient's treatment plan.  Education offered on the patient's current medical situation.  Questions and concerns addressed.  Family understand the seriousness of the current medical situation and the likely significant brain injury secondary to an anoxic event.  Dr. Hortense Ramal updated the family today and the likelihood that the patient has an anoxic brain injury with very poor chances of meaningful neurologic recovery.  Plan is to wean patient off of sedation and observe/watchful waiting.  Education offered as we discussed best case scenario versus worst-case scenario.  Ultimately  the family does not want the patient to suffer however they need time see how she will do off of sedation as they come to terms with their personal  sense of knowing "we have done everything that we can and should do for her".  Patient's husband speaks to his love and appreciation of his wife.  They have been married for 52 years.  His daughters tell me that they have learned more about their parents relationship in the last 2 days as his father shares stories of happy times with his wife.  This nurse practitioner will follow-up in the morning continuing to support family as they navigate treatment option and advance care planning decisions.    Questions and concerns addressed   Discussed with Dr Jackolyn Confer and bedside RN [facility]ius  Total time spent on the unit was 35 minutes  Greater than 50% of the time was spent in counseling and coordination of care  Wadie Lessen NP  Palliative Medicine Team Team Phone # [phone] Pager [phone]

## 2021-03-20 NOTE — Progress Notes (Signed)
NAME:  Connie Chase, MRN:  [id number], DOB:  September 17, 1946, LOS: 3 ADMISSION DATE:  27-Mar-2021, CONSULTATION DATE:   Mar 27, 2021 REFERRING MD:  Johnney Killian, ED [facility], CHIEF COMPLAINT:  Post cardiac arrest.    History of Present Illness:  75 year old woman who suffered a cardiac arrest secondary to suffocation due to food aspiration while eating today.  Immediate bystander CPR.  ROSC after 20 minutes of resuscitation in the field.  Intubated in the ED for airway protection minimal extension to pain prior to sedation administration.  Food particles visible in posterior valleculae.  Patient has an ill-defined neurological process for which she was in rehabilitation and apparently improving.  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  According to her husband, she had occasion to choke on her food particular if she was eating fast.  Her speech had also become somewhat dysarthric.  Pertinent  Medical History   Past Medical History:  Diagnosis Date  . 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 Chronic viral hepatitis B without delta-agent ([place])   . Cirrhosis of liver ([place])   . Fibroid 2004, 2016  . Hemorrhage anterior chamber eye   . Hepatitis B infection   . Osteoarthritis    . Osteoporosis   . Prediabetes     Significant Hospital Events: Including procedures, antibiotic start and stop dates in addition to other pertinent events   5/8-admitted from [facility] rehabilitation. (After cardiac arrest)  Interim History / Subjective:  Sedated, unconsciousness.   Objective   Blood pressure 103/77, pulse 67, temperature (!) 96.44 F (35.8 C), resp. rate 20, height 5\' 3"  (1.6 m), weight 46.6 kg, last menstrual period 11/10/2006, SpO2 100 %.    Vent Mode: PRVC FiO2 (%):  [40 %] 40 % Set Rate:  [20 bmp] 20 bmp Vt Set:  [420 mL] 420 mL PEEP:   [5 cmH20] 5 cmH20 Plateau Pressure:  [10 cmH20-17 cmH20] 16 cmH20   Intake/Output Summary (Last 24 hours) at 03/20/2021 0743 Last data filed at 03/20/2021 0700 Gross per 24 hour  Intake 4076.51 ml  Output 740 ml  Net 3336.51 ml   Filed Weights   03/18/21 0500 03/19/21 0500 03/20/21 0500  Weight: 35.9 kg 45.2 kg 46.6 kg    Examination: Gen:      No acute distress Neck:     ETT Lungs:    Intubated, lungs are clear to auscultation bilaterally; on ventilator CV:         Irregular rhythm; no murmurs Abd:      Soft, no distension Ext:    No edema; adequate peripheral perfusion but cold due to being on cooling blanket Skin:      Warm and dry; no rash Neuro: Sedated, unresponsive   Labs/imaging that I havepersonally reviewed  (right click and "Reselect all SmartList Selections" daily)   WBC 11.2 MRI brain 5/10: with extensive diffusion-weighted signal abnormality within the supratentorial and infratentorial brain, as described and compatible with acute hypoxic/ischemic injury. And numerous small well demarcated foci of restricted diffusion within the bilateral cerebellar hemispheres. Additional subcentimeter focus of restricted diffusion within the left caudate nucleus. Findings are suspicious for superimposed acute embolic-type infarcts.  Resolved Hospital Problem list   NA Assessment & Plan:  75 Y/O with falls, possible Parkinson's, cerebellar ataxia Admitted with cardiac arrest after choking episode, 20 mins to  ROSC, on normothermia protocol, noepi, vent, deeply sedated.   Getting IVF for elevated lactic acid. Mental status remains poor. [place] pending family decision.  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 CXR showed aspiration PNA at left side.  On norepinephrine, and on propofol for seizure control. Sedation stopped yesterday to reassess neurologic status  and per family decision, how ever patient became uncomfortable (and had probable seizure so placed on sedation again per family request) She had fever yesterday. Could be 2/2 aspiration PNA. She is on Unasyn. Leukocytosis improving (11.2 today from 13.9 yesterday). T improved with cooling blanket. We stop it now as patient temp is lower than target now.  -Brain MRI 5/10 showed  hypoxic/ischemic injury and some acute embolic-type infarcts. -Will continue current clinical management for now. GOC pending family decision. - On Levophed.Titrateto keep MAP greater than 65 - fluid resuscitation w LR 75 ml/h  -Temperature goal: normothermia postcardiac arrest care -On propofol for seizure control. Will likely decrease sedation as much as she remians comfortable but less sedated.  -Continue Keppra. -PRN Ativan for seizure -Continue full ventilatory support.  At risk for developing ARDS. -Continue Unasyn for antibiotic coverage. -Unfortunately prognosis is poor for meaningful recovery as she already has signs of significant anoxic injury  Aspiration PNA: -On Unasyn  GOC discussion: Prognosis is poor for meaningful recovery as she already has signs of significant anoxic injury. This has been discussed with family by Dr. Hortense Ramal, neurologist and PCCM team.  5/10: Per decision by family, sedation stopped briefly to assess mental and neurologic status but then resumed due to patient being uncomfortable vs developed seizure. Family decided DNR for patient yesterday and they also state that patient will not want staying in LTAC or tracheostomy. Further plan pending family discussion.  5/11: We talked to daughters in person today. All of their questions and concerns were addressed. Unfortunately poor prognosis for meaningful neurology recovery. They will talk to their father fto decide next step. Family are understandingly very sad. They are very reasonable but would like to have a chance to say good bye. We  discussed decreasing sedation. They agreed to titrate down the sedation as much as possible in case their mother becomes less sedated if this causes her to hear them.  -Pt is DNR -Currently full scope of treatment. Pending ongoing discussion with family.   Afib: Patient with Hx of persistent atrial fibrilation s/p DCCV, intolerant of Flecainide, she used to be on [facility] but it was stopped last hospitalization (4/24-03/12/2021) and after rt. apical hemothorax 2/2 fall. (She has Hx of recurrent fall). She was on Diltiazem at home for rate control. Per family, patient used to be on rhythm control as well but per chart review she stopped that given no improvement. Had episodes of RVR here in hospital and started on Amiodarone 5/10. Not on BB or CCB here due to shock and post cardiac arrest. Rate is control at ~70s now.   -Continue Amidoaron -On cardiac monitoring  Best practice (right click and "Reselect all SmartList Selections" daily)  Diet:  Tube Feed  Pain/Anxiety/Delirium protocol (if indicated): Yes (RASS goal -1) VAP protocol (if indicated): Yes DVT prophylaxis: Subcutaneous Heparin and LMWH GI prophylaxis: PPI Glucose control:  SSI No Central venous access:  N/A Arterial line:  N/A Foley:  Yes, and it is still needed Mobility:  bed rest  PT consulted: N/A Last date of multidisciplinary goals of  care discussion [5/8] Code Status:  full code. Pending another discussion w family today Disposition: Remains in ICU  Critical care time:    Dewayne Hatch, MD IM-PGY3 03/20/2021, 7:43 AM Pager: [phone]

## 2021-03-20 NOTE — Progress Notes (Signed)
Subjective: Had to be back on propofol overnight due to possible seizure, respiratory discomfort  ROS: Unable to obtain due to poor mental status  Examination  Vital signs in last 24 hours: Temp:  [95 F (35 C)-102.4 F (39.1 C)] 97.34 F (36.3 C) (05/11 1000) Pulse Rate:  [63-151] 78 (05/11 1000) Resp:  [14-28] 14 (05/11 1000) BP: (72-151)/(52-127) 110/82 (05/11 1000) SpO2:  [85 %-100 %] 95 % (05/11 1000) FiO2 (%):  [40 %] 40 % (05/11 0715) Weight:  [46.6 kg] 46.6 kg (05/11 0500)  General: lying in bed, not in apparent distress CVS: pulse-normal rate and rhythm RS: Intubated, coarse breath sounds bilaterally Extremities: Cold, erythema in left hand Neuro: On Propofol@30mcg /hr. Comatose, does not open eyes or stimuli, PERRLA, corneal reflex absent, gag Intact, doesn't withdraw to noxious stimuli in all lower extremities  Basic Metabolic Panel: Recent Labs  Lab 01-Apr-2021 1056 04/01/21 1208 04-01-21 1409 2021/04/01 1655 03/18/21 0648 03/18/21 2003 03/19/21 0133 03/20/21 0240  NA 144 147*  --   --  143  --  145 140  K 4.0 3.2*  --   --  3.5  --  4.0 3.8  CL 111  --   --   --  111  --  112* 112*  CO2 13*  --   --   --  21*  --  24 22  GLUCOSE 319*  --   --   --  169*  --  152* 160*  BUN 20  --   --   --  17  --  20 17  CREATININE 0.85  --   --   --  0.57  --  0.50 0.42*  CALCIUM 8.1*  --   --   --  7.9*  --  8.2* 7.8*  MG  --   --  1.9 1.9 2.0 1.9  --   --   PHOS  --   --  2.6 1.6* 3.0 2.9  --   --     CBC: Recent Labs  Lab 2021-04-01 1056 04/01/21 1208 03/18/21 1449 03/19/21 0133 03/20/21 0240  WBC 8.4  --  13.0* 13.9* 11.2*  NEUTROABS 6.7  --   --   --   --   HGB 14.4 13.3 14.5 13.9 13.1  HCT 48.2* 39.0 45.7 43.1 38.9  MCV 114.2*  --  106.8* 105.4* 101.8*  PLT 255  --  172 198 PLATELET CLUMPS NOTED ON SMEAR, COUNT APPEARS DECREASED     Coagulation Studies: No results for input(s): LABPROT, INR in the last 72 hours.  Imaging MRI brain without contrast  03/19/2021: Extensive diffusion-weighted signal abnormality within the supratentorial and infratentorial brain, as described and compatible with acute hypoxic/ischemic injury. Numerous small well demarcated foci of restricted diffusion within the bilateral cerebellar hemispheres. Additional subcentimeter focus of restricted diffusion within the left caudate nucleus. Findings are suspicious for superimposed acute embolic-type infarcts     ASSESSMENT AND PLAN: 75 year old female status post cardiac arrest.  Cardiac arrest Anoxic/hypoxic brain injury Myoclonic seizures ( resolved) -Patient's history, exam, EEG and MRI findings are consistent with diffuse anoxic/hypoxic brain injury  Recommendations: -Continue Keppra 1000 mg twice daily -Patient has suffered severe neurologic injury with minimal to no chances of meaningful neurologic recovery.  Family understands the diagnosis and are planning on transitioning to comfort care. -Continue precautions -As needed IV Ativan for clinical seizure-like activity -Management of rest of comorbidities per primary team  I have spent a total of 35  minutes  with the patient reviewing hospital notes,  test results, labs and examining the patient as well as establishing an assessment and plan.  > 50% of time was spent in direct patient care.    Zeb Comfort Epilepsy [facility] For questions after 5pm please refer to AMION to reach the Neurologist on call

## 2021-03-20 NOTE — Progress Notes (Incomplete)
Attending note: I have seen and examined the patient. History, labs and imaging reviewed.    Blood pressure 103/79, pulse 67, temperature (!) 96.62 F (35.9 C), resp. rate 20, height 5\' 3"  (1.6 m), weight 46.6 kg, last menstrual period 11/10/2006, SpO2 100 %. Gen:      No acute distress HEENT:  EOMI, sclera anicteric Neck:     No masses; no thyromegaly Lungs:    Clear to auscultation bilaterally; normal respiratory effort*** CV:         Regular rate and rhythm; no murmurs Abd:      + bowel sounds; soft, non-tender; no palpable masses, no distension Ext:    No edema; adequate peripheral perfusion Skin:      Warm and dry; no rash Neuro: alert and oriented x 3 Psych: normal mood and affect   Labs/Imaging personally reviewed, significant for   Assessment/plan:  The patient is critically ill with multiple organ systems failure and requires high complexity decision making for assessment and support, frequent evaluation and titration of therapies, application of advanced monitoring technologies and extensive interpretation of multiple databases.  Critical care time - 35 mins. This represents my time independent of the NPs time taking care of the pt.  Marshell Garfinkel MD [facility] 03/20/2021, 10:18 AM

## 2021-03-21 DIAGNOSIS — Z515 Encounter for palliative care: Secondary | ICD-10-CM | POA: Diagnosis not present

## 2021-03-21 DIAGNOSIS — Z66 Do not resuscitate: Secondary | ICD-10-CM | POA: Diagnosis not present

## 2021-03-21 DIAGNOSIS — Z7189 Other specified counseling: Secondary | ICD-10-CM | POA: Diagnosis not present

## 2021-03-21 DIAGNOSIS — J690 Pneumonitis due to inhalation of food and vomit: Secondary | ICD-10-CM | POA: Diagnosis not present

## 2021-03-21 LAB — BASIC METABOLIC PANEL
Anion gap: 6 (ref 5–15)
BUN: 12 mg/dL (ref 8–23)
CO2: 25 mmol/L (ref 22–32)
Calcium: 7.6 mg/dL — ABNORMAL LOW (ref 8.9–10.3)
Chloride: 108 mmol/L (ref 98–111)
Creatinine, Ser: 0.36 mg/dL — ABNORMAL LOW (ref 0.44–1.00)
GFR, Estimated: >60 mL/min (ref >60)
Glucose, Bld: 148 mg/dL — ABNORMAL HIGH (ref 70–99)
Potassium: 3.1 mmol/L — ABNORMAL LOW (ref 3.5–5.1)
Sodium: 139 mmol/L (ref 135–145)

## 2021-03-21 LAB — CBC
HCT: 33.2 % — ABNORMAL LOW (ref 36.0–46.0)
Hemoglobin: 11.1 g/dL — ABNORMAL LOW (ref 12.0–15.0)
MCH: 34 pg (ref 26.0–34.0)
MCHC: 33.4 g/dL (ref 30.0–36.0)
MCV: 101.8 fL — ABNORMAL HIGH (ref 80.0–100.0)
Platelets: 135 10*3/uL — ABNORMAL LOW (ref 150–400)
RBC: 3.26 MIL/uL — ABNORMAL LOW (ref 3.87–5.11)
RDW: 13.5 % (ref 11.5–15.5)
WBC: 6.2 10*3/uL (ref 4.0–10.5)
nRBC: 0 % (ref 0.0–0.2)

## 2021-03-21 LAB — GLUCOSE, CAPILLARY
Glucose-Capillary: 136 mg/dL — ABNORMAL HIGH (ref 70–99)
Glucose-Capillary: 153 mg/dL — ABNORMAL HIGH (ref 70–99)

## 2021-03-21 LAB — TRIGLYCERIDES: Triglycerides: 51 mg/dL (ref <150)

## 2021-03-21 MED ORDER — GLYCOPYRROLATE 0.2 MG/ML IJ SOLN: 0.3000 mg | INTRAMUSCULAR | Status: DC | PRN

## 2021-03-21 MED ORDER — MORPHINE SULFATE (PF) 2 MG/ML IV SOLN: 2.0000 mg | INTRAVENOUS | Status: DC | PRN

## 2021-03-21 MED ORDER — DIPHENHYDRAMINE HCL 50 MG/ML IJ SOLN: 25.0000 mg | INTRAMUSCULAR | Status: DC | PRN

## 2021-03-21 MED ORDER — BIOTENE DRY MOUTH MT LIQD: 15.0000 mL | OROMUCOSAL | Status: DC | PRN

## 2021-03-21 MED ORDER — ACETAMINOPHEN 650 MG RE SUPP: 650.0000 mg | Freq: Four times a day (QID) | RECTAL | Status: DC | PRN

## 2021-03-21 MED ORDER — DEXTROSE 5 % IV SOLN: INTRAVENOUS | Status: DC

## 2021-03-21 MED ORDER — POLYVINYL ALCOHOL 1.4 % OP SOLN
1.0000 [drp] | Freq: Four times a day (QID) | OPHTHALMIC | Status: DC | PRN
  Filled 2021-03-21: qty 15

## 2021-03-21 MED ORDER — POTASSIUM CHLORIDE 20 MEQ PO PACK
40.0000 meq | PACK | ORAL | Status: DC
  Administered 2021-03-21: 40 meq
  Filled 2021-03-21: qty 2

## 2021-03-21 MED ORDER — LORAZEPAM 2 MG/ML IJ SOLN
2.0000 mg | INTRAMUSCULAR | Status: DC
  Administered 2021-03-21 (×2): 2 mg via INTRAVENOUS
  Filled 2021-03-21 (×2): qty 1

## 2021-04-10 NOTE — Progress Notes (Signed)
Daily Progress Note   Patient Name: Connie Chase       Date: 2021-03-28 DOB: [birth year]  Age: 75 y.o. MRN#: [id number] Attending Physician: Marshell Garfinkel, MD Primary Care Physician: Jonathon Jordan, MD Admit Date: 03/27/2021  Reason for Consultation/Follow-up: Establishing goals of care  Subjective: Chart Reviewed. Updates Received. Patient Assessed.   No acute distress noted. Nursing staff turning and bathing. Remains intubated and sedated.   Lengthy discussion with family (husband and 2 daughters) regarding goals of care. Family is tearful expressing patient's wishes and awareness she would not want life prolonging measures. We discussed her quality of life in consideration to current illness (severe anoxic injury).   As requested, education provided on one-way extubation and comfort care. Family tearful. Emotional support provided.   Family has expressed clear wishes and goals to terminally extubate to comfort with awareness of anticipated hospital death. They verbalized understanding of discontinuation of medical interventions, expectations post extubation, and the use of medications with a focus on symptoms. Education also provided on post-mortem care and support once patient has passed away. Husband shares he has made funeral arrangements with [facility].   I created space and opportunity for husband and daughters to verbalized their feelings and share memories of patient. Mr. Thane expressed his grief and knowing she will not be with him much longer. Emotional support provided.   Spiritual support offered. Family declined but aware they may request at any time.   Family would like some additional time at the bedside to say their good-byes and love prior to extubation. They will notify the nursing staff once ready.   All questions answered and support provided.   Length of Stay: 4 days  Vital Signs: BP 93/64   Pulse 73   Temp (!) 97.34 F (36.3 C)   Resp 20   Ht 5\' 3"   (1.6 m)   Wt 48.2 kg   LMP 11/10/2006 (Approximate)   SpO2 97%   BMI 18.82 kg/m  SpO2: SpO2: 97 % O2 Device: O2 Device: Ventilator O2 Flow Rate:    Physical Exam: NAD, intubated/sedated Unresponsive  RRR No edema             Palliative Care Assessment & Plan    Code Status:  DNR  Goals of Care/Recommendations:  Updates provided at length regarding current illness, poor prognosis, and expectations during compassionate extubation/EOL.   Family mutually request one-way extubation to comfort. They have verbalized understanding of comfort care and anticipate hospital death. They would like to spend more time with patient prior to extubation. They plan to notify nurse once they are ready to proceed.   Once extubated to comfort, discontinue all medical interventions/orders/tube feeding not comfort focused.   Fentanyl drip for comfort, titrate as needed  Fentanyl infusion bolus as needed for air hunger/pain  Scheduled Ativant Robinul PRN for excessive secretions Zofran PRN for nausea Liquifilm tears PRN for dry eyes Haldol PRN for agitation/anxiety Comfort cart for family Unrestricted visitations in the setting of EOL (per policy) Extubate to Nasal Cannula PRN 2L or less for comfort. No escalation.   PMT will continue to support and follow as needed.   Prognosis: Hours - Days  Discharge Planning: Anticipated Hospital Death  Thank you for allowing the Palliative Medicine Team to assist in the care of this patient.  Time Total: 65 min.   Visit consisted of counseling and education dealing with the complex and emotionally intense issues of symptom management and palliative care in the setting  of serious and potentially life-threatening illness.Greater than 50%  of this time was spent counseling and coordinating care related to the above assessment and plan.  Alda Lea, AGPCNP-BC  Palliative Medicine Team [phone]

## 2021-04-10 NOTE — Progress Notes (Addendum)
NAME:  Connie Chase, MRN:  [id number], DOB:  [birth year], LOS: 4 ADMISSION DATE:  03/14/2021, CONSULTATION DATE:   03/25/2021 REFERRING MD:  Johnney Killian, ED [facility], CHIEF COMPLAINT:  Post cardiac arrest.    History of Present Illness:  75 year old woman who suffered a cardiac arrest secondary to suffocation due to food aspiration while eating today.  Immediate bystander CPR.  ROSC after 20 minutes of resuscitation in the field.  Intubated in the ED for airway protection minimal extension to pain prior to sedation administration.  Food particles visible in posterior valleculae.  Patient has an ill-defined neurological process for which she was in rehabilitation and apparently improving.  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  According to her husband, she had occasion to choke on her food particular if she was eating fast.  Her speech had also become somewhat dysarthric.  Pertinent  Medical History   Past Medical History:  Diagnosis Date  . 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 Chronic viral hepatitis B without delta-agent ([place])   . Cirrhosis of liver ([place])   . Fibroid 2004, 2016  . Hemorrhage anterior chamber eye   . Hepatitis B infection   . Osteoarthritis    . Osteoporosis   . Prediabetes     Significant Hospital Events: Including procedures, antibiotic start and stop dates in addition to other pertinent events   5/8-admitted from [facility] rehabilitation. (After cardiac arrest)  Interim History / Subjective:  Sedated, unconscious.   Objective   Blood pressure 97/67, pulse 73, temperature (!) 94.82 F (34.9 C), resp. rate 20, height 5' 3" (1.6 m), weight 48.2 kg, last menstrual period 11/10/2006, SpO2 99 %.    Vent Mode: PRVC FiO2 (%):  [40 %] 40 % Set Rate:  [20 bmp] 20 bmp Vt Set:  [420 mL] 420 mL PEEP:  [5  cmH20] 5 cmH20 Plateau Pressure:  [14 cmH20-17 cmH20] 17 cmH20   Intake/Output Summary (Last 24 hours) at 2021/03/23 1305 Last data filed at Mar 23, 2021 1259 Gross per 24 hour  Intake 2375.98 ml  Output 1215 ml  Net 1160.98 ml   Filed Weights   03/19/21 0500 03/20/21 0500 Mar 23, 2021 0310  Weight: 45.2 kg 46.6 kg 48.2 kg    Examination: Gen:       Sedated HEENT:  Pupils are symmetric and reactive to lights. Dolls eye Lungs:    Intubated, ronchi CV:         Irregular rate, no murmurs Abd:      + bowel sounds; soft, no distension Ext:    Trace edema Skin:      Warm and dry Neuro: Sedated, unresponsive   Labs/imaging that I havepersonally reviewed  (right click and "Reselect all SmartList Selections" daily)  Labs/Imaging personally reviewed, significant for: K 3.1  Resolved Hospital Problem list   NA Assessment & Plan:  75 Y/O with falls, possible Parkinson's, cerebellar ataxia Admitted with cardiac arrest after choking episode, 20 mins to ROSC, on normothermia protocol, noepi, vent, deeply sedated.   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 CXR showed aspiration PNA at left side.  LA was elevated at 8 on arrival. Improved Now off of levo.  Brain MRI 5/10 showed: Extensive diffusion-weighted signal abnormality within the supratentorial  and infratentorial brain, as described and compatible with acute hypoxic/ischemic injury. Also suspicious for superimposed acute embolic-type infarcts.  -Discontinued propofol today as planned and anticipating for extubation and comfort care later today. -Continue Keppra. -PRN Ativan for seizure -Continue Unasyn for antibiotic coverage. -K 3.1. Replaced. -Temperature goal: normothermia postcardiac arrest care. -Palliative is on board. Will likely extubate and comfort care today but pending final decision by family.   Goal  of care: Prognosis is poor for meaningful recovery as she already has signs of significant anoxic injury. We had another family meeting with husband and 2 daughters. Dr. Hortense Ramal, neurologist discussed patient's condition and prognosis.  Per decision by family, will stop sedation after getting MRI today. Family would like to see how her mental status will be without sedation before further decision. Full scope of care but they would like to change code status to DNR. They also believe that patient will not want staying in LTAC or tracheostomy.  -DNR w no aggressive measure -Pending final decision by family but so far, most likely, the plan is extubatin today  when all family members arrive. -Palliative care is on board. Appreciate recommendations.  Aspiration PNA: -On Unasyn   Afib: Patient with Hx of persistent atrial fibrilation s/p DCCV, intolerant of Flecainide, she used to be on [facility] but it was stopped last hospitalization (4/24-03/12/2021) and after rt. apical hemothorax 2/2 fall. (She has Hx of recurrent fall). She was on Diltiazem at home for rate control. Per family, patient used to be on rhythm control as well but per chart review she stopped that given no improvement. Will hold off of AC.  -[place] -On cardiac monitoring  Best practice (right click and "Reselect all SmartList Selections" daily)  Diet:  Tube Feed  Pain/Anxiety/Delirium protocol (if indicated): Yes (RASS goal -1) VAP protocol (if indicated): Yes DVT prophylaxis: Subcutaneous Heparin GI prophylaxis: PPI Glucose control:  SSI Yes Central venous access:  N/A Arterial line:  N/A Foley:  Yes, and it is still needed Mobility:  bed rest  PT consulted: N/A Last date of multidisciplinary goals of care discussion [5/8] Code Status:  DNR. Pending another discussion w family today Disposition: Remains in ICU  Critical care time:    Dewayne Hatch, MD [id number] 2021-04-20, 1:05 PM Pager: [phone]  Attending  note: I have seen and examined the patient. History, labs and imaging reviewed.  75 Y/O with falls, possible Parkinson's, cerebellar ataxia  Admitted with cardiac arrest after choking episode, 20 mins to ROSC, s/p normothermia protocol  Has severe anoxic injury with myoclonic seizures Family discussion ongoing regarding comfort measures  Blood pressure 93/64, pulse 73, temperature (!) 97.34 F (36.3 C), resp. rate 20, height 5' 3" (1.6 m), weight 48.2 kg, last menstrual period 11/10/2006, SpO2 97 %. Gen:      No acute distress, frail HEENT:  EOMI, sclera anicteric Neck:     No masses; no thyromegaly, ETT Lungs:    Clear to auscultation bilaterally; normal respiratory effort CV:         Regular rate and rhythm; no murmurs Abd:      + bowel sounds; soft, non-tender; no palpable masses, no distension Ext:    No edema; adequate peripheral perfusion Skin:      Warm and dry; no rash Neuro: Unresponsive  Labs/Imaging personally reviewed, significant for K 3.1 CBC is stable No new imaging  Assessment/plan: Post cardiac arrest  With severe anoxic injury  s/pnormothermia protocol  Follow neuro prognosis  Keppra    Acute  respiratory  Aspiration pneumonia  On unasyn.    Goals of care  We met with daughters and family yesterday  Reviewed clinical status.  They are clear that she would not want to be supported indefinitely. Likely extubation in the next 24 hrs when family is ready She is DNR now with no aggressive measures   The patient is critically ill with multiple organ systems failure and requires high complexity decision making for assessment and support, frequent evaluation and titration of therapies, application of advanced monitoring technologies and extensive interpretation of multiple databases.  Critical care time - 35 mins. This represents my time independent of the NPs time taking care of the pt.  Marshell Garfinkel MD [facility] Mar 25, 2021, 9:36  AM

## 2021-04-10 NOTE — Death Summary Note (Signed)
  DEATH SUMMARY   Patient Details  Name: Connie Chase MRN: [id number] DOB: January 16, 1946  Admission/Discharge Information   Admit Date:  Mar 27, 2021  Date of Death: Date of Death: 31-Mar-2021  Time of Death: Time of Death: 02-21-16  Length of Stay: 4  Referring Physician: Jonathon Jordan, MD   Reason(s) for Hospitalization   Cardiac arrest  Diagnoses  Preliminary cause of death:   Anoxic brain injury due to cardiac arrest, present on admission Severe encephalopathy secondary to anoxic brain injury Cardiac arrest Sepsis, present on admission Septic shock secondary to aspiration pneumonia Atrial fibrillation Severe malnutrition  Secondary Diagnoses (including complications and co-morbidities):  Active Problems:   Aspiration pneumonitis [facility]) Palliative consultation  Patient Active Problem List   Diagnosis Date Noted  . Aspiration pneumonitis ([place]) 03-27-2021  . Recurrent falls 03/13/2021  . Protein-calorie malnutrition, severe 03/05/2021  . Multiple rib fractures 03/04/2021  . Hemopneumothorax on left 03/04/2021  . Fall at home, initial encounter 03/04/2021  . Chronic anticoagulation 03/04/2021  . Parkinsonism ([place]) 06/14/2018  . Persistent atrial fibrillation ([place]) 03/27/2015  . Alopecia 02/09/2013  . Palpitations 06/13/2011  . Chest pain 06/13/2011  . ATRIAL FIBRILLATION 12/31/2009  . FATIGUE 12/31/2009    Brief Hospital Course (including significant findings, care, treatment, and services provided and events leading to death)  Connie Chase is a 75 y.o. year old female who suffered a cardiac arrestsecondary to suffocation due to food aspiration. Immediate bystander CPR. ROSC after 20 minutes of resuscitation in the field. Intubated in the ED for airway protection minimal extension to pain prior to sedation administration. Food particles visible in posterior valleculae.  Patient has an ill-defined neurological process for which she was in rehabilitation and apparently  improving. 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 Her speech had also become somewhat dysarthric.  In hospital she underwent normothermia protocol.  She had sepsis present on admission due to aspi. ration pneumonia for which she was on antibiotics and pressors.  Noted to be in rapid atrial fibrillation for which she got amiodarone. She had evidence of severe anoxic injury as evidenced by myoclonic seizures and MRI findings.  Neurology and palliative care were consulted Mental status did not improve on weaning sedation.  After discussion of the family the decision was made to transition to comfort measures.  She was terminally extubated on 04/01/2023 and passed away with family at bedside.  Marshell Garfinkel MD [facility] 03/22/2021, 11:40 AM

## 2021-04-10 NOTE — Progress Notes (Signed)
This chaplain responded to the Pt. RN-Autumn's page for family spiritual after the Pt. death.  The Pt. husband and two daughters are at the Pt. bedside.    The chaplain practiced reflective listening as the family asked questions about the experience of grief.  The chaplain reassured the family emotions are acceptable when learning how to live with a loss.  The chaplain affirmed the family unit and the Pt. 40 years of marriage to her husband.    The family was thankful for the chaplain's visit and the sharing [facility]'s contact information.

## 2021-04-10 NOTE — Procedures (Signed)
Extubation Procedure Note  Patient Details:   Name: Trinia Georgi DOB: 1946/07/26 MRN: [id number]   Airway Documentation:   Pt extubated d/t comfort care measures only. Family and RN at bedside at this time.  Vent end date: 03/31/2021 Vent end time: 1604   Evaluation  O2 sats: currently acceptable Complications: No apparent complications Patient did tolerate procedure well. Bilateral Breath Sounds: Clear,Diminished   No  Ander Purpura 03/28/2021, 4:04 PM

## 2021-04-10 DEATH — deceased
